# Patient Record
Sex: Male | Born: 1935 | Race: White | Hispanic: No | Marital: Married | Smoking: Former smoker
Health system: Southern US, Community
[De-identification: ages and names within clinical notes are randomized; demographics above are authoritative.]

## PROBLEM LIST (undated history)

## (undated) DIAGNOSIS — Z923 Personal history of irradiation: Secondary | ICD-10-CM

## (undated) DIAGNOSIS — Z5112 Encounter for antineoplastic immunotherapy: Secondary | ICD-10-CM

## (undated) DIAGNOSIS — C419 Malignant neoplasm of bone and articular cartilage, unspecified: Secondary | ICD-10-CM

## (undated) DIAGNOSIS — I10 Essential (primary) hypertension: Secondary | ICD-10-CM

## (undated) DIAGNOSIS — G834 Cauda equina syndrome: Secondary | ICD-10-CM

## (undated) DIAGNOSIS — C3490 Malignant neoplasm of unspecified part of unspecified bronchus or lung: Principal | ICD-10-CM

## (undated) HISTORY — DX: Cauda equina syndrome: G83.4

## (undated) HISTORY — DX: Malignant neoplasm of unspecified part of unspecified bronchus or lung: C34.90

## (undated) HISTORY — DX: Encounter for antineoplastic immunotherapy: Z51.12

## (undated) HISTORY — DX: Essential (primary) hypertension: I10

## (undated) HISTORY — DX: Malignant neoplasm of bone and articular cartilage, unspecified: C41.9

---

## 2004-08-11 ENCOUNTER — Ambulatory Visit (HOSPITAL_COMMUNITY): Admission: RE | Admit: 2004-08-11 | Discharge: 2004-08-11 | Payer: Self-pay | Admitting: General Surgery

## 2006-09-04 ENCOUNTER — Ambulatory Visit: Payer: Self-pay | Admitting: Cardiology

## 2006-09-13 ENCOUNTER — Ambulatory Visit: Payer: Self-pay

## 2006-10-18 ENCOUNTER — Ambulatory Visit: Payer: Self-pay | Admitting: Cardiology

## 2010-10-12 NOTE — Assessment & Plan Note (Signed)
[facility]                            CARDIOLOGY OFFICE NOTE   TYRECK, BELL                          MRN:          [id number]  DATE:10/18/2006                            DOB:          Nov 21, 1934    Mr. Logan Russell is a 76 year old gentleman that I recently saw on September 04, 2006 secondary to atypical chest pain.  We scheduled him to have a  Myoview which was performed on September 13, 2006.  There appeared to be  minimal diaphragmatic attenuation but no significant ischemia.  His  ejection fraction was 60%.  He had an abdominal ultrasound that showed  no significant aneurysm.  He had carotid Dopplers that showed a 0%-39%  bilateral internal carotid artery stenosis.  Since that time he  continues to have an occasional pain in his chest that lasts for  seconds.  It is not exertional nor is it related to food.  There is no  associated symptoms.  Note, he does not have exertional chest pain.  He  also denies any dyspnea or pedal edema.  He has not taken his atenolol  in several days as he states he ran out.   MEDICATIONS:  1. Hydrochlorothiazide 25 mg p.o. daily.  2. Aspirin 81 mg p.o. daily.   PHYSICAL EXAMINATION:  Blood pressure of 170/95 and his pulse is 82.  He  weighs 208 pounds.  His HEENT is normal.  His neck is supple.  His chest is clear.  His cardiovascular exam reveals a regular rate and rhythm.  His abdominal exam is benign.  His extremities show no edema.   DIAGNOSES:  1. Atypical chest pain:  He does have persistent symptoms that last      for seconds, but these do not sound to be cardiac.  His Myoview      shows no ischemia.  I, therefore, do not think we need to pursue      further cardiac workup.  2. Hypertension:  His blood pressure is elevated today but he is not      taking his atenolol.  We will resume atenolol at 50 mg p.o. daily.      I have asked him to follow up with Dr. Jeannetta Nap concerning this      issue.  If it is not  adequately controlled on the above, atenolol      and hydrochlorothiazide, then an angiotensin-converting enzyme      inhibitor can be added.  3. History of mild hyperlipidemia:  Per Dr. Jeannetta Nap.  4. Tobacco abuse:  We, again, discussed the importance of      discontinuing this.  5. Risk factor modification:  He needs to continue with diet and      exercise.  I will see him back on an as-needed basis.     Madolyn Frieze Jens Som, MD, [facility]  Electronically Signed    BSC/MedQ  DD: 10/18/2006  DT: 10/18/2006  Job #: [id number]   cc:   Windle Guard, M.D.

## 2010-10-15 NOTE — Assessment & Plan Note (Signed)
[facility]                            CARDIOLOGY OFFICE NOTE   Logan Russell, Logan Russell                          MRN:          [id number]  DATE:09/04/2006                            DOB:          11/25/1934    Mr. Borton is a 76 year old male with a past medical history of  hypertension, hyperlipidemia, tobacco abuse, who we were asked to  evaluate for chest pain.  He has no prior cardiac history.  For the past  several years, he has been having occasional chest pain in the left  chest area.  He finds it difficult to describe.  He also has a tingling  in his left upper extremity with his pain.  There is no associated  shortness of breath, nausea, vomiting, or diaphoresis.  It is not  pleuritic or positional nor is it related to food.  It is not  exertional.  It typically lasts for 5 minutes and resolves  spontaneously.  He saw Dr. Jeannetta Nap recently and described his pain and we  were asked to further evaluate.   Note:  He does not have exertional chest pain, although there is mild  dyspnea on exertion.  There is no orthopnea, PND, or pedal edema.   MEDICATIONS:  1. Hydrochlorothiazide 25 mg p.o. daily.  2. Atenolol 50 mg p.o. daily.  3. Aspirin 325 mg p.o. daily.   ALLERGIES:  No known drug allergies.   SOCIAL HISTORY:  He has a long history of tobacco use, smoking one pack  per day for 50 years.  He consumes 1-2 beers per night.   FAMILY HISTORY:  Positive for coronary artery disease in his father.   PAST MEDICAL HISTORY:  Significant for hypertension and he states that  he has had mild hyperlipidemia in the past, although, he does not recall  the numbers.  There is no diabetes mellitus.  He denies any lung disease  or prior cardiac disease.  He has had surgery for a perirectal abscess.   REVIEW OF SYSTEMS:  He denies any headaches, fevers, or chills.  He does  have a cough, but attributes this to a recent URI.  There is no  hemoptysis.  There is no  dysphagia, odynophagia, melena, or  hematochezia.  There is no dysuria, hematuria, or seizure activity.  There is no orthopnea, PND, or pedal edema.  He does have some pain in  his right hip and knee with ambulation, but there is no claudication.  The remaining systems are negative.   PHYSICAL EXAMINATION:  VITAL SIGNS:  Blood pressure 139/85 and pulse 63.  GENERAL:  He is well-developed and well-nourished in no acute distress.  SKIN:  Warm and dry.  NEUROLOGY:  He is not depressed and there is no peripheral clubbing.  BACK:  Normal.  HEENT:  Unremarkable with normal eye lids.  NECK:  Supple with a normal upstroke bilaterally.  There are soft  bilateral carotid bruits.  There is no jugular venous distention, no  thyromegaly.  CHEST:  Mild rhonchi and expiratory wheezing, but is otherwise clear  with normal expansion.  CARDIOVASCULAR:  Regular rate and rhythm with normal S1 and S2.  There  are no murmurs, rubs, or gallops noted.  His PMI is nondisplaced.  ABDOMEN:  Nontender, positive bowel sounds.  No hepatosplenomegaly and  no mass appreciated.  There is a soft mid abdominal bruit.  There are no  masses palpated.  He has 2+ femoral pulses bilaterally and no bruits.  EXTREMITIES:  No edema and I can palpate no cords.  He has 2+ posterior  tibial pulses bilaterally.  NEUROLOGY:  Grossly intact.   His electrocardiogram shows a sinus rhythm at a rate of 61.  There are  no ST changes noted.   DIAGNOSES:  1. Atypical chest pain.  Mr. Sonneborn presents with chest pain of      uncertain etiology.  His electrocardiogram is normal and his      symptoms are not classic for coronary artery disease.  However, he      has multiple risk factors including tobacco abuse, positive family      history, hypertension, and hyperlipidemia.  We will plan to risk      stratify with an Adenosine Myoview.  If he shows normal perfusion      then he will just need to continue with risk factor modification.       He will continue on his aspirin and beta blocker.  2. Hypertension.  We will continue with his Atenolol and      hydrochlorothiazide and this will need to be followed closely in      the future.  If his blood pressure is not well controlled, then an      ACE inhibitor could be added, but I will leave this to Dr. Jeannetta Nap.  3. History of mild hyperlipidemia.  Given his multiple risk factors,      he would most likely benefit from a statin in the future, but I      again will leave this to Dr. Jeannetta Nap.  4. Tobacco abuse.  We discussed the importance of discontinuing this.  5. Carotid and abdominal bruit.  We will schedule him to have carotid      Dopplers as well as an abdominal ultrasound to exclude aneurysm.   We will see him back in 6 weeks to review the above information.     Madolyn Frieze Jens Som, MD, [facility]  Electronically Signed    BSC/MedQ  DD: 09/04/2006  DT: 09/04/2006  Job #: [id number]   cc:   Windle Guard, M.D.

## 2010-10-15 NOTE — Op Note (Signed)
NAME:  Logan Russell, Logan Russell NO.:  [id number]   MEDICAL RECORD NO.:  [id number]          PATIENT TYPE:  AMB   LOCATION:  DAY                          FACILITY:  [facility]   PHYSICIAN:  Ollen Gross. Vernell Morgans, M.D. DATE OF BIRTH:  12-Jun-1934   DATE OF PROCEDURE:  08/11/2004  DATE OF DISCHARGE:                                 OPERATIVE REPORT   PREOPERATIVE DIAGNOSIS:  Recurrent perirectal abscess and possible fistula.   POSTOPERATIVE DIAGNOSIS:  Recurrent perirectal abscess and anal fistula.   PROCEDURE:  Incision and drainage of abscess with debridement of the fistula  tract and placement of a seton.   SURGEON:  Ollen Gross. Carolynne Edouard, M.D.   ANESTHESIA:  General via LMA.   PROCEDURE:  After informed consent was obtained, the patient was brought to  the operating room and placed in a supine position on the operating room  table.  After adequate induction of general anesthesia, the patient was  placed in the lithotomy position.  His perirectal area was then prepped with  Betadine and draped in the usual sterile manner.  The external opening of  the previous recurrent abscess sites was easily to identify.  This site was  probed with a thin silver probe.  A fistula tract that tracked towards the  midline anteriorly was identified.  This fistula tract was opened  externally, somewhat sharply, with the electrocautery as well as opened a  little bit internally, also with the electrocautery.  The whole area was  then infiltrated with 0.25% Marcaine with epinephrine.  The fistula tract  itself appeared to go deep to some of the sphincter muscles and was  therefore debrided with a curette.  A blue vesseloops was then brought  through the fistula tract with the hemostat and cinched up snug to the  bridge of tissue and anchored there with a 0 Vicryl tie. The open wound  externally was packed with gauze, and the bleeding was controlled with the  electrocautery.  The seton was left in place.   Sterile dressings were then  applied.  The patient tolerated the procedure well.  At the end of the case,  all needle, sponge, and instrument counts were correct.  Patient was then  awakened and taken to the recovery room in stable condition.      PST/MEDQ  D:  08/11/2004  T:  08/11/2004  Job:  [id number]

## 2011-07-06 ENCOUNTER — Other Ambulatory Visit: Payer: Self-pay | Admitting: Family Medicine

## 2011-07-06 DIAGNOSIS — Z136 Encounter for screening for cardiovascular disorders: Secondary | ICD-10-CM

## 2011-07-08 ENCOUNTER — Ambulatory Visit
Admission: RE | Admit: 2011-07-08 | Discharge: 2011-07-08 | Disposition: A | Discharge status: Home / Self Care | Payer: Medicare Other | Source: Ambulatory Visit | Attending: Family Medicine | Admitting: Family Medicine

## 2011-07-08 DIAGNOSIS — Z136 Encounter for screening for cardiovascular disorders: Secondary | ICD-10-CM

## 2012-03-16 ENCOUNTER — Other Ambulatory Visit: Payer: Self-pay | Admitting: Family Medicine

## 2012-03-16 DIAGNOSIS — J189 Pneumonia, unspecified organism: Secondary | ICD-10-CM

## 2012-03-20 ENCOUNTER — Ambulatory Visit
Admission: RE | Admit: 2012-03-20 | Discharge: 2012-03-20 | Disposition: A | Discharge status: Home / Self Care | Payer: Medicare Other | Source: Ambulatory Visit | Attending: Family Medicine | Admitting: Family Medicine

## 2012-03-20 DIAGNOSIS — J189 Pneumonia, unspecified organism: Secondary | ICD-10-CM

## 2012-03-21 ENCOUNTER — Other Ambulatory Visit (HOSPITAL_COMMUNITY): Payer: Self-pay | Admitting: Family Medicine

## 2012-03-21 DIAGNOSIS — C3432 Malignant neoplasm of lower lobe, left bronchus or lung: Secondary | ICD-10-CM

## 2012-03-21 DIAGNOSIS — C3490 Malignant neoplasm of unspecified part of unspecified bronchus or lung: Secondary | ICD-10-CM

## 2012-03-28 ENCOUNTER — Encounter: Payer: Self-pay | Admitting: *Deleted

## 2012-03-28 ENCOUNTER — Encounter (HOSPITAL_COMMUNITY)
Admission: RE | Admit: 2012-03-28 | Discharge: 2012-03-28 | Disposition: A | Discharge status: Home / Self Care | Payer: Medicare Other | Source: Ambulatory Visit | Attending: Family Medicine | Admitting: Family Medicine

## 2012-03-28 DIAGNOSIS — Y849 Medical procedure, unspecified as the cause of abnormal reaction of the patient, or of later complication, without mention of misadventure at the time of the procedure: Secondary | ICD-10-CM | POA: Insufficient documentation

## 2012-03-28 DIAGNOSIS — R599 Enlarged lymph nodes, unspecified: Secondary | ICD-10-CM | POA: Insufficient documentation

## 2012-03-28 DIAGNOSIS — N400 Enlarged prostate without lower urinary tract symptoms: Secondary | ICD-10-CM | POA: Insufficient documentation

## 2012-03-28 DIAGNOSIS — J90 Pleural effusion, not elsewhere classified: Secondary | ICD-10-CM | POA: Insufficient documentation

## 2012-03-28 DIAGNOSIS — C3432 Malignant neoplasm of lower lobe, left bronchus or lung: Secondary | ICD-10-CM

## 2012-03-28 DIAGNOSIS — R222 Localized swelling, mass and lump, trunk: Secondary | ICD-10-CM | POA: Insufficient documentation

## 2012-03-28 DIAGNOSIS — J988 Other specified respiratory disorders: Secondary | ICD-10-CM | POA: Insufficient documentation

## 2012-03-28 DIAGNOSIS — N289 Disorder of kidney and ureter, unspecified: Secondary | ICD-10-CM | POA: Insufficient documentation

## 2012-03-28 DIAGNOSIS — J9819 Other pulmonary collapse: Secondary | ICD-10-CM | POA: Insufficient documentation

## 2012-03-28 MED ORDER — FLUDEOXYGLUCOSE F - 18 (FDG) INJECTION
16.2000 | Freq: Once | INTRAVENOUS | Status: AC | PRN
  Administered 2012-03-28: 16.2 via INTRAVENOUS

## 2012-03-29 ENCOUNTER — Institutional Professional Consult (permissible substitution) (INDEPENDENT_AMBULATORY_CARE_PROVIDER_SITE_OTHER): Payer: Medicare Other | Admitting: Cardiothoracic Surgery

## 2012-03-29 ENCOUNTER — Encounter: Payer: Self-pay | Admitting: Cardiothoracic Surgery

## 2012-03-29 VITALS — BP 200/113 | HR 51 | Resp 20 | Ht 68.5 in | Wt 195.0 lb

## 2012-03-29 DIAGNOSIS — R918 Other nonspecific abnormal finding of lung field: Secondary | ICD-10-CM

## 2012-03-29 DIAGNOSIS — R591 Generalized enlarged lymph nodes: Secondary | ICD-10-CM

## 2012-03-29 DIAGNOSIS — R222 Localized swelling, mass and lump, trunk: Secondary | ICD-10-CM

## 2012-03-29 DIAGNOSIS — R599 Enlarged lymph nodes, unspecified: Secondary | ICD-10-CM

## 2012-03-29 NOTE — Progress Notes (Signed)
[address]            [place] [address]          [phone]      Logan Russell [facility] Medical Record #[id number] Date of Birth: Nov 17, 1934  Referring: [name], * Primary Care: [name], MD  Chief Complaint:    Chief Complaint  Patient presents with  . Lung Mass    MTOC referral for eval and treatment...CT CHEST/PET...left lower lobe and right perihilar region masses  . Lymphadenopathy     Right supraclavicular and mediastinal    History of Present Illness:          Current Activity/ Functional Status: Patient is independent with mobility/ambulation, transfers, ADL's, IADL's.   Past Medical History  Diagnosis Date  . Hypertension     No past surgical history on file.  Family History  Problem Relation Age of Onset  . Heart disease Father     History   Social History  . Marital Status: Married    Spouse Name: N/A    Number of Children: N/A  . Years of Education: N/A   Occupational History  . Not on file.   Social History Main Topics  . Smoking status: Former Smoker    Quit date: 12/28/2011  . Smokeless tobacco: Not on file  . Alcohol Use: No  . Drug Use: Not on file  . Sexually Active: Not on file   Other Topics Concern  . Not on file   Social History Narrative  . No narrative on file    History  Smoking status  . Former Smoker  . Quit date: 12/28/2011  Smokeless tobacco  . Not on file    History  Alcohol Use No     No Known Allergies  Current Outpatient Prescriptions  Medication Sig Dispense Refill  . promethazine-codeine (PHENERGAN WITH CODEINE) 6.25-10 MG/5ML syrup Take 5 mLs by mouth every 4 (four) hours as needed.      . triamterene-hydrochlorothiazide (MAXZIDE) 75-50 MG per tablet Take 1 tablet by mouth daily.           Review of Systems:     Cardiac Review of Systems: Y or N  Chest Pain [  n  ]  Resting SOB Cove.Etienne   ] Exertional SOB  [ y ]  Orthopnea [ n  ]   Pedal Edema [ n  ]    Palpitations [n  ] Syncope  [ n ]   Presyncope [ n  ]  General Review of Systems: [Y] = yes [  ]=no Constitional: recent weight change Milo.Brash  ]; anorexia [ n ]; fatigue Cove.Etienne  ]; nausea [n  ]; night sweats [ n ]; fever [n  ]; or chills [ n ];  Dental: poor dentition[n  ];   Eye : blurred vision [  ]; diplopia [   ]; vision changes [  ];  Amaurosis fugax[  ]; Resp: cough Cove.Etienne  ];  wheezing[ y ];  hemoptysis[ n ]; shortness of breath[ y ]; paroxysmal nocturnal dyspnea[  ]; dyspnea on exertion[  ]; or orthopnea[  ];  GI:  gallstones[  ], vomiting[  ];  dysphagia[  ]; melena[  ];  hematochezia [  ]; heartburn[  ];   Hx of  Colonoscopy[  ]; GU: kidney stones [  ]; hematuria[  ];   dysuria [  ];  nocturia[  ];  history of     obstruction [  ];             Skin: rash, swelling[  ];, hair loss[  ];  peripheral edema[  ];  or itching[  ]; Musculosketetal: myalgias[  ];  joint swelling[  ];  joint erythema[  ];  joint pain[  ];  back pain[  ];  Heme/Lymph: bruising[  ];  bleeding[  ];  anemia[  ];  Neuro: TIA[  ];  headaches[  ];  stroke[  ];  vertigo[  ];  seizures[  ];   paresthesias[  ];  difficulty walking[  ];  Psych:depression[  ]; anxiety[  ];  Endocrine: diabetes[  ];  thyroid dysfunction[  ];  Immunizations: Flu Milo.Brash  ]; Pneumococcal[n  ];  Other:  Physical Exam: BP 200/113  Pulse 51  Resp 20  Ht 5' 8.5" (1.74 m)  Wt 195 lb (88.451 kg)  BMI 29.22 kg/m2  SpO2 94%  General appearance: alert, cooperative, appears older than stated age, fatigued and no distress Neurologic: intact Heart: regular rate and rhythm, S1, S2 normal, no murmur, click, rub or gallop and normal apical impulse Lungs: diminished breath sounds bibasilar Abdomen: soft, non-tender; bowel sounds normal; no masses,  no organomegaly Extremities: extremities normal,  atraumatic, no cyanosis or edema, Homans sign is negative, no sign of DVT and no edema, redness or tenderness in the calves or thighs do not feel axillary or cervical nodes    Diagnostic Studies & Laboratory data:     Recent Radiology Findings:   Nm Pet Image Initial (pi) Skull Base To Thigh  03/28/2012  *RADIOLOGY REPORT*  Clinical Data: Initial treatment strategy for lung mass.  NUCLEAR MEDICINE PET SKULL BASE TO THIGH  Fasting Blood Glucose:  94  Technique:  16.2 mCi F-18 FDG was injected intravenously. CT data was obtained and used for attenuation correction and anatomic localization only.  (This was not acquired as a diagnostic CT examination.) Additional exam technical data entered on technologist worksheet.  Comparison:  CT chest 03/20/2012.  Findings:  Neck: Right supraclavicular lymph nodes measure up to 1.5 cm in short axis with an S U V max of 6.0.  No additional areas of abnormal metabolism in the neck.  CT images show no acute findings.  Chest:  Extensive mediastinal and hilar adenopathy is seen, all of which is hypermetabolic.  Index right paratracheal lymph node measures 1.6 cm and has an S U V max of 8.0 (CT image 82, PET image 81).  Right hilar mass is poorly measured in the absence of IV contrast. S U V max is 11.4. Associated postobstructive collapse/consolidation of the right middle lobe.  Low left paratracheal lymph node measures 1.4 cm with an S U V max of 9.5 (CT image 88, PET image 87).  Right hilar lymph  nodes are hypermetabolic but poorly measured due to lack of IV contrast.  S U V max is 6.7.  A spiculated left lower lobe mass measures 3.1 x 3.4 cm with an S U V max of 9.9.  No additional areas of abnormal hypermetabolism in the chest.  CT images show a new tiny right pleural effusion.  No pericardial effusion.  Abdomen/Pelvis:  No abnormal hypermetabolic activity within the liver, pancreas, adrenal glands, or spleen.  No hypermetabolic lymph nodes in the abdomen or pelvis. CT  images show no acute findings.  A 1.7 cm low attenuation lesion is seen in the interpolar left kidney.  Atherosclerotic calcification of the arterial vasculature without abdominal aortic aneurysm.  No free fluid.  Prostate is mildly enlarged.  Skeleton:  No focal hypermetabolic activity to suggest skeletal metastasis.  IMPRESSION:  1.  Hypermetabolic right hilar and left lower lobe masses with extensive bilateral  mediastinal, hilar and right supraclavicular adenopathy, most consistent with primary bronchogenic carcinoma. 2.  Postobstructive collapse of the right middle lobe. 3.  Consultation with the [facility] [phone]) should be considered. 4.  New tiny right pleural effusion.   Original Report Authenticated By: Reyes Ivan, M.D.       Recent Lab Findings: No results found for this basename: WBC, HGB, HCT, PLT, GLUCOSE, CHOL, TRIG, HDL, LDLDIRECT, LDLCALC, ALT, AST, NA, K, CL, CREATININE, BUN, CO2, TSH, INR, GLUF, HGBA1C      Assessment / Plan:   Significant  Hypertension, patient did not take BP meds today instructed to take as soon as gets them and continue  right hilar and left lower lobe masses with extensive bilateral  mediastinal, hilar and right supraclavicular adenopathy, most consistent with primary bronchogenic carcinoma.   Have discussed pet scan findings  And prob dx of Lung cancer, will proceed first with needle bx of the left lung lesion. Then return to [facility] clinic to discuss treatemnt options after path confirmed    Delight Ovens MD  Beeper [phone] Office [phone] 03/29/2012 9:52 PM

## 2012-03-30 ENCOUNTER — Other Ambulatory Visit: Payer: Self-pay | Admitting: Cardiothoracic Surgery

## 2012-03-30 DIAGNOSIS — D381 Neoplasm of uncertain behavior of trachea, bronchus and lung: Secondary | ICD-10-CM

## 2012-04-03 ENCOUNTER — Other Ambulatory Visit: Payer: Self-pay | Admitting: Radiology

## 2012-04-04 ENCOUNTER — Encounter (HOSPITAL_COMMUNITY): Payer: Self-pay

## 2012-04-04 ENCOUNTER — Ambulatory Visit (HOSPITAL_COMMUNITY)
Admission: RE | Admit: 2012-04-04 | Discharge: 2012-04-04 | Disposition: A | Discharge status: Home / Self Care | Payer: Medicare Other | Source: Ambulatory Visit | Attending: Cardiothoracic Surgery | Admitting: Cardiothoracic Surgery

## 2012-04-04 ENCOUNTER — Ambulatory Visit (HOSPITAL_COMMUNITY)
Admission: RE | Admit: 2012-04-04 | Discharge: 2012-04-04 | Disposition: A | Discharge status: Home / Self Care | Payer: Medicare Other | Source: Ambulatory Visit | Attending: Interventional Radiology | Admitting: Interventional Radiology

## 2012-04-04 DIAGNOSIS — D381 Neoplasm of uncertain behavior of trachea, bronchus and lung: Secondary | ICD-10-CM

## 2012-04-04 LAB — CBC
MCH: 30.7 pg (ref 26.0–34.0)
MCV: 91.1 fL (ref 78.0–100.0)
RBC: 4.95 MIL/uL (ref 4.22–5.81)
WBC: 10.3 10*3/uL (ref 4.0–10.5)

## 2012-04-04 LAB — APTT: aPTT: 30 seconds (ref 24–37)

## 2012-04-04 MED ORDER — MIDAZOLAM HCL 2 MG/2ML IJ SOLN
INTRAMUSCULAR | Status: AC | PRN
  Administered 2012-04-04: 2 mg via INTRAVENOUS

## 2012-04-04 MED ORDER — FENTANYL CITRATE 0.05 MG/ML IJ SOLN
INTRAMUSCULAR | Status: AC
  Filled 2012-04-04: qty 2

## 2012-04-04 MED ORDER — SODIUM CHLORIDE 0.9 % IV SOLN
Freq: Once | INTRAVENOUS | Status: AC
  Administered 2012-04-04: 08:00:00 via INTRAVENOUS

## 2012-04-04 MED ORDER — MIDAZOLAM HCL 2 MG/2ML IJ SOLN
INTRAMUSCULAR | Status: AC
  Filled 2012-04-04: qty 4

## 2012-04-04 MED ORDER — FENTANYL CITRATE 0.05 MG/ML IJ SOLN
INTRAMUSCULAR | Status: AC | PRN
  Administered 2012-04-04: 50 ug via INTRAVENOUS

## 2012-04-04 MED ORDER — HYDROCODONE-ACETAMINOPHEN 5-325 MG PO TABS
1.0000 | ORAL_TABLET | ORAL | Status: DC | PRN
  Filled 2012-04-04: qty 2

## 2012-04-04 NOTE — Procedures (Signed)
Successful CT fluoro guided LLL MASS 18 G CORE BXS No comp Stable Path pending Full report in PACS

## 2012-04-04 NOTE — H&P (Signed)
Logan Russell is an 77 y.o. male.   Chief Complaint: cough for weeks and CXR revealed left lung mass CT confirms and has been sent for biopsy now HPI: HTN; cough; stopped smoking x 4 mo  Past Medical History  Diagnosis Date  . Hypertension     No past surgical history on file.  Family History  Problem Relation Age of Onset  . Heart disease Father    Social History:  reports that he quit smoking about 3 months ago. He does not have any smokeless tobacco history on file. He reports that he does not drink alcohol. His drug history not on file.  Allergies: No Known Allergies   (Not in a hospital admission)  Results for orders placed during the hospital encounter of 04/04/12 (from the past 48 hour(s))  APTT     Status: Normal   Collection Time   04/04/12  7:48 AM      Component Value Range Comment   aPTT 30  24 - 37 seconds   CBC     Status: Normal   Collection Time   04/04/12  7:48 AM      Component Value Range Comment   WBC 10.3  4.0 - 10.5 K/uL    RBC 4.95  4.22 - 5.81 MIL/uL    Hemoglobin 15.2  13.0 - 17.0 g/dL    HCT 19.1  47.8 - 29.5 %    MCV 91.1  78.0 - 100.0 fL    MCH 30.7  26.0 - 34.0 pg    MCHC 33.7  30.0 - 36.0 g/dL    RDW 62.1  30.8 - 65.7 %    Platelets 255  150 - 400 K/uL   PROTIME-INR     Status: Normal   Collection Time   04/04/12  7:48 AM      Component Value Range Comment   Prothrombin Time 13.2  11.6 - 15.2 seconds    INR 1.01  0.00 - 1.49    No results found.  Review of Systems  Constitutional: Negative for fever and weight loss.  Respiratory: Positive for cough and sputum production.   Cardiovascular: Negative for chest pain.  Gastrointestinal: Negative for nausea, vomiting and abdominal pain.  Neurological: Negative for weakness and headaches.    Blood pressure 167/89, pulse 94, temperature 97.2 F (36.2 C), temperature source Oral, resp. rate 18, height 5' 8.5" (1.74 m), weight 195 lb (88.451 kg), SpO2 98.00%. Physical Exam    Constitutional: He is oriented to person, place, and time.  Cardiovascular: Normal rate, regular rhythm and normal heart sounds.   No murmur heard. Respiratory: Effort normal. No respiratory distress. He has wheezes. He has rales.  GI: Soft. Bowel sounds are normal. There is no tenderness.  Musculoskeletal: Normal range of motion.  Neurological: He is alert and oriented to person, place, and time.  Psychiatric: He has a normal mood and affect. His behavior is normal. Judgment and thought content normal.     Assessment/Plan Cough x weeks; CXR and CT show left lung mass Scheduled now for bx Pt aware of procedure benefits and risks and agreeable to proceed Consent signed and in chart  Regan Llorente A 04/04/2012, 9:10 AM

## 2012-04-04 NOTE — Progress Notes (Signed)
CALLED DR SHICK TO CHECK ON FOLLOWUP CXR AND PER DR SHICK OK TO D/C HOME

## 2012-04-05 ENCOUNTER — Telehealth (HOSPITAL_COMMUNITY): Payer: Self-pay | Admitting: *Deleted

## 2012-04-05 NOTE — Telephone Encounter (Signed)
Radiology post procedure, lung biopsy phone call.  Per wife, pt doing well no problems or questions.

## 2012-04-09 ENCOUNTER — Telehealth: Payer: Self-pay | Admitting: *Deleted

## 2012-04-09 NOTE — Telephone Encounter (Signed)
Spoke with pt wife regarding appt with Dr. Tyrone Sage 04/12/12 at 1:15 and Dr. Arbutus Ped at 3:00 at [facility] office.  She verbalized understanding of time and place of appt

## 2012-04-12 ENCOUNTER — Ambulatory Visit (HOSPITAL_BASED_OUTPATIENT_CLINIC_OR_DEPARTMENT_OTHER): Payer: Medicare Other | Admitting: Internal Medicine

## 2012-04-12 ENCOUNTER — Other Ambulatory Visit: Payer: Self-pay | Admitting: Cardiothoracic Surgery

## 2012-04-12 ENCOUNTER — Encounter: Payer: Self-pay | Admitting: Radiation Oncology

## 2012-04-12 ENCOUNTER — Encounter: Payer: Self-pay | Admitting: Internal Medicine

## 2012-04-12 ENCOUNTER — Ambulatory Visit (INDEPENDENT_AMBULATORY_CARE_PROVIDER_SITE_OTHER): Payer: Medicare Other | Admitting: Cardiothoracic Surgery

## 2012-04-12 ENCOUNTER — Encounter: Payer: Self-pay | Admitting: Cardiothoracic Surgery

## 2012-04-12 ENCOUNTER — Encounter (INDEPENDENT_AMBULATORY_CARE_PROVIDER_SITE_OTHER): Payer: Medicare Other

## 2012-04-12 ENCOUNTER — Encounter: Payer: Self-pay | Admitting: *Deleted

## 2012-04-12 ENCOUNTER — Ambulatory Visit
Admission: RE | Admit: 2012-04-12 | Discharge: 2012-04-12 | Disposition: A | Discharge status: Home / Self Care | Payer: Medicare Other | Source: Ambulatory Visit | Attending: Radiation Oncology | Admitting: Radiation Oncology

## 2012-04-12 VITALS — BP 157/92 | HR 100 | Temp 97.9°F | Resp 20 | Ht 68.5 in | Wt 195.0 lb

## 2012-04-12 DIAGNOSIS — R599 Enlarged lymph nodes, unspecified: Secondary | ICD-10-CM

## 2012-04-12 DIAGNOSIS — R591 Generalized enlarged lymph nodes: Secondary | ICD-10-CM

## 2012-04-12 DIAGNOSIS — C7931 Secondary malignant neoplasm of brain: Secondary | ICD-10-CM

## 2012-04-12 DIAGNOSIS — C3490 Malignant neoplasm of unspecified part of unspecified bronchus or lung: Secondary | ICD-10-CM

## 2012-04-12 DIAGNOSIS — C3430 Malignant neoplasm of lower lobe, unspecified bronchus or lung: Secondary | ICD-10-CM

## 2012-04-12 HISTORY — DX: Malignant neoplasm of unspecified part of unspecified bronchus or lung: C34.90

## 2012-04-12 MED ORDER — PROCHLORPERAZINE MALEATE 10 MG PO TABS: 10.0000 mg | ORAL_TABLET | Freq: Four times a day (QID) | ORAL | Status: DC | PRN

## 2012-04-12 NOTE — Progress Notes (Signed)
[facility]  Clinical Social Work  Clinical Social Work met with patient, patient's spouse, Systems developer, thoracic navigator, and pharmD resident during [facility] today. MD discussed patient's diagnosis and treatment plan and patient was agreeable to treatment.  Patient lives in [place] [address] and was somewhat concerned about difficulty of traveling to [place] daily for radiation treatment. CSW encouraged patient to contact CSW for support and resources once schedule has been established.  Kathrin Penner, MSW, LCSW Clinical Social Worker [facility] [phone]

## 2012-04-12 NOTE — Progress Notes (Signed)
[address]            [place] [address]          [phone]       ANIRVIN SLOWE [facility] Medical Record #[id number] Date of Birth: 12/29/1934  Logan Russell, * Logan Mask, MD  Chief Complaint:   PostOp Follow Up Visit  Patient returns after having needle biopsy of left lung lesion.  History of Present Illness:     Patient notes that he feels some better with his respiratory status since last seen. He had a left lung needle biopsy which confirms adenocarcinoma with extensive mediastinal involvement.     History  Smoking status  . Former Smoker  . Quit date: 12/28/2011  Smokeless tobacco  . Not on file       No Known Allergies  Current Outpatient Prescriptions  Medication Sig Dispense Refill  . triamterene-hydrochlorothiazide (MAXZIDE) 75-50 MG per tablet Take 1 tablet by mouth daily.      . promethazine-codeine (PHENERGAN WITH CODEINE) 6.25-10 MG/5ML syrup Take 5 mLs by mouth every 4 (four) hours as needed.           Physical Exam: BP 157/92  Pulse 100  Temp 97.9 F (36.6 C) (Oral)  Resp 20  Ht 5' 8.5" (1.74 m)  Wt 195 lb (88.451 kg)  BMI 29.22 kg/m2  SpO2 95%  General appearance: alert, cooperative and no distress Neurologic: intact Heart: regular rate and rhythm, S1, S2 normal, no murmur, click, rub or gallop and normal apical impulse Lungs: diminished breath sounds bilaterally Abdomen: soft, non-tender; bowel sounds normal; no masses,  no organomegaly Extremities: extremities normal, atraumatic, no cyanosis or edema and Homans sign is negative, no sign of DVT Wounds:  Diagnostic Studies & Laboratory data:         Recent Radiology Findings: Dg Chest 1 View  04/04/2012  *RADIOLOGY REPORT*  Clinical Data: Left lower lobe mass biopsy  CHEST - 1 VIEW  Comparison: 04/04/2012 CT images  Findings: Left lower lobe mass again noted.  No significant pneumothorax post biopsy.  The right hilar  prominence with postobstructive atelectasis is again noted.  IMPRESSION: No pneumothorax post biopsy.   Original Report Authenticated By: Jearld Lesch, M.D.    Ct Chest Wo Contrast  03/20/2012  **ADDENDUM** CREATED: 03/20/2012 13:59:21  Prior chest radiographs dated 03/16/2012 have been digitized for comparison.  The patchy left lower lobe and right mid/lower lung opacities are grossly unchanged when compared to the current CT topogram.  These findings remain worrisome for neoplasm, less likely infection.  **END ADDENDUM** SIGNED BY: Charline Bills, M.D.   03/20/2012  *RADIOLOGY REPORT*  Clinical Data: Recent pneumonia, cough, shortness of breath  CT CHEST WITHOUT CONTRAST  Technique:  Multidetector CT imaging of the chest was performed following the standard protocol without IV contrast.  Comparison: Chest radiographs dated 08/06/2004.  Findings: Evaluation of the right perihilar region is constrained by lack of intravenous contrast.  However, there is suspected abnormal soft tissue/nodes in this region, worrisome for neoplasm given associated findings, less likely infection.  The right middle lobe bronchus is extrinsically compressed with associated right middle lobe collapse.  Mild ground- glass opacity in the central right lower lobe is worrisome for lymphangitic spread of tumor or less likely infection (series 3/image 36).  Spiculated 2.8 x 3.4 cm mass in the left lower lobe (  series 3/image 36), worrisome for synchronous primary bronchogenic neoplasm, less likely metastasis or infection.  6 mm right lower lobe nodule (series 3/image 25).  9 mm right lower lobe nodule (series 3/image 43).  Underlying mild emphysematous changes. No pleural effusion or pneumothorax.  Associated additional lymphadenopathy, including: --1.4 cm short axis right supraclavicular node (series 2/image 7) --1.4 cm short axis high right paratracheal node (series 2/image 20) --2.1 cm short axis precarinal node (series 2/image  26) --1.9 cm short axis subcarinal node (series 2/image 30)  A small left hilar node is suspected but is poorly evaluated (series 2/image 32).  Visualized thyroid is unremarkable.  The heart is normal in size.  No pericardial effusion.  Coronary atherosclerosis.  Atherosclerotic calcifications of the aortic arch.  Visualized upper abdomen is unremarkable.  Degenerative changes of the visualized thoracolumbar spine.  IMPRESSION: Evaluation is constrained by lack of intravenous contrast.  Abnormal soft tissue in the right perihilar region, highly suspicious for neoplasm/nodal metastases, less likely infection. Associated right supraclavicular and mediastinal lymphadenopathy, as described above.  Extrinsic compression of the right middle lobe bronchus with associated right middle lobe collapse.  3.4 cm spiculated mass in the left lower lobe, worrisome for synchronous primary bronchogenic neoplasm, less likely metastasis or infection.  Suspected small left hilar node.  Consider bronchoscopy for further evaluation.  Alternatively, the right supraclavicular node may be amenable to percutaneous biopsy.  These results were called by telephone on 03/20/2012 at 1158 hrs to Dr. Jeannetta Nap, who verbally acknowledged these results.  Original Report Authenticated By: Charline Bills, M.D.    Nm Pet Image Initial (pi) Skull Base To Thigh  03/28/2012  *RADIOLOGY REPORT*  Clinical Data: Initial treatment strategy for lung mass.  NUCLEAR MEDICINE PET SKULL BASE TO THIGH  Fasting Blood Glucose:  94  Technique:  16.2 mCi F-18 FDG was injected intravenously. CT data was obtained and used for attenuation correction and anatomic localization only.  (This was not acquired as a diagnostic CT examination.) Additional exam technical data entered on technologist worksheet.  Comparison:  CT chest 03/20/2012.  Findings:  Neck: Right supraclavicular lymph nodes measure up to 1.5 cm in short axis with an S U V max of 6.0.  No additional areas of  abnormal metabolism in the neck.  CT images show no acute findings.  Chest:  Extensive mediastinal and hilar adenopathy is seen, all of which is hypermetabolic.  Index right paratracheal lymph node measures 1.6 cm and has an S U V max of 8.0 (CT image 82, PET image 81).  Right hilar mass is poorly measured in the absence of IV contrast. S U V max is 11.4. Associated postobstructive collapse/consolidation of the right middle lobe.  Low left paratracheal lymph node measures 1.4 cm with an S U V max of 9.5 (CT image 88, PET image 87).  Right hilar lymph nodes are hypermetabolic but poorly measured due to lack of IV contrast.  S U V max is 6.7.  A spiculated left lower lobe mass measures 3.1 x 3.4 cm with an S U V max of 9.9.  No additional areas of abnormal hypermetabolism in the chest.  CT images show a new tiny right pleural effusion.  No pericardial effusion.  Abdomen/Pelvis:  No abnormal hypermetabolic activity within the liver, pancreas, adrenal glands, or spleen.  No hypermetabolic lymph nodes in the abdomen or pelvis. CT images show no acute findings.  A 1.7 cm low attenuation lesion is seen in the interpolar left kidney.  Atherosclerotic  calcification of the arterial vasculature without abdominal aortic aneurysm.  No free fluid.  Prostate is mildly enlarged.  Skeleton:  No focal hypermetabolic activity to suggest skeletal metastasis.  IMPRESSION:  1.  Hypermetabolic right hilar and left lower lobe masses with extensive bilateral  mediastinal, hilar and right supraclavicular adenopathy, most consistent with primary bronchogenic carcinoma. 2.  Postobstructive collapse of the right middle lobe. 3.  Consultation with the [facility] [phone]) should be considered. 4.  New tiny right pleural effusion.   Original Report Authenticated By: Reyes Ivan, M.D.    Ct Biopsy  04/04/2012  *RADIOLOGY REPORT*  Clinical Data: Left lower lobe 3.4 cm mass, PET positive, concerning  for malignancy  CT FLUOROSCOPY GUIDED LEFT LOWER LOBE MASS 18 GAUGE CORE BIOPSIES  Date:  04/04/2012 09:00:00  Radiologist:  M. Ruel Favors, M.D.  Medications:  2 mg Versed, 50 mcg Fentanyl  Guidance:  CT fluoroscopy  Fluoroscopy time:  15 seconds  Sedation time:  15 minutes  Contrast volume:  None.  Complications:  No immediate  PROCEDURE/FINDINGS:  Informed consent was obtained from the patient following explanation of the procedure, risks, benefits and alternatives. The patient understands, agrees and consents for the procedure. All questions were addressed.  A time out was performed.  Maximal barrier sterile technique utilized including caps, Russell, sterile gowns, sterile gloves, large sterile drape, hand hygiene, and betadine  Previous imaging reviewed.  The patient was positioned prone. Initial noncontrast localization CT performed through the chest. Left lower lobe mass-like opacity was localized.  This correlates with the PET scan.  Utilizing CT fluoroscopy, a 17 gauge 6.8 cm access needle was advanced from a posterior lateral approach into the lesion.  Needle position confirmed along the peripheral edge of the mass with CT fluoroscopy.  3  18 gauge core biopsies were obtained of the lesion.  These were placed in formalin.  Needle removed.  Post procedure imaging demonstrates no evidence of hemorrhage or hematoma.  The patient tolerated the procedure well.  IMPRESSION: Successful CT fluoroscopy guided left lower lobe mass 18 gauge core biopsies.   Original Report Authenticated By: Judie Petit. Miles Costain, M.D.     Recent Labs: Lab Results  Component Value Date   WBC 10.3 04/04/2012   HGB 15.2 04/04/2012   HCT 45.1 04/04/2012   PLT 255 04/04/2012   INR 1.01 04/04/2012      Assessment / Plan:      I reviewed with the patient and his wife the diagnosis of adenocarcinoma based on the needle biopsy, have explained to them with the extensive mediastinal involvement surgical resection does not play any role in  treatment. Dr. Arbutus Ped will see him today to discuss the treatment with chemotherapy and make a final decision about treatment.   Cyanna Neace B 04/12/2012 2:31 PM

## 2012-04-12 NOTE — Progress Notes (Signed)
Radiation Oncology         [phone] ________________________________  [facility] Initial outpatient Consultation  Name: Logan Russell MRN: [id number]  Date: 04/12/2012  DOB: Apr 01, 1935  WU:JWJXBJ,YNWGNF OLIVER, MD  Delight Ovens, MD   REFERRING PHYSICIAN: Delight Ovens, MD  DIAGNOSIS: 77 yo man with stage IIIB Non-small cell lung cancer  HISTORY OF PRESENT ILLNESS::Logan Russell is a 77 y.o. male who presented with cough.  CXR on 10/18 showed a left lower lung opacity.  Subsequent CT confirmed LLL mass, bilateral mediastinal adenopathy, and right supraclavicular adenopathy.  PET confirmed malignant activity at these sites, with no distant mets.  Brain scan was negative.  Biopsy showed Non-small cell carcinoma.  His case was presented at our [facility] conference and he presents today for multidisciplinary care.  PREVIOUS RADIATION THERAPY: No  PAST MEDICAL HISTORY:  has a past medical history of Hypertension and Lung cancer (04/12/2012).    PAST SURGICAL HISTORY:No past surgical history on file.  FAMILY HISTORY: family history includes Heart disease in his father.  SOCIAL HISTORY:  reports that he quit smoking about 3 months ago. He does not have any smokeless tobacco history on file. He reports that he does not drink alcohol.  ALLERGIES: Review of patient's allergies indicates no known allergies.  MEDICATIONS:  Current Outpatient Prescriptions  Medication Sig Dispense Refill  . prochlorperazine (COMPAZINE) 10 MG tablet Take 1 tablet (10 mg total) by mouth every 6 (six) hours as needed.  60 tablet  0  . promethazine-codeine (PHENERGAN WITH CODEINE) 6.25-10 MG/5ML syrup Take 5 mLs by mouth every 4 (four) hours as needed.      . triamterene-hydrochlorothiazide (MAXZIDE) 75-50 MG per tablet Take 1 tablet by mouth daily.        REVIEW OF SYSTEMS:  A 15 point review of systems is documented in the electronic medical record. This was obtained by the nursing staff. However,  I reviewed this with the patient to discuss relevant findings and make appropriate changes.  A comprehensive review of systems was negative.   PHYSICAL EXAM:  height is 5' 8.5" (1.74 m) and weight is 195 lb (88.451 kg). His oral temperature is 97.9 F (36.6 C). His blood pressure is 157/92 and his pulse is 100. His respiration is 20 and oxygen saturation is 95%.   He has no respiratory distress.  LABORATORY DATA:  Lab Results  Component Value Date   WBC 10.3 04/04/2012   HGB 15.2 04/04/2012   HCT 45.1 04/04/2012   MCV 91.1 04/04/2012   PLT 255 04/04/2012   No results found for this basename: NA, K, CL, CO2   No results found for this basename: ALT, AST, GGT, ALKPHOS, BILITOT     RADIOGRAPHY: Dg Chest 1 View  04/04/2012  *RADIOLOGY REPORT*  Clinical Data: Left lower lobe mass biopsy  CHEST - 1 VIEW  Comparison: 04/04/2012 CT images  Findings: Left lower lobe mass again noted.  No significant pneumothorax post biopsy.  The right hilar prominence with postobstructive atelectasis is again noted.  IMPRESSION: No pneumothorax post biopsy.   Original Report Authenticated By: Jearld Lesch, M.D.    Ct Chest Wo Contrast  03/20/2012  **ADDENDUM** CREATED: 03/20/2012 13:59:21  Prior chest radiographs dated 03/16/2012 have been digitized for comparison.  The patchy left lower lobe and right mid/lower lung opacities are grossly unchanged when compared to the current CT topogram.  These findings remain worrisome for neoplasm, less likely infection.  **END ADDENDUM** SIGNED BY: Lurlean Horns  Rito Ehrlich, M.D.   03/20/2012  *RADIOLOGY REPORT*  Clinical Data: Recent pneumonia, cough, shortness of breath  CT CHEST WITHOUT CONTRAST  Technique:  Multidetector CT imaging of the chest was performed following the standard protocol without IV contrast.  Comparison: Chest radiographs dated 08/06/2004.  Findings: Evaluation of the right perihilar region is constrained by lack of intravenous contrast.  However, there is  suspected abnormal soft tissue/nodes in this region, worrisome for neoplasm given associated findings, less likely infection.  The right middle lobe bronchus is extrinsically compressed with associated right middle lobe collapse.  Mild ground- glass opacity in the central right lower lobe is worrisome for lymphangitic spread of tumor or less likely infection (series 3/image 36).  Spiculated 2.8 x 3.4 cm mass in the left lower lobe (series 3/image 36), worrisome for synchronous primary bronchogenic neoplasm, less likely metastasis or infection.  6 mm right lower lobe nodule (series 3/image 25).  9 mm right lower lobe nodule (series 3/image 43).  Underlying mild emphysematous changes. No pleural effusion or pneumothorax.  Associated additional lymphadenopathy, including: --1.4 cm short axis right supraclavicular node (series 2/image 7) --1.4 cm short axis high right paratracheal node (series 2/image 20) --2.1 cm short axis precarinal node (series 2/image 26) --1.9 cm short axis subcarinal node (series 2/image 30)  A small left hilar node is suspected but is poorly evaluated (series 2/image 32).  Visualized thyroid is unremarkable.  The heart is normal in size.  No pericardial effusion.  Coronary atherosclerosis.  Atherosclerotic calcifications of the aortic arch.  Visualized upper abdomen is unremarkable.  Degenerative changes of the visualized thoracolumbar spine.  IMPRESSION: Evaluation is constrained by lack of intravenous contrast.  Abnormal soft tissue in the right perihilar region, highly suspicious for neoplasm/nodal metastases, less likely infection. Associated right supraclavicular and mediastinal lymphadenopathy, as described above.  Extrinsic compression of the right middle lobe bronchus with associated right middle lobe collapse.  3.4 cm spiculated mass in the left lower lobe, worrisome for synchronous primary bronchogenic neoplasm, less likely metastasis or infection.  Suspected small left hilar node.   Consider bronchoscopy for further evaluation.  Alternatively, the right supraclavicular node may be amenable to percutaneous biopsy.  These results were called by telephone on 03/20/2012 at 1158 hrs to Dr. Jeannetta Nap, who verbally acknowledged these results.  Original Report Authenticated By: Charline Bills, M.D.    Nm Pet Image Initial (pi) Skull Base To Thigh  03/28/2012  *RADIOLOGY REPORT*  Clinical Data: Initial treatment strategy for lung mass.  NUCLEAR MEDICINE PET SKULL BASE TO THIGH  Fasting Blood Glucose:  94  Technique:  16.2 mCi F-18 FDG was injected intravenously. CT data was obtained and used for attenuation correction and anatomic localization only.  (This was not acquired as a diagnostic CT examination.) Additional exam technical data entered on technologist worksheet.  Comparison:  CT chest 03/20/2012.  Findings:  Neck: Right supraclavicular lymph nodes measure up to 1.5 cm in short axis with an S U V max of 6.0.  No additional areas of abnormal metabolism in the neck.  CT images show no acute findings.  Chest:  Extensive mediastinal and hilar adenopathy is seen, all of which is hypermetabolic.  Index right paratracheal lymph node measures 1.6 cm and has an S U V max of 8.0 (CT image 82, PET image 81).  Right hilar mass is poorly measured in the absence of IV contrast. S U V max is 11.4. Associated postobstructive collapse/consolidation of the right middle lobe.  Low left paratracheal  lymph node measures 1.4 cm with an S U V max of 9.5 (CT image 88, PET image 87).  Right hilar lymph nodes are hypermetabolic but poorly measured due to lack of IV contrast.  S U V max is 6.7.  A spiculated left lower lobe mass measures 3.1 x 3.4 cm with an S U V max of 9.9.  No additional areas of abnormal hypermetabolism in the chest.  CT images show a new tiny right pleural effusion.  No pericardial effusion.  Abdomen/Pelvis:  No abnormal hypermetabolic activity within the liver, pancreas, adrenal glands, or spleen.   No hypermetabolic lymph nodes in the abdomen or pelvis. CT images show no acute findings.  A 1.7 cm low attenuation lesion is seen in the interpolar left kidney.  Atherosclerotic calcification of the arterial vasculature without abdominal aortic aneurysm.  No free fluid.  Prostate is mildly enlarged.  Skeleton:  No focal hypermetabolic activity to suggest skeletal metastasis.  IMPRESSION:  1.  Hypermetabolic right hilar and left lower lobe masses with extensive bilateral  mediastinal, hilar and right supraclavicular adenopathy, most consistent with primary bronchogenic carcinoma. 2.  Postobstructive collapse of the right middle lobe. 3.  Consultation with the [facility] [phone]) should be considered. 4.  New tiny right pleural effusion.   Original Report Authenticated By: Reyes Ivan, M.D.    Ct Biopsy  04/04/2012  *RADIOLOGY REPORT*  Clinical Data: Left lower lobe 3.4 cm mass, PET positive, concerning for malignancy  CT FLUOROSCOPY GUIDED LEFT LOWER LOBE MASS 18 GAUGE CORE BIOPSIES  Date:  04/04/2012 09:00:00  Radiologist:  M. Ruel Favors, M.D.  Medications:  2 mg Versed, 50 mcg Fentanyl  Guidance:  CT fluoroscopy  Fluoroscopy time:  15 seconds  Sedation time:  15 minutes  Contrast volume:  None.  Complications:  No immediate  PROCEDURE/FINDINGS:  Informed consent was obtained from the patient following explanation of the procedure, risks, benefits and alternatives. The patient understands, agrees and consents for the procedure. All questions were addressed.  A time out was performed.  Maximal barrier sterile technique utilized including caps, mask, sterile gowns, sterile gloves, large sterile drape, hand hygiene, and betadine  Previous imaging reviewed.  The patient was positioned prone. Initial noncontrast localization CT performed through the chest. Left lower lobe mass-like opacity was localized.  This correlates with the PET scan.  Utilizing CT fluoroscopy, a  17 gauge 6.8 cm access needle was advanced from a posterior lateral approach into the lesion.  Needle position confirmed along the peripheral edge of the mass with CT fluoroscopy.  3  18 gauge core biopsies were obtained of the lesion.  These were placed in formalin.  Needle removed.  Post procedure imaging demonstrates no evidence of hemorrhage or hematoma.  The patient tolerated the procedure well.  IMPRESSION: Successful CT fluoroscopy guided left lower lobe mass 18 gauge core biopsies.   Original Report Authenticated By: Judie Petit. Miles Costain, M.D.       IMPRESSION: Logan Russell has stage IIIB non-small cell carcinoma of the left lung.  He is eligible for thoracic radiotherapy with chemo.  PLAN:Today, I talked to the patient and family about the findings and work-up thus far.  We discussed the natural history of disease and general treatment, highlighting the role or radiotherapy in the management.  We discussed the available radiation techniques, and focused on the details of logistics and delivery.  We reviewed the anticipated acute and late sequelae associated with radiation in this setting.  The  patient was encouraged to ask questions that I answered to the best of my ability.  I filled out a patient counseling form during our discussion including treatment diagrams.  We retained a copy for our records.  The patient would like to proceed with radiation and will be scheduled for CT simulation.  I spent 60 minutes minutes face to face with the patient and more than 50% of that time was spent in counseling and/or coordination of care.   ------------------------------------------------  Artist Pais. Kathrynn Running, M.D.

## 2012-04-12 NOTE — Progress Notes (Signed)
[facility] [phone]   [phone]  CONSULT NOTE  REASON FOR CONSULTATION:  77 years old white male diagnosed with lung cancer.  HPI Logan Russell is a 77 y.o. male was no significant past medical history except for hypertension and long history of smoking. The patient mentions that 3-4 weeks ago he started having cough with shortness of breath. He was seen by his primary care physician and chest x-ray was performed which showed questionable lesion in the right lung. This was followed by CT scan of the chest on 03/20/2012 and it showed abnormal soft tissue/nodes in this region, worrisome for neoplasm given associated findings, less likely infection. The right middle lobe bronchus is extrinsically compressed with associated right middle lobe collapse. There was also Spiculated 2.8 x 3.4 cm mass in the left lower lobe worrisome for synchronous primary bronchogenic neoplasm, less likely metastasis or infection. Associated additional lymphadenopathy, including, 1.4 cm short axis right supraclavicular node, 1.4 cm short axis high right paratracheal node, 2.1 cm short axis precarinal node, 1.9 cm short axis subcarinal node. A PET scan was performed on 03/28/2012 and it showed hypermetabolic right hilar and left lower lobe masses with extensive bilateral mediastinal, hilar and right supraclavicular adenopathy most consistent with primary bronchogenic carcinoma. There was also postobstructive collapse of the right middle lobe the and a new tiny right pleural effusion. The patient was referred to Dr. Tyrone Sage who ordered CT guided left lower lobe fine needle aspiration and core biopsies by interventional radiology. This was performed on 04/04/2012 and the final pathology was consistent with invasive adenocarcinoma. The tissue blocks will be sent for EGFR mutation as well as ALK gene translocation. Dr. Tyrone Sage kindly referred the patient to me today for evaluation and  recommendation regarding treatment of his condition. The patient was seen at the multidisciplinary thoracic oncology clinic [facility]).  The patient is feeling fine today with no specific complaints except for mild cough and anxiety about his recent diagnosis. He denied having any significant chest pain, shortness breath or hemoptysis. He lost around 10 pounds in the last few months. He denied having any significant visual changes but has occasional headache. His family history significant for a father with hypertension and heart disease, mother with brain aneurysm and brother with COPD. The patient is married and has 4 children. He was accompanied by his wife [name]. He is currently retired and used to Warden/ranger. He has a history of smoking one pack per day for around 63 years and he quit 5 months ago, he drinks alcohol occasionally and no history of drug abuse.   @SFHPI @  Past Medical History  Diagnosis Date  . Hypertension   . Lung cancer 04/12/2012    No past surgical history on file.  Family History  Problem Relation Age of Onset  . Heart disease Father     Social History History  Substance Use Topics  . Smoking status: Former Smoker    Quit date: 12/28/2011  . Smokeless tobacco: Not on file  . Alcohol Use: No    No Known Allergies  Current Outpatient Prescriptions  Medication Sig Dispense Refill  . triamterene-hydrochlorothiazide (MAXZIDE) 75-50 MG per tablet Take 1 tablet by mouth daily.      . promethazine-codeine (PHENERGAN WITH CODEINE) 6.25-10 MG/5ML syrup Take 5 mLs by mouth every 4 (four) hours as needed.        Review of Systems  A comprehensive review of systems was negative except for: Respiratory:  positive for cough Behavioral/Psych: positive for anxiety  Physical Exam  RAL: 77 years old well developed white male. SKIN: skin color, texture, turgor are normal HEAD: Normocephalic, No masses, lesions, tenderness or abnormalities EYES: normal EARS:  External ears normal OROPHARYNX:no exudate and no erythema  NECK: supple, no adenopathy LYMPH:  no palpable lymphadenopathy, no hepatosplenomegaly LUNGS: clear to auscultation  HEART: regular rate & rhythm, no murmurs and no gallops ABDOMEN:abdomen soft, non-tender, normal bowel sounds and no masses or organomegaly BACK: Back symmetric, no curvature. EXTREMITIES:no joint deformities, effusion, or inflammation, no edema, no skin discoloration, no clubbing  NEURO: alert & oriented x 3 with fluent speech, no focal motor/sensory deficits  PERFORMANCE STATUS: ECOG 1  LABORATORY DATA: Lab Results  Component Value Date   WBC 10.3 04/04/2012   HGB 15.2 04/04/2012   HCT 45.1 04/04/2012   MCV 91.1 04/04/2012   PLT 255 04/04/2012      Chemistry   No results found for this basename: NA, K, CL, CO2, BUN, CREATININE, GLU   No results found for this basename: CALCIUM, ALKPHOS, AST, ALT, BILITOT       RADIOGRAPHIC STUDIES: Dg Chest 1 View  04/04/2012  *RADIOLOGY REPORT*  Clinical Data: Left lower lobe mass biopsy  CHEST - 1 VIEW  Comparison: 04/04/2012 CT images  Findings: Left lower lobe mass again noted.  No significant pneumothorax post biopsy.  The right hilar prominence with postobstructive atelectasis is again noted.  IMPRESSION: No pneumothorax post biopsy.   Original Report Authenticated By: Jearld Lesch, M.D.    Ct Chest Wo Contrast  03/20/2012  **ADDENDUM** CREATED: 03/20/2012 13:59:21  Prior chest radiographs dated 03/16/2012 have been digitized for comparison.  The patchy left lower lobe and right mid/lower lung opacities are grossly unchanged when compared to the current CT topogram.  These findings remain worrisome for neoplasm, less likely infection.  **END ADDENDUM** SIGNED BY: Charline Bills, M.D.   03/20/2012  *RADIOLOGY REPORT*  Clinical Data: Recent pneumonia, cough, shortness of breath  CT CHEST WITHOUT CONTRAST  Technique:  Multidetector CT imaging of the chest was  performed following the standard protocol without IV contrast.  Comparison: Chest radiographs dated 08/06/2004.  Findings: Evaluation of the right perihilar region is constrained by lack of intravenous contrast.  However, there is suspected abnormal soft tissue/nodes in this region, worrisome for neoplasm given associated findings, less likely infection.  The right middle lobe bronchus is extrinsically compressed with associated right middle lobe collapse.  Mild ground- glass opacity in the central right lower lobe is worrisome for lymphangitic spread of tumor or less likely infection (series 3/image 36).  Spiculated 2.8 x 3.4 cm mass in the left lower lobe (series 3/image 36), worrisome for synchronous primary bronchogenic neoplasm, less likely metastasis or infection.  6 mm right lower lobe nodule (series 3/image 25).  9 mm right lower lobe nodule (series 3/image 43).  Underlying mild emphysematous changes. No pleural effusion or pneumothorax.  Associated additional lymphadenopathy, including: --1.4 cm short axis right supraclavicular node (series 2/image 7) --1.4 cm short axis high right paratracheal node (series 2/image 20) --2.1 cm short axis precarinal node (series 2/image 26) --1.9 cm short axis subcarinal node (series 2/image 30)  A small left hilar node is suspected but is poorly evaluated (series 2/image 32).  Visualized thyroid is unremarkable.  The heart is normal in size.  No pericardial effusion.  Coronary atherosclerosis.  Atherosclerotic calcifications of the aortic arch.  Visualized upper abdomen is unremarkable.  Degenerative changes  of the visualized thoracolumbar spine.  IMPRESSION: Evaluation is constrained by lack of intravenous contrast.  Abnormal soft tissue in the right perihilar region, highly suspicious for neoplasm/nodal metastases, less likely infection. Associated right supraclavicular and mediastinal lymphadenopathy, as described above.  Extrinsic compression of the right middle lobe  bronchus with associated right middle lobe collapse.  3.4 cm spiculated mass in the left lower lobe, worrisome for synchronous primary bronchogenic neoplasm, less likely metastasis or infection.  Suspected small left hilar node.  Consider bronchoscopy for further evaluation.  Alternatively, the right supraclavicular node may be amenable to percutaneous biopsy.  These results were called by telephone on 03/20/2012 at 1158 hrs to Dr. Jeannetta Nap, who verbally acknowledged these results.  Original Report Authenticated By: Charline Bills, M.D.    Nm Pet Image Initial (pi) Skull Base To Thigh  03/28/2012  *RADIOLOGY REPORT*  Clinical Data: Initial treatment strategy for lung mass.  NUCLEAR MEDICINE PET SKULL BASE TO THIGH  Fasting Blood Glucose:  94  Technique:  16.2 mCi F-18 FDG was injected intravenously. CT data was obtained and used for attenuation correction and anatomic localization only.  (This was not acquired as a diagnostic CT examination.) Additional exam technical data entered on technologist worksheet.  Comparison:  CT chest 03/20/2012.  Findings:  Neck: Right supraclavicular lymph nodes measure up to 1.5 cm in short axis with an S U V max of 6.0.  No additional areas of abnormal metabolism in the neck.  CT images show no acute findings.  Chest:  Extensive mediastinal and hilar adenopathy is seen, all of which is hypermetabolic.  Index right paratracheal lymph node measures 1.6 cm and has an S U V max of 8.0 (CT image 82, PET image 81).  Right hilar mass is poorly measured in the absence of IV contrast. S U V max is 11.4. Associated postobstructive collapse/consolidation of the right middle lobe.  Low left paratracheal lymph node measures 1.4 cm with an S U V max of 9.5 (CT image 88, PET image 87).  Right hilar lymph nodes are hypermetabolic but poorly measured due to lack of IV contrast.  S U V max is 6.7.  A spiculated left lower lobe mass measures 3.1 x 3.4 cm with an S U V max of 9.9.  No additional  areas of abnormal hypermetabolism in the chest.  CT images show a new tiny right pleural effusion.  No pericardial effusion.  Abdomen/Pelvis:  No abnormal hypermetabolic activity within the liver, pancreas, adrenal glands, or spleen.  No hypermetabolic lymph nodes in the abdomen or pelvis. CT images show no acute findings.  A 1.7 cm low attenuation lesion is seen in the interpolar left kidney.  Atherosclerotic calcification of the arterial vasculature without abdominal aortic aneurysm.  No free fluid.  Prostate is mildly enlarged.  Skeleton:  No focal hypermetabolic activity to suggest skeletal metastasis.  IMPRESSION:  1.  Hypermetabolic right hilar and left lower lobe masses with extensive bilateral  mediastinal, hilar and right supraclavicular adenopathy, most consistent with primary bronchogenic carcinoma. 2.  Postobstructive collapse of the right middle lobe. 3.  Consultation with the [facility] [phone]) should be considered. 4.  New tiny right pleural effusion.   Original Report Authenticated By: Reyes Ivan, M.D.    Ct Biopsy  04/04/2012  *RADIOLOGY REPORT*  Clinical Data: Left lower lobe 3.4 cm mass, PET positive, concerning for malignancy  CT FLUOROSCOPY GUIDED LEFT LOWER LOBE MASS 18 GAUGE CORE BIOPSIES  Date:  04/04/2012  09:00:00  Radiologist:  Judie Petit. Ruel Favors, M.D.  Medications:  2 mg Versed, 50 mcg Fentanyl  Guidance:  CT fluoroscopy  Fluoroscopy time:  15 seconds  Sedation time:  15 minutes  Contrast volume:  None.  Complications:  No immediate  PROCEDURE/FINDINGS:  Informed consent was obtained from the patient following explanation of the procedure, risks, benefits and alternatives. The patient understands, agrees and consents for the procedure. All questions were addressed.  A time out was performed.  Maximal barrier sterile technique utilized including caps, mask, sterile gowns, sterile gloves, large sterile drape, hand hygiene, and betadine   Previous imaging reviewed.  The patient was positioned prone. Initial noncontrast localization CT performed through the chest. Left lower lobe mass-like opacity was localized.  This correlates with the PET scan.  Utilizing CT fluoroscopy, a 17 gauge 6.8 cm access needle was advanced from a posterior lateral approach into the lesion.  Needle position confirmed along the peripheral edge of the mass with CT fluoroscopy.  3  18 gauge core biopsies were obtained of the lesion.  These were placed in formalin.  Needle removed.  Post procedure imaging demonstrates no evidence of hemorrhage or hematoma.  The patient tolerated the procedure well.  IMPRESSION: Successful CT fluoroscopy guided left lower lobe mass 18 gauge core biopsies.   Original Report Authenticated By: Judie Petit. Shick, M.D.     ASSESSMENT: This is a very pleasant 77 years old white male recently diagnosed with stage IIIB (T2 A., N3, MX) non-small cell lung cancer consistent with invasive adenocarcinoma.  PLAN: I have a lengthy discussion with the patient and his wife today about his disease stage, prognosis and treatment options. I recommended for the patient the following: #1 complete the staging workup I ordered an MRI of the brain to rule out any brain metastasis. #2 I recommended for the patient treatment with a course of concurrent chemoradiation with weekly carboplatin for AUC of 2 and paclitaxel 45 mg/M2. I discussed with the patient adverse effect of the chemotherapy including but not limited to alopecia, myelosuppression, nausea and vomiting, peripheral neuropathy, liver or renal dysfunction. #3 the patient will be seen later today by Dr. Kathrynn Running for evaluation and discussion of the radiotherapy option. #4 The patient agreed to proceed with treatment as planned. He is expected to start the first cycle of his treatment the week after Thanksgiving on 04/30/2012. #5 the patient will have chemotherapy education class before starting the first cycle  of his chemotherapy. #6 I will call his pharmacy with prescription for Compazine 10 mg by mouth every 6 hours as needed for nausea. #7 The patient would come back for followup visit in 3 weeks with the start of the first cycle of his systemic chemotherapy. I gave the patient and his wife the time to ask questions and I answered them completely to their satisfaction. The patient was advised to call me immediately if he has any concerning symptoms and interval.  All questions were answered. The patient knows to call the clinic with any problems, questions or concerns. We can certainly see the patient much sooner if necessary.  Thank you so much for allowing me to participate in the care of Nancy Marus. I will continue to follow up the patient with you and assist in his care.  I spent 30 minutes counseling the patient face to face. The total time spent in the appointment was 60 minutes.  Jay Kempe K. 04/12/2012, 2:53 PM

## 2012-04-13 ENCOUNTER — Encounter: Payer: Self-pay | Admitting: *Deleted

## 2012-04-13 ENCOUNTER — Telehealth: Payer: Self-pay | Admitting: Internal Medicine

## 2012-04-13 NOTE — Telephone Encounter (Signed)
S/w pt and pt's wife, gave appt 11/20 @ 10am for chemo ed class. Advised pt to pick up appt calendar at that time.

## 2012-04-13 NOTE — Progress Notes (Signed)
Spoke with pt and wife at [facility] yesterday.  Educational/resource information given and explained to pt.  Nutrition and distress screening completed. Both Midwife spoke to pt and addressed concerns.

## 2012-04-13 NOTE — Telephone Encounter (Signed)
Adjusted 12/2 appts to add tx. Pt already aware to get new schedule when he comes back for chemo ed class 11/20.

## 2012-04-14 NOTE — Patient Instructions (Signed)
You had recent diagnosis of stage IIIB non-small cell lung cancer. I will complete the staging workup I ordered an MRI of the brain. We discussed treatment options including concurrent chemotherapy and radiation, first cycle expected on 04/30/2012

## 2012-04-18 ENCOUNTER — Other Ambulatory Visit: Payer: Medicare Other

## 2012-04-18 ENCOUNTER — Encounter: Payer: Self-pay | Admitting: *Deleted

## 2012-04-19 ENCOUNTER — Ambulatory Visit
Admission: RE | Admit: 2012-04-19 | Discharge: 2012-04-19 | Disposition: A | Discharge status: Home / Self Care | Payer: Medicare Other | Source: Ambulatory Visit | Attending: Cardiothoracic Surgery | Admitting: Cardiothoracic Surgery

## 2012-04-19 ENCOUNTER — Other Ambulatory Visit (HOSPITAL_COMMUNITY)
Admission: RE | Admit: 2012-04-19 | Discharge: 2012-04-19 | Disposition: A | Discharge status: Home / Self Care | Payer: Medicare Other | Source: Ambulatory Visit | Attending: Internal Medicine | Admitting: Internal Medicine

## 2012-04-19 DIAGNOSIS — C3490 Malignant neoplasm of unspecified part of unspecified bronchus or lung: Secondary | ICD-10-CM | POA: Insufficient documentation

## 2012-04-19 DIAGNOSIS — C7931 Secondary malignant neoplasm of brain: Secondary | ICD-10-CM

## 2012-04-19 MED ORDER — GADOBENATE DIMEGLUMINE 529 MG/ML IV SOLN
9.0000 mL | Freq: Once | INTRAVENOUS | Status: AC | PRN
  Administered 2012-04-19: 9 mL via INTRAVENOUS

## 2012-04-20 ENCOUNTER — Ambulatory Visit
Admission: RE | Admit: 2012-04-20 | Discharge: 2012-04-20 | Disposition: A | Discharge status: Home / Self Care | Payer: Medicare Other | Source: Ambulatory Visit | Attending: Radiation Oncology | Admitting: Radiation Oncology

## 2012-04-20 ENCOUNTER — Encounter: Payer: Self-pay | Admitting: Radiation Oncology

## 2012-04-20 ENCOUNTER — Telehealth: Payer: Self-pay | Admitting: Radiation Oncology

## 2012-04-20 DIAGNOSIS — Z79899 Other long term (current) drug therapy: Secondary | ICD-10-CM | POA: Insufficient documentation

## 2012-04-20 DIAGNOSIS — C3490 Malignant neoplasm of unspecified part of unspecified bronchus or lung: Secondary | ICD-10-CM | POA: Insufficient documentation

## 2012-04-20 DIAGNOSIS — Z510 Encounter for antineoplastic radiation therapy: Secondary | ICD-10-CM | POA: Insufficient documentation

## 2012-04-20 DIAGNOSIS — I10 Essential (primary) hypertension: Secondary | ICD-10-CM | POA: Insufficient documentation

## 2012-04-20 NOTE — Telephone Encounter (Signed)
Met with patient to discuss RO billing.  Dx: 162.9 Lung, NOS  Attending Rad: MM  Rad Tx: Daily

## 2012-04-20 NOTE — Progress Notes (Signed)
  Radiation Oncology         [phone]) [phone] ________________________________  Name: Logan Russell MRN: [id number]  Date: 04/20/2012  DOB: August 10, 1934  SIMULATION AND TREATMENT PLANNING NOTE  DIAGNOSIS:  77 yo man with stage IIIB NSCLC of the left lung  NARRATIVE:  The patient was brought to the CT Simulation planning suite.  Identity was confirmed.  All relevant records and images related to the planned course of therapy were reviewed.  The patient freely provided informed written consent to proceed with treatment after reviewing the details related to the planned course of therapy. The consent form was witnessed and verified by the simulation staff.  Then, the patient was set-up in a stable reproducible  supine position for radiation therapy.  CT images were obtained.  Surface markings were placed.  The CT images were loaded into the planning software.  Then the target and avoidance structures were contoured.  Treatment planning then occurred.  The radiation prescription was entered and confirmed.  A total of 6 complex treatment devices were fabricated including 5 multileaf collimator apertures to shape radiation conformally around the target. I have requested : 3D Simulation  I have requested a DVH of the following structures: Cord, Heart, Lungs, Esophagus, Targets.  I have ordered:Nutrition Consult and CBC  PLAN:  The patient will receive 66 Gy in 33 fraction.  ________________________________  Artist Pais Kathrynn Running, M.D.

## 2012-04-30 ENCOUNTER — Other Ambulatory Visit (HOSPITAL_BASED_OUTPATIENT_CLINIC_OR_DEPARTMENT_OTHER): Payer: Medicare Other | Admitting: Lab

## 2012-04-30 ENCOUNTER — Telehealth: Payer: Self-pay | Admitting: *Deleted

## 2012-04-30 ENCOUNTER — Ambulatory Visit: Payer: Medicare Other

## 2012-04-30 ENCOUNTER — Telehealth: Payer: Self-pay | Admitting: Internal Medicine

## 2012-04-30 ENCOUNTER — Other Ambulatory Visit: Payer: Self-pay | Admitting: Internal Medicine

## 2012-04-30 ENCOUNTER — Ambulatory Visit: Payer: Medicare Other | Admitting: Radiation Oncology

## 2012-04-30 ENCOUNTER — Ambulatory Visit (HOSPITAL_BASED_OUTPATIENT_CLINIC_OR_DEPARTMENT_OTHER): Payer: Medicare Other | Admitting: Internal Medicine

## 2012-04-30 ENCOUNTER — Ambulatory Visit (HOSPITAL_BASED_OUTPATIENT_CLINIC_OR_DEPARTMENT_OTHER): Payer: Medicare Other

## 2012-04-30 VITALS — BP 177/98 | HR 90 | Temp 97.8°F | Resp 20 | Ht 68.5 in | Wt 192.8 lb

## 2012-04-30 VITALS — BP 161/86 | HR 79 | Temp 98.2°F | Resp 18

## 2012-04-30 DIAGNOSIS — C3410 Malignant neoplasm of upper lobe, unspecified bronchus or lung: Secondary | ICD-10-CM

## 2012-04-30 DIAGNOSIS — Z5111 Encounter for antineoplastic chemotherapy: Secondary | ICD-10-CM

## 2012-04-30 DIAGNOSIS — C3490 Malignant neoplasm of unspecified part of unspecified bronchus or lung: Secondary | ICD-10-CM

## 2012-04-30 DIAGNOSIS — C3430 Malignant neoplasm of lower lobe, unspecified bronchus or lung: Secondary | ICD-10-CM

## 2012-04-30 DIAGNOSIS — R0602 Shortness of breath: Secondary | ICD-10-CM

## 2012-04-30 LAB — COMPREHENSIVE METABOLIC PANEL (CC13)
ALT: 20 U/L (ref 0–55)
BUN: 26 mg/dL (ref 7.0–26.0)
CO2: 29 mEq/L (ref 22–29)
Calcium: 9.7 mg/dL (ref 8.4–10.4)
Chloride: 101 mEq/L (ref 98–107)
Creatinine: 1.7 mg/dL — ABNORMAL HIGH (ref 0.7–1.3)
Total Bilirubin: 0.51 mg/dL (ref 0.20–1.20)

## 2012-04-30 LAB — CBC WITH DIFFERENTIAL/PLATELET
BASO%: 0.5 % (ref 0.0–2.0)
Basophils Absolute: 0.1 10*3/uL (ref 0.0–0.1)
EOS%: 2.3 % (ref 0.0–7.0)
HCT: 44.9 % (ref 38.4–49.9)
HGB: 15 g/dL (ref 13.0–17.1)
LYMPH%: 10.8 % — ABNORMAL LOW (ref 14.0–49.0)
MCH: 31.1 pg (ref 27.2–33.4)
MCHC: 33.4 g/dL (ref 32.0–36.0)
MONO#: 0.5 10*3/uL (ref 0.1–0.9)
NEUT%: 81.4 % — ABNORMAL HIGH (ref 39.0–75.0)
Platelets: 204 10*3/uL (ref 140–400)
lymph#: 1.2 10*3/uL (ref 0.9–3.3)

## 2012-04-30 MED ORDER — PACLITAXEL CHEMO INJECTION 300 MG/50ML
45.0000 mg/m2 | Freq: Once | INTRAVENOUS | Status: AC
  Administered 2012-04-30: 96 mg via INTRAVENOUS
  Filled 2012-04-30: qty 16

## 2012-04-30 MED ORDER — PROCHLORPERAZINE MALEATE 10 MG PO TABS: 10.0000 mg | ORAL_TABLET | Freq: Four times a day (QID) | ORAL | Status: DC | PRN

## 2012-04-30 MED ORDER — FAMOTIDINE IN NACL 20-0.9 MG/50ML-% IV SOLN
20.0000 mg | Freq: Once | INTRAVENOUS | Status: AC
  Administered 2012-04-30: 20 mg via INTRAVENOUS

## 2012-04-30 MED ORDER — DEXAMETHASONE SODIUM PHOSPHATE 4 MG/ML IJ SOLN
20.0000 mg | Freq: Once | INTRAMUSCULAR | Status: AC
  Administered 2012-04-30: 20 mg via INTRAVENOUS

## 2012-04-30 MED ORDER — ONDANSETRON 16 MG/50ML IVPB (CHCC)
16.0000 mg | Freq: Once | INTRAVENOUS | Status: AC
  Administered 2012-04-30: 16 mg via INTRAVENOUS

## 2012-04-30 MED ORDER — DIPHENHYDRAMINE HCL 50 MG/ML IJ SOLN
50.0000 mg | Freq: Once | INTRAMUSCULAR | Status: AC
  Administered 2012-04-30: 50 mg via INTRAVENOUS

## 2012-04-30 MED ORDER — SODIUM CHLORIDE 0.9 % IV SOLN
144.0000 mg | Freq: Once | INTRAVENOUS | Status: AC
  Administered 2012-04-30: 140 mg via INTRAVENOUS
  Filled 2012-04-30: qty 14

## 2012-04-30 MED ORDER — SODIUM CHLORIDE 0.9 % IV SOLN
Freq: Once | INTRAVENOUS | Status: AC
  Administered 2012-04-30: 12:00:00 via INTRAVENOUS

## 2012-04-30 NOTE — Telephone Encounter (Signed)
appts made and printed for pt aom °

## 2012-04-30 NOTE — Patient Instructions (Signed)
MRI of the brain showed no evidence for metastatic disease. We'll proceed with concurrent chemoradiation today as scheduled. Followup visit in 2 weeks

## 2012-04-30 NOTE — Patient Instructions (Addendum)
[facility] Discharge Instructions for Patients Receiving Chemotherapy  Today you received the following chemotherapy agents Taxol and Carboplatin.  To help prevent nausea and vomiting after your treatment, we encourage you to take your nausea medication.  Begin taking your nausea medication as often as prescribed for by Dr. Arbutus Ped.    If you develop nausea and vomiting that is not controlled by your nausea medication, call the clinic. If it is after clinic hours your family physician or the after hours number for the clinic or go to the Emergency Department.   BELOW ARE SYMPTOMS THAT SHOULD BE REPORTED IMMEDIATELY:  *FEVER GREATER THAN 100.5 F  *CHILLS WITH OR WITHOUT FEVER  NAUSEA AND VOMITING THAT IS NOT CONTROLLED WITH YOUR NAUSEA MEDICATION  *UNUSUAL SHORTNESS OF BREATH  *UNUSUAL BRUISING OR BLEEDING  TENDERNESS IN MOUTH AND THROAT WITH OR WITHOUT PRESENCE OF ULCERS  *URINARY PROBLEMS  *BOWEL PROBLEMS  UNUSUAL RASH Items with * indicate a potential emergency and should be followed up as soon as possible.  One of the nurses will contact you 24 hours after your treatment. Please let the nurse know about any problems that you may have experienced. Feel free to call the clinic you have any questions or concerns. The clinic phone number is [phone].   I have been informed and understand all the instructions given to me. I know to contact the clinic, my physician, or go to the Emergency Department if any problems should occur. I do not have any questions at this time, but understand that I may call the clinic during office hours   should I have any questions or need assistance in obtaining follow up care.    __________________________________________  _____________  __________ Signature of Patient or Authorized Representative            Date                   Time    __________________________________________ Nurse's Signature       CARBOPLATIN  (KAR boe pla tin) is a chemotherapy drug. It targets fast dividing cells, like cancer cells, and causes these cells to die. This medicine is used to treat ovarian cancer and many other cancers. This medicine may be used for other purposes; ask your health care provider or pharmacist if you have questions. What should I tell my health care provider before I take this medicine? They need to know if you have any of these conditions: -blood disorders -hearing problems -kidney disease -recent or ongoing radiation therapy -an unusual or allergic reaction to carboplatin, cisplatin, other chemotherapy, other medicines, foods, dyes, or preservatives -pregnant or trying to get pregnant -breast-feeding How should I use this medicine? This drug is usually given as an infusion into a vein. It is administered in a hospital or clinic by a specially trained health care professional. Talk to your pediatrician regarding the use of this medicine in children. Special care may be needed. Overdosage: If you think you have taken too much of this medicine contact a poison control center or emergency room at once. NOTE: This medicine is only for you. Do not share this medicine with others. What if I miss a dose? It is important not to miss a dose. Call your doctor or health care professional if you are unable to keep an appointment. What may interact with this medicine? -medicines for seizures -medicines to increase blood counts like filgrastim, pegfilgrastim, sargramostim -some antibiotics like amikacin, gentamicin, neomycin, streptomycin, tobramycin -  vaccines Talk to your doctor or health care professional before taking any of these medicines: -acetaminophen -aspirin -ibuprofen -ketoprofen -naproxen This list may not describe all possible interactions. Give your health care provider a list of all the medicines, herbs, non-prescription drugs, or dietary supplements you use. Also tell them if you smoke, drink  alcohol, or use illegal drugs. Some items may interact with your medicine. What should I watch for while using this medicine? Your condition will be monitored carefully while you are receiving this medicine. You will need important blood work done while you are taking this medicine. This drug may make you feel generally unwell. This is not uncommon, as chemotherapy can affect healthy cells as well as cancer cells. Report any side effects. Continue your course of treatment even though you feel ill unless your doctor tells you to stop. In some cases, you may be given additional medicines to help with side effects. Follow all directions for their use. Call your doctor or health care professional for advice if you get a fever, chills or sore throat, or other symptoms of a cold or flu. Do not treat yourself. This drug decreases your body's ability to fight infections. Try to avoid being around people who are sick. This medicine may increase your risk to bruise or bleed. Call your doctor or health care professional if you notice any unusual bleeding. Be careful brushing and flossing your teeth or using a toothpick because you may get an infection or bleed more easily. If you have any dental work done, tell your dentist you are receiving this medicine. Avoid taking products that contain aspirin, acetaminophen, ibuprofen, naproxen, or ketoprofen unless instructed by your doctor. These medicines may hide a fever. Do not become pregnant while taking this medicine. Women should inform their doctor if they wish to become pregnant or think they might be pregnant. There is a potential for serious side effects to an unborn child. Talk to your health care professional or pharmacist for more information. Do not breast-feed an infant while taking this medicine. What side effects may I notice from receiving this medicine? Side effects that you should report to your doctor or health care professional as soon as  possible: -allergic reactions like skin rash, itching or hives, swelling of the face, lips, or tongue -signs of infection - fever or chills, cough, sore throat, pain or difficulty passing urine -signs of decreased platelets or bleeding - bruising, pinpoint red spots on the skin, black, tarry stools, nosebleeds -signs of decreased red blood cells - unusually weak or tired, fainting spells, lightheadedness -breathing problems -changes in hearing -changes in vision -chest pain -high blood pressure -low blood counts - This drug may decrease the number of white blood cells, red blood cells and platelets. You may be at increased risk for infections and bleeding. -nausea and vomiting -pain, swelling, redness or irritation at the injection site -pain, tingling, numbness in the hands or feet -problems with balance, talking, walking -trouble passing urine or change in the amount of urine Side effects that usually do not require medical attention (report to your doctor or health care professional if they continue or are bothersome): -hair loss -loss of appetite -metallic taste in the mouth or changes in taste This list may not describe all possible side effects. Call your doctor for medical advice about side effects. You may report side effects to FDA at 1-800-FDA-1088. Where should I keep my medicine? This drug is given in a hospital or clinic and will not  be stored at home. NOTE: This sheet is a summary. It may not cover all possible information. If you have questions about this medicine, talk to your doctor, pharmacist, or health care provider.  2012, Elsevier/Gold Standard. (08/21/2007 2:38:05 PM)    PACLITAXEL (PAK li TAX el) is a chemotherapy drug. It targets fast dividing cells, like cancer cells, and causes these cells to die. This medicine is used to treat ovarian cancer, breast cancer, and other cancers. This medicine may be used for other purposes; ask your health care provider or  pharmacist if you have questions. What should I tell my health care provider before I take this medicine? They need to know if you have any of these conditions: -blood disorders -irregular heartbeat -infection (especially a virus infection such as chickenpox, cold sores, or herpes) -liver disease -previous or ongoing radiation therapy -an unusual or allergic reaction to paclitaxel, alcohol, polyoxyethylated castor oil, other chemotherapy agents, other medicines, foods, dyes, or preservatives -pregnant or trying to get pregnant -breast-feeding How should I use this medicine? This drug is given as an infusion into a vein. It is administered in a hospital or clinic by a specially trained health care professional. Talk to your pediatrician regarding the use of this medicine in children. Special care may be needed. Overdosage: If you think you have taken too much of this medicine contact a poison control center or emergency room at once. NOTE: This medicine is only for you. Do not share this medicine with others. What if I miss a dose? It is important not to miss your dose. Call your doctor or health care professional if you are unable to keep an appointment. What may interact with this medicine? Do not take this medicine with any of the following medications: -disulfiram -metronidazole This medicine may also interact with the following medications: -cyclosporine -dexamethasone -diazepam -ketoconazole -medicines to increase blood counts like filgrastim, pegfilgrastim, sargramostim -other chemotherapy drugs like cisplatin, doxorubicin, epirubicin, etoposide, teniposide, vincristine -quinidine -testosterone -vaccines -verapamil Talk to your doctor or health care professional before taking any of these medicines: -acetaminophen -aspirin -ibuprofen -ketoprofen -naproxen This list may not describe all possible interactions. Give your health care provider a list of all the medicines,  herbs, non-prescription drugs, or dietary supplements you use. Also tell them if you smoke, drink alcohol, or use illegal drugs. Some items may interact with your medicine. What should I watch for while using this medicine? Your condition will be monitored carefully while you are receiving this medicine. You will need important blood work done while you are taking this medicine. This drug may make you feel generally unwell. This is not uncommon, as chemotherapy can affect healthy cells as well as cancer cells. Report any side effects. Continue your course of treatment even though you feel ill unless your doctor tells you to stop. In some cases, you may be given additional medicines to help with side effects. Follow all directions for their use. Call your doctor or health care professional for advice if you get a fever, chills or sore throat, or other symptoms of a cold or flu. Do not treat yourself. This drug decreases your body's ability to fight infections. Try to avoid being around people who are sick. This medicine may increase your risk to bruise or bleed. Call your doctor or health care professional if you notice any unusual bleeding. Be careful brushing and flossing your teeth or using a toothpick because you may get an infection or bleed more easily. If you have  any dental work done, tell your dentist you are receiving this medicine. Avoid taking products that contain aspirin, acetaminophen, ibuprofen, naproxen, or ketoprofen unless instructed by your doctor. These medicines may hide a fever. Do not become pregnant while taking this medicine. Women should inform their doctor if they wish to become pregnant or think they might be pregnant. There is a potential for serious side effects to an unborn child. Talk to your health care professional or pharmacist for more information. Do not breast-feed an infant while taking this medicine. Men are advised not to father a child while receiving this  medicine. What side effects may I notice from receiving this medicine? Side effects that you should report to your doctor or health care professional as soon as possible: -allergic reactions like skin rash, itching or hives, swelling of the face, lips, or tongue -low blood counts - This drug may decrease the number of white blood cells, red blood cells and platelets. You may be at increased risk for infections and bleeding. -signs of infection - fever or chills, cough, sore throat, pain or difficulty passing urine -signs of decreased platelets or bleeding - bruising, pinpoint red spots on the skin, black, tarry stools, nosebleeds -signs of decreased red blood cells - unusually weak or tired, fainting spells, lightheadedness -breathing problems -chest pain -high or low blood pressure -mouth sores -nausea and vomiting -pain, swelling, redness or irritation at the injection site -pain, tingling, numbness in the hands or feet -slow or irregular heartbeat -swelling of the ankle, feet, hands Side effects that usually do not require medical attention (report to your doctor or health care professional if they continue or are bothersome): -bone pain -complete hair loss including hair on your head, underarms, pubic hair, eyebrows, and eyelashes -changes in the color of fingernails -diarrhea -loosening of the fingernails -loss of appetite -muscle or joint pain -red flush to skin -sweating This list may not describe all possible side effects. Call your doctor for medical advice about side effects. You may report side effects to FDA at 1-800-FDA-1088. Where should I keep my medicine? This drug is given in a hospital or clinic and will not be stored at home. NOTE: This sheet is a summary. It may not cover all possible information. If you have questions about this medicine, talk to your doctor, pharmacist, or health care provider.  2012, Elsevier/Gold Standard. (04/28/2008 11:54:26 AM)

## 2012-04-30 NOTE — Progress Notes (Signed)
1318 VSS. Taxol iniated at 66.67ml/hr x 16ml VTBI  1345 VSS @ 5, 10, and 15 minutes assessments.  Patient with no complaints.Rate increased to 154ml/hr x 33ml VTBI  1400 VSS. Patient with no complaints.  Rate increased to 147ml/hr x 50ml VTBI  1415 VSS. Patient with no complaints. Rate increased to goal rate of  257ml/hr x 66.82ml VTBI  1430 VSS. Patient with no complaints. Patient continues at goal rate of 23ml/hr.

## 2012-04-30 NOTE — Progress Notes (Signed)
[facility] [phone]   [phone]  OFFICE PROGRESS NOTE  [name], MD [address] [place] [address]  DIAGNOSIS: Stage IIIB (T2a., N3, M0) non-small cell lung cancer consistent with invasive adenocarcinoma with negative ALK gene translocation and EGFR mutation diagnosed in October of 2013  PRIOR THERAPY: None.  CURRENT THERAPY: Concurrent chemoradiation with weekly carboplatin for AUC of 2 and paclitaxel 45 mg/M2, first dose today.  INTERVAL HISTORY: Logan Russell 77 y.o. male returns to the clinic today for followup visit accompanied his wife. The patient is feeling fine today with no specific complaints except for mild cough and wheezes. He denied having any significant chest pain but continues to have shortness breath with exertion. The patient denied having any significant weight loss or night sweats. He had MRI of the brain performed recently that showed no evidence for intracranial metastatic disease. He is here today to start the first cycle of concurrent chemoradiation with weekly carboplatin and paclitaxel.  MEDICAL HISTORY: Past Medical History  Diagnosis Date  . Hypertension   . Lung cancer 04/12/2012    ALLERGIES:   has no known allergies.  MEDICATIONS:  Current Outpatient Prescriptions  Medication Sig Dispense Refill  . prochlorperazine (COMPAZINE) 10 MG tablet Take 1 tablet (10 mg total) by mouth every 6 (six) hours as needed.  60 tablet  0  . promethazine-codeine (PHENERGAN WITH CODEINE) 6.25-10 MG/5ML syrup Take 5 mLs by mouth every 4 (four) hours as needed.      . triamterene-hydrochlorothiazide (MAXZIDE) 75-50 MG per tablet Take 1 tablet by mouth daily.        REVIEW OF SYSTEMS:  A comprehensive review of systems was negative except for: Respiratory: positive for cough, dyspnea on exertion and wheezing   PHYSICAL EXAMINATION: General appearance: alert, cooperative and no distress Head:  Normocephalic, without obvious abnormality, atraumatic Neck: no adenopathy Resp: clear to auscultation bilaterally Cardio: regular rate and rhythm, S1, S2 normal, no murmur, click, rub or gallop GI: soft, non-tender; bowel sounds normal; no masses,  no organomegaly Extremities: extremities normal, atraumatic, no cyanosis or edema Neurologic: Alert and oriented X 3, normal strength and tone. Normal symmetric reflexes. Normal coordination and gait  ECOG PERFORMANCE STATUS: 1 - Symptomatic but completely ambulatory  Blood pressure 177/98, pulse 90, temperature 97.8 F (36.6 C), temperature source Oral, resp. rate 20, height 5' 8.5" (1.74 m), weight 192 lb 12.8 oz (87.454 kg).  LABORATORY DATA: Lab Results  Component Value Date   WBC 10.7* 04/30/2012   HGB 15.0 04/30/2012   HCT 44.9 04/30/2012   MCV 92.9 04/30/2012   PLT 204 04/30/2012      Chemistry   No results found for this basename: NA, K, CL, CO2, BUN, CREATININE, GLU   No results found for this basename: CALCIUM, ALKPHOS, AST, ALT, BILITOT       RADIOGRAPHIC STUDIES: Dg Chest 1 View  04/04/2012  *RADIOLOGY REPORT*  Clinical Data: Left lower lobe mass biopsy  CHEST - 1 VIEW  Comparison: 04/04/2012 CT images  Findings: Left lower lobe mass again noted.  No significant pneumothorax post biopsy.  The right hilar prominence with postobstructive atelectasis is again noted.  IMPRESSION: No pneumothorax post biopsy.   Original Report Authenticated By: Jearld Lesch, M.D.    Mr Laqueta Jean OZ Contrast  04/19/2012  *RADIOLOGY REPORT*  Clinical Data: Lung cancer.  Evaluate for metastatic disease.  MRI HEAD WITHOUT AND WITH CONTRAST  Technique:  Multiplanar,  multiecho pulse sequences of the brain and surrounding structures were obtained according to standard protocol without and with intravenous contrast  Contrast: 9mL MULTIHANCE GADOBENATE DIMEGLUMINE 529 MG/ML IV SOLN  BUN and creatinine were obtained on site at [facility] at [address] Results:  BUN 22.0 mg/dL,  Creatinine 1.6 mg/dL.  Comparison: None.  Findings: The patient had difficulty remaining motionless for the study.  Images are suboptimal.  Small or subtle lesions could be overlooked.  There is no evidence for acute infarction, intracranial hemorrhage, mass lesion, hydrocephalus, or extra-axial fluid.  Moderate atrophy.  Extensive chronic microvascular ischemic change affects the periventricular greater than subcortical white matter.  No foci of vasogenic edema. Tiny focus of chronic hemorrhage and left cerebellar white matter.  Dolichoectatic cerebral vasculature which is otherwise patent.  Marked bilateral middle terminate hypertrophy versus nasal polyposis.  No frontal or ethmoid sinus air-fluid levels.  Partial empty sella.  Negative cerebellar tonsils and upper cervical region.  Post infusion, there is no definite abnormal enhancement of the brain or meninges.  Negative orbits.  No skull base or osseous lesions.  IMPRESSION: No intracranial metastatic disease is observed.  Atrophy and chronic microvascular ischemic change.  Partial empty sella.  Bilateral middle nasal turbinate hypertrophy versus polyps.  No visible osseous lesions.   Original Report Authenticated By: Davonna Belling, M.D.    Ct Biopsy  04/04/2012  *RADIOLOGY REPORT*  Clinical Data: Left lower lobe 3.4 cm mass, PET positive, concerning for malignancy  CT FLUOROSCOPY GUIDED LEFT LOWER LOBE MASS 18 GAUGE CORE BIOPSIES  Date:  04/04/2012 09:00:00  Radiologist:  M. Ruel Favors, M.D.  Medications:  2 mg Versed, 50 mcg Fentanyl  Guidance:  CT fluoroscopy  Fluoroscopy time:  15 seconds  Sedation time:  15 minutes  Contrast volume:  None.  Complications:  No immediate  PROCEDURE/FINDINGS:  Informed consent was obtained from the patient following explanation of the procedure, risks, benefits and alternatives. The patient understands, agrees and consents for the procedure. All questions were addressed.  A time out  was performed.  Maximal barrier sterile technique utilized including caps, mask, sterile gowns, sterile gloves, large sterile drape, hand hygiene, and betadine  Previous imaging reviewed.  The patient was positioned prone. Initial noncontrast localization CT performed through the chest. Left lower lobe mass-like opacity was localized.  This correlates with the PET scan.  Utilizing CT fluoroscopy, a 17 gauge 6.8 cm access needle was advanced from a posterior lateral approach into the lesion.  Needle position confirmed along the peripheral edge of the mass with CT fluoroscopy.  3  18 gauge core biopsies were obtained of the lesion.  These were placed in formalin.  Needle removed.  Post procedure imaging demonstrates no evidence of hemorrhage or hematoma.  The patient tolerated the procedure well.  IMPRESSION: Successful CT fluoroscopy guided left lower lobe mass 18 gauge core biopsies.   Original Report Authenticated By: Judie Petit. Shick, M.D.     ASSESSMENT: This is a very pleasant 77 years old white male with stage IIIB non-small cell lung cancer, adenocarcinoma with negative EGFR mutation and negative ALK gene translocation.   PLAN: The patient is doing fine today. He has no evidence for metastatic brain disease. I discussed the MRI results with the patient and his wife. I recommended for him to proceed with concurrent chemoradiation today with weekly carboplatin and paclitaxel as scheduled. He would come back for followup visit in 2 weeks. The patient was advised to call immediately she  has any concerning symptoms in the interval. I will call his pharmacy with Compazine 10 mg by mouth every 6 hours as needed for nausea.  All questions were answered. The patient knows to call the clinic with any problems, questions or concerns. We can certainly see the patient much sooner if necessary.  I spent 15 minutes counseling the patient face to face. The total time spent in the appointment was 25 minutes.

## 2012-04-30 NOTE — Telephone Encounter (Signed)
Per staff phone call I have adjusted appt for 12/16. JMW

## 2012-04-30 NOTE — Addendum Note (Signed)
Addended by: Caren Griffins on: 04/30/2012 02:30 PM   Modules accepted: Orders

## 2012-05-01 ENCOUNTER — Telehealth: Payer: Self-pay | Admitting: *Deleted

## 2012-05-01 ENCOUNTER — Ambulatory Visit: Payer: Medicare Other

## 2012-05-01 NOTE — Telephone Encounter (Signed)
ENCOURAGED PT. TO FORCE FLUIDS. 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 HE WILL REFER TO IT AS NEEDED. NO PROBLEMS OR QUESTIONS AT THIS TIME. PT. WILL CALL THIS OFFICE IF NECESSARY. REMINDED PT. THIS OFFICE HAS A PHYSICIAN ON CALL, HE VOICES UNDERSTANDING.

## 2012-05-01 NOTE — Telephone Encounter (Signed)
Message left requesting a return call for dhemo f/u.  Awaiting return call from patient.

## 2012-05-01 NOTE — Telephone Encounter (Signed)
Message copied by Augusto Garbe on Tue May 01, 2012  3:44 PM ------      Message from: Rexene Edison      Created: Mon Apr 30, 2012  3:16 PM      Regarding: follow-up      Contact: [phone]       1st time Taxol and Carboplatin. Dr. Arbutus Ped.

## 2012-05-02 ENCOUNTER — Ambulatory Visit
Admission: RE | Admit: 2012-05-02 | Discharge: 2012-05-02 | Disposition: A | Discharge status: Home / Self Care | Payer: Medicare Other | Source: Ambulatory Visit | Attending: Radiation Oncology | Admitting: Radiation Oncology

## 2012-05-02 ENCOUNTER — Ambulatory Visit: Payer: Medicare Other

## 2012-05-03 ENCOUNTER — Ambulatory Visit
Admission: RE | Admit: 2012-05-03 | Discharge: 2012-05-03 | Disposition: A | Discharge status: Home / Self Care | Payer: Medicare Other | Source: Ambulatory Visit | Attending: Radiation Oncology | Admitting: Radiation Oncology

## 2012-05-03 ENCOUNTER — Ambulatory Visit: Payer: Medicare Other

## 2012-05-04 ENCOUNTER — Encounter: Payer: Self-pay | Admitting: Radiation Oncology

## 2012-05-04 ENCOUNTER — Ambulatory Visit: Payer: Medicare Other

## 2012-05-04 ENCOUNTER — Ambulatory Visit
Admission: RE | Admit: 2012-05-04 | Discharge: 2012-05-04 | Disposition: A | Discharge status: Home / Self Care | Payer: Medicare Other | Source: Ambulatory Visit | Attending: Radiation Oncology | Admitting: Radiation Oncology

## 2012-05-04 VITALS — BP 141/93 | HR 100 | Temp 98.2°F | Resp 20 | Wt 193.8 lb

## 2012-05-04 DIAGNOSIS — C3490 Malignant neoplasm of unspecified part of unspecified bronchus or lung: Secondary | ICD-10-CM

## 2012-05-04 MED ORDER — RADIAPLEXRX EX GEL
Freq: Once | CUTANEOUS | Status: AC
  Administered 2012-05-04: 09:00:00 via TOPICAL

## 2012-05-04 MED ORDER — PROMETHAZINE-CODEINE 6.25-10 MG/5ML PO SYRP: 5.0000 mL | ORAL_SOLUTION | ORAL | Status: DC | PRN

## 2012-05-04 NOTE — Progress Notes (Signed)
Post sim ed completed w/pt and wife. Gave pt "Radiation and You " booklet w/all pertinent pages marked and discussed, re: fatigue, skin irritation/care, throat pain, difficulty/painful swallowing, nutrition, pain. No questions verbalized, verbalized understanding. Will schedule nutrition appt for pt; pt aware.  Pt denies pain, fatigue, loss of appetite, does report his baseline SOB w/minimal exertion, congested productive cough w/clear sputum. Cough wakes pt  At night. He has taken OTC Robitussin w/fair relief. Pt states he has not taken BP med this morning.

## 2012-05-04 NOTE — Addendum Note (Signed)
Encounter addended by: Glennie Hawk, RN on: 05/04/2012  1:19 PM<BR>     Documentation filed: Inpatient MAR, Orders

## 2012-05-04 NOTE — Addendum Note (Signed)
Encounter addended by: Glennie Hawk, RN on: 05/04/2012 10:58 AM<BR>     Documentation filed: Inpatient Patient Education

## 2012-05-04 NOTE — Progress Notes (Signed)
  Radiation Oncology         [phone]) [phone] ________________________________  Name: Logan Russell MRN: [id number]  Date: 05/04/2012  DOB: 1934-09-23  Weekly Radiation Therapy Management  Current Dose: 6 Gy     Planned Dose:  66 Gy  Narrative . . . . . . . . The patient presents for routine under treatment assessment. Post sim ed completed w/pt and wife. Gave pt "Radiation and You " booklet w/all pertinent pages marked and discussed, re: fatigue, skin irritation/care, throat pain, difficulty/painful swallowing, nutrition, pain. No questions verbalized, verbalized understanding. Will schedule nutrition appt for pt; pt aware.  Pt denies pain, fatigue, loss of appetite, does report his baseline SOB w/minimal exertion, congested productive cough w/clear sputum. Cough wakes pt At night. He has taken OTC Robitussin w/fair relief.  Pt states he has not taken BP med this morning The patient is without complaint.                                 Set-up films were reviewed.                                 The chart was checked. Physical Findings. . .  weight is 193 lb 12.8 oz (87.907 kg). His oral temperature is 98.2 F (36.8 C). His blood pressure is 141/93 and his pulse is 100. His respiration is 20 and oxygen saturation is 97%. . Weight essentially stable.  No significant changes. Impression . . . . . . . The patient is  tolerating radiation. Plan . . . . . . . . . . . . Continue treatment as planned.  Refilled cough syrup.  ________________________________  Artist Pais. Kathrynn Running, M.D.

## 2012-05-07 ENCOUNTER — Ambulatory Visit: Payer: Medicare Other

## 2012-05-07 ENCOUNTER — Other Ambulatory Visit (HOSPITAL_BASED_OUTPATIENT_CLINIC_OR_DEPARTMENT_OTHER): Payer: Medicare Other

## 2012-05-07 ENCOUNTER — Ambulatory Visit (HOSPITAL_BASED_OUTPATIENT_CLINIC_OR_DEPARTMENT_OTHER): Payer: Medicare Other

## 2012-05-07 ENCOUNTER — Ambulatory Visit
Admission: RE | Admit: 2012-05-07 | Discharge: 2012-05-07 | Disposition: A | Discharge status: Home / Self Care | Payer: Medicare Other | Source: Ambulatory Visit | Attending: Radiation Oncology | Admitting: Radiation Oncology

## 2012-05-07 VITALS — BP 142/76 | HR 54 | Temp 97.9°F | Resp 20

## 2012-05-07 DIAGNOSIS — C3410 Malignant neoplasm of upper lobe, unspecified bronchus or lung: Secondary | ICD-10-CM

## 2012-05-07 DIAGNOSIS — C3490 Malignant neoplasm of unspecified part of unspecified bronchus or lung: Secondary | ICD-10-CM

## 2012-05-07 DIAGNOSIS — Z5111 Encounter for antineoplastic chemotherapy: Secondary | ICD-10-CM

## 2012-05-07 LAB — CBC WITH DIFFERENTIAL/PLATELET
BASO%: 0.3 % (ref 0.0–2.0)
Basophils Absolute: 0 10*3/uL (ref 0.0–0.1)
Eosinophils Absolute: 0.2 10*3/uL (ref 0.0–0.5)
HCT: 44.1 % (ref 38.4–49.9)
HGB: 15 g/dL (ref 13.0–17.1)
LYMPH%: 10.4 % — ABNORMAL LOW (ref 14.0–49.0)
MONO#: 0.5 10*3/uL (ref 0.1–0.9)
NEUT%: 81.9 % — ABNORMAL HIGH (ref 39.0–75.0)
Platelets: 243 10*3/uL (ref 140–400)
WBC: 10.3 10*3/uL (ref 4.0–10.3)
lymph#: 1.1 10*3/uL (ref 0.9–3.3)

## 2012-05-07 MED ORDER — PACLITAXEL CHEMO INJECTION 300 MG/50ML
45.0000 mg/m2 | Freq: Once | INTRAVENOUS | Status: AC
  Administered 2012-05-07: 96 mg via INTRAVENOUS
  Filled 2012-05-07: qty 16

## 2012-05-07 MED ORDER — DIPHENHYDRAMINE HCL 50 MG/ML IJ SOLN
50.0000 mg | Freq: Once | INTRAMUSCULAR | Status: AC
  Administered 2012-05-07: 50 mg via INTRAVENOUS

## 2012-05-07 MED ORDER — DEXAMETHASONE SODIUM PHOSPHATE 4 MG/ML IJ SOLN
20.0000 mg | Freq: Once | INTRAMUSCULAR | Status: AC
  Administered 2012-05-07: 20 mg via INTRAVENOUS

## 2012-05-07 MED ORDER — SODIUM CHLORIDE 0.9 % IV SOLN
Freq: Once | INTRAVENOUS | Status: AC
  Administered 2012-05-07: 11:00:00 via INTRAVENOUS

## 2012-05-07 MED ORDER — ONDANSETRON 16 MG/50ML IVPB (CHCC)
16.0000 mg | Freq: Once | INTRAVENOUS | Status: AC
  Administered 2012-05-07: 16 mg via INTRAVENOUS

## 2012-05-07 MED ORDER — FAMOTIDINE IN NACL 20-0.9 MG/50ML-% IV SOLN
20.0000 mg | Freq: Once | INTRAVENOUS | Status: AC
  Administered 2012-05-07: 20 mg via INTRAVENOUS

## 2012-05-07 MED ORDER — SODIUM CHLORIDE 0.9 % IV SOLN
144.0000 mg | Freq: Once | INTRAVENOUS | Status: AC
  Administered 2012-05-07: 140 mg via INTRAVENOUS
  Filled 2012-05-07: qty 14

## 2012-05-07 NOTE — Patient Instructions (Signed)
[facility] Discharge Instructions for Patients Receiving Chemotherapy  Today you received the following chemotherapy agents Taxol/Carboplatin  To help prevent nausea and vomiting after your treatment, we encourage you to take your nausea medication   If you develop nausea and vomiting that is not controlled by your nausea medication, call the clinic. If it is after clinic hours your family physician or the after hours number for the clinic or go to the Emergency Department.   BELOW ARE SYMPTOMS THAT SHOULD BE REPORTED IMMEDIATELY:  *FEVER GREATER THAN 100.5 F  *CHILLS WITH OR WITHOUT FEVER  NAUSEA AND VOMITING THAT IS NOT CONTROLLED WITH YOUR NAUSEA MEDICATION  *UNUSUAL SHORTNESS OF BREATH  *UNUSUAL BRUISING OR BLEEDING  TENDERNESS IN MOUTH AND THROAT WITH OR WITHOUT PRESENCE OF ULCERS  *URINARY PROBLEMS  *BOWEL PROBLEMS  UNUSUAL RASH Items with * indicate a potential emergency and should be followed up as soon as possible.  One of the nurses will contact you 24 hours after your treatment. Please let the nurse know about any problems that you may have experienced. Feel free to call the clinic you have any questions or concerns. The clinic phone number is [phone].   I have been informed and understand all the instructions given to me. I know to contact the clinic, my physician, or go to the Emergency Department if any problems should occur. I do not have any questions at this time, but understand that I may call the clinic during office hours   should I have any questions or need assistance in obtaining follow up care.    __________________________________________  _____________  __________ Signature of Patient or Authorized Representative            Date                   Time    __________________________________________ Nurse's Signature    

## 2012-05-08 ENCOUNTER — Ambulatory Visit
Admission: RE | Admit: 2012-05-08 | Discharge: 2012-05-08 | Disposition: A | Discharge status: Home / Self Care | Payer: Medicare Other | Source: Ambulatory Visit | Attending: Radiation Oncology | Admitting: Radiation Oncology

## 2012-05-08 ENCOUNTER — Ambulatory Visit: Payer: Medicare Other

## 2012-05-09 ENCOUNTER — Ambulatory Visit: Payer: Medicare Other

## 2012-05-09 ENCOUNTER — Ambulatory Visit
Admission: RE | Admit: 2012-05-09 | Discharge: 2012-05-09 | Disposition: A | Discharge status: Home / Self Care | Payer: Medicare Other | Source: Ambulatory Visit | Attending: Radiation Oncology | Admitting: Radiation Oncology

## 2012-05-09 ENCOUNTER — Encounter: Payer: Self-pay | Admitting: Radiation Oncology

## 2012-05-09 VITALS — BP 154/92 | HR 95 | Temp 98.2°F | Resp 20 | Wt 195.5 lb

## 2012-05-09 DIAGNOSIS — C3490 Malignant neoplasm of unspecified part of unspecified bronchus or lung: Secondary | ICD-10-CM

## 2012-05-09 NOTE — Progress Notes (Addendum)
Pt has prod cough w/clear sputum, his baseline SOB w/minimal activity, denies pain, loss of appetite, fatigue. Taking Phenergan/Codeine for cough w/good relief.

## 2012-05-09 NOTE — Progress Notes (Signed)
  Radiation Oncology         [phone] ________________________________  Name: Logan Russell MRN: [id number]  Date: 05/09/2012  DOB: Apr 25, 1935  Weekly Radiation Therapy Management  Current Dose: 12 Gy     Planned Dose:  66 Gy  Narrative . . . . . . . . The patient presents for routine under treatment assessment.                                         Pt has prod cough w/clear sputum, his baseline SOB w/minimal activity, denies pain, loss of appetite, fatigue. Taking Phenergan/Codeine for cough w/good relief                                 Set-up films were reviewed.                                 The chart was checked. Physical Findings. . .  weight is 195 lb 8 oz (88.678 kg). His oral temperature is 98.2 F (36.8 C). His blood pressure is 154/92 and his pulse is 95. His respiration is 20 and oxygen saturation is 98%. . Weight essentially stable.  No significant changes. Impression . . . . . . . The patient is  tolerating radiation. Plan . . . . . . . . . . . . Continue treatment as planned.  ________________________________  Artist Pais. Kathrynn Running, M.D.

## 2012-05-10 ENCOUNTER — Ambulatory Visit
Admission: RE | Admit: 2012-05-10 | Discharge: 2012-05-10 | Disposition: A | Discharge status: Home / Self Care | Payer: Medicare Other | Source: Ambulatory Visit | Attending: Radiation Oncology | Admitting: Radiation Oncology

## 2012-05-10 ENCOUNTER — Ambulatory Visit: Payer: Medicare Other

## 2012-05-11 ENCOUNTER — Ambulatory Visit: Payer: Medicare Other

## 2012-05-11 ENCOUNTER — Ambulatory Visit
Admission: RE | Admit: 2012-05-11 | Discharge: 2012-05-11 | Disposition: A | Discharge status: Home / Self Care | Payer: Medicare Other | Source: Ambulatory Visit | Attending: Radiation Oncology | Admitting: Radiation Oncology

## 2012-05-14 ENCOUNTER — Ambulatory Visit: Payer: Medicare Other

## 2012-05-14 ENCOUNTER — Ambulatory Visit (HOSPITAL_BASED_OUTPATIENT_CLINIC_OR_DEPARTMENT_OTHER): Payer: Medicare Other

## 2012-05-14 ENCOUNTER — Ambulatory Visit
Admission: RE | Admit: 2012-05-14 | Discharge: 2012-05-14 | Disposition: A | Discharge status: Home / Self Care | Payer: Medicare Other | Source: Ambulatory Visit | Attending: Radiation Oncology | Admitting: Radiation Oncology

## 2012-05-14 ENCOUNTER — Other Ambulatory Visit (HOSPITAL_BASED_OUTPATIENT_CLINIC_OR_DEPARTMENT_OTHER): Payer: Medicare Other

## 2012-05-14 ENCOUNTER — Other Ambulatory Visit: Payer: Medicare Other | Admitting: Lab

## 2012-05-14 ENCOUNTER — Ambulatory Visit (HOSPITAL_BASED_OUTPATIENT_CLINIC_OR_DEPARTMENT_OTHER): Payer: Medicare Other | Admitting: Physician Assistant

## 2012-05-14 VITALS — BP 153/95 | HR 65 | Temp 98.3°F | Resp 20 | Ht 68.5 in | Wt 193.8 lb

## 2012-05-14 DIAGNOSIS — R05 Cough: Secondary | ICD-10-CM

## 2012-05-14 DIAGNOSIS — C3430 Malignant neoplasm of lower lobe, unspecified bronchus or lung: Secondary | ICD-10-CM

## 2012-05-14 DIAGNOSIS — C3490 Malignant neoplasm of unspecified part of unspecified bronchus or lung: Secondary | ICD-10-CM

## 2012-05-14 DIAGNOSIS — Z5111 Encounter for antineoplastic chemotherapy: Secondary | ICD-10-CM

## 2012-05-14 LAB — CBC WITH DIFFERENTIAL/PLATELET
BASO%: 0.3 % (ref 0.0–2.0)
Basophils Absolute: 0 10*3/uL (ref 0.0–0.1)
EOS%: 1.1 % (ref 0.0–7.0)
HCT: 42.1 % (ref 38.4–49.9)
HGB: 14 g/dL (ref 13.0–17.1)
LYMPH%: 11.9 % — ABNORMAL LOW (ref 14.0–49.0)
MCH: 30.6 pg (ref 27.2–33.4)
MCHC: 33.3 g/dL (ref 32.0–36.0)
MCV: 91.9 fL (ref 79.3–98.0)
NEUT%: 81.4 % — ABNORMAL HIGH (ref 39.0–75.0)
Platelets: 157 10*3/uL (ref 140–400)
lymph#: 0.8 10*3/uL — ABNORMAL LOW (ref 0.9–3.3)

## 2012-05-14 MED ORDER — DEXAMETHASONE SODIUM PHOSPHATE 4 MG/ML IJ SOLN
20.0000 mg | Freq: Once | INTRAMUSCULAR | Status: AC
  Administered 2012-05-14: 20 mg via INTRAVENOUS

## 2012-05-14 MED ORDER — ONDANSETRON 16 MG/50ML IVPB (CHCC)
16.0000 mg | Freq: Once | INTRAVENOUS | Status: AC
  Administered 2012-05-14: 16 mg via INTRAVENOUS

## 2012-05-14 MED ORDER — FAMOTIDINE IN NACL 20-0.9 MG/50ML-% IV SOLN
20.0000 mg | Freq: Once | INTRAVENOUS | Status: AC
  Administered 2012-05-14: 20 mg via INTRAVENOUS

## 2012-05-14 MED ORDER — SODIUM CHLORIDE 0.9 % IV SOLN
Freq: Once | INTRAVENOUS | Status: AC
  Administered 2012-05-14: 12:00:00 via INTRAVENOUS

## 2012-05-14 MED ORDER — DIPHENHYDRAMINE HCL 50 MG/ML IJ SOLN
50.0000 mg | Freq: Once | INTRAMUSCULAR | Status: AC
  Administered 2012-05-14: 50 mg via INTRAVENOUS

## 2012-05-14 MED ORDER — CARBOPLATIN CHEMO INJECTION 450 MG/45ML
144.0000 mg | Freq: Once | INTRAVENOUS | Status: AC
  Administered 2012-05-14: 140 mg via INTRAVENOUS
  Filled 2012-05-14: qty 14

## 2012-05-14 MED ORDER — PACLITAXEL CHEMO INJECTION 300 MG/50ML
45.0000 mg/m2 | Freq: Once | INTRAVENOUS | Status: AC
  Administered 2012-05-14: 96 mg via INTRAVENOUS
  Filled 2012-05-14: qty 16

## 2012-05-14 NOTE — Patient Instructions (Signed)
[facility] Discharge Instructions for Patients Receiving Chemotherapy  Today you received the following chemotherapy agents Taxol and Carboplatin.  To help prevent nausea and vomiting after your treatment, we encourage you to take your nausea medication.   If you develop nausea and vomiting that is not controlled by your nausea medication, call the clinic. If it is after clinic hours your family physician or the after hours number for the clinic or go to the Emergency Department.   BELOW ARE SYMPTOMS THAT SHOULD BE REPORTED IMMEDIATELY:  *FEVER GREATER THAN 100.5 F  *CHILLS WITH OR WITHOUT FEVER  NAUSEA AND VOMITING THAT IS NOT CONTROLLED WITH YOUR NAUSEA MEDICATION  *UNUSUAL SHORTNESS OF BREATH  *UNUSUAL BRUISING OR BLEEDING  TENDERNESS IN MOUTH AND THROAT WITH OR WITHOUT PRESENCE OF ULCERS  *URINARY PROBLEMS  *BOWEL PROBLEMS  UNUSUAL RASH Items with * indicate a potential emergency and should be followed up as soon as possible.  One of the nurses will contact you 24 hours after your treatment. Please let the nurse know about any problems that you may have experienced. Feel free to call the clinic you have any questions or concerns. The clinic phone number is [phone].   I have been informed and understand all the instructions given to me. I know to contact the clinic, my physician, or go to the Emergency Department if any problems should occur. I do not have any questions at this time, but understand that I may call the clinic during office hours   should I have any questions or need assistance in obtaining follow up care.    __________________________________________  _____________  __________ Signature of Patient or Authorized Representative            Date                   Time    __________________________________________ Nurse's Signature    

## 2012-05-14 NOTE — Progress Notes (Signed)
[facility] [phone]   [phone]  OFFICE PROGRESS NOTE  [name], MD [address] [place] [address]  DIAGNOSIS: Stage IIIB (T2a., N3, M0) non-small cell lung cancer consistent with invasive adenocarcinoma with negative ALK gene translocation and EGFR mutation diagnosed in October of 2013  PRIOR THERAPY: None.  CURRENT THERAPY: Concurrent chemoradiation with weekly carboplatin for AUC of 2 and paclitaxel 45 mg/M2  INTERVAL HISTORY: Logan Russell 77 y.o. male returns to the clinic today for followup visit accompanied his wife. Thus far is tolerating his course of concurrent chemoradiation without difficulty. He does report a cough however he is unable to cough up any secretions. He denied any problems with nausea, vomiting, diarrhea, constipation, fever or chills.  He denied having any significant chest pain but continues to have shortness breath with exertion. The patient denied having any significant weight loss or night sweats.   MEDICAL HISTORY: Past Medical History  Diagnosis Date  . Hypertension   . Lung cancer 04/12/2012    ALLERGIES:   has no known allergies.  MEDICATIONS:  Current Outpatient Prescriptions  Medication Sig Dispense Refill  . promethazine-codeine (PHENERGAN WITH CODEINE) 6.25-10 MG/5ML syrup Take 5 mLs by mouth every 4 (four) hours as needed.  480 mL  5  . triamterene-hydrochlorothiazide (MAXZIDE) 75-50 MG per tablet Take 1 tablet by mouth daily.      . Wound Dressings (RADIAGEL) GEL Apply topically 2 (two) times daily.        REVIEW OF SYSTEMS:  A comprehensive review of systems was negative except for: Respiratory: positive for cough and dyspnea on exertion   PHYSICAL EXAMINATION: General appearance: alert, cooperative and no distress Head: Normocephalic, without obvious abnormality, atraumatic Neck: no adenopathy Resp: Coarse rhonchi at the right base otherwise good air movement  remaining lung fields Cardio: regular rate and rhythm, S1, S2 normal, no murmur, click, rub or gallop GI: soft, non-tender; bowel sounds normal; no masses,  no organomegaly Extremities: extremities normal, atraumatic, no cyanosis or edema Neurologic: Alert and oriented X 3, normal strength and tone. Normal symmetric reflexes. Normal coordination and gait  ECOG PERFORMANCE STATUS: 1 - Symptomatic but completely ambulatory  Blood pressure 153/95, pulse 65, temperature 98.3 F (36.8 C), temperature source Oral, resp. rate 20, height 5' 8.5" (1.74 m), weight 193 lb 12.8 oz (87.907 kg).  LABORATORY DATA: Lab Results  Component Value Date   WBC 7.0 05/14/2012   HGB 14.0 05/14/2012   HCT 42.1 05/14/2012   MCV 91.9 05/14/2012   PLT 157 05/14/2012      Chemistry      Component Value Date/Time   NA 142 04/30/2012 1005      Component Value Date/Time   CALCIUM 9.7 04/30/2012 1005       RADIOGRAPHIC STUDIES: Dg Chest 1 View  04/04/2012  *RADIOLOGY REPORT*  Clinical Data: Left lower lobe mass biopsy  CHEST - 1 VIEW  Comparison: 04/04/2012 CT images  Findings: Left lower lobe mass again noted.  No significant pneumothorax post biopsy.  The right hilar prominence with postobstructive atelectasis is again noted.  IMPRESSION: No pneumothorax post biopsy.   Original Report Authenticated By: Jearld Lesch, M.D.    Mr Laqueta Jean WJ Contrast  04/19/2012  *RADIOLOGY REPORT*  Clinical Data: Lung cancer.  Evaluate for metastatic disease.  MRI HEAD WITHOUT AND WITH CONTRAST  Technique:  Multiplanar, multiecho pulse sequences of the brain and surrounding structures were obtained according to standard  protocol without and with intravenous contrast  Contrast: 9mL MULTIHANCE GADOBENATE DIMEGLUMINE 529 MG/ML IV SOLN  BUN and creatinine were obtained on site at [facility] at [address] Results:  BUN 22.0 mg/dL,  Creatinine 1.6 mg/dL.  Comparison: None.  Findings: The patient had difficulty  remaining motionless for the study.  Images are suboptimal.  Small or subtle lesions could be overlooked.  There is no evidence for acute infarction, intracranial hemorrhage, mass lesion, hydrocephalus, or extra-axial fluid.  Moderate atrophy.  Extensive chronic microvascular ischemic change affects the periventricular greater than subcortical white matter.  No foci of vasogenic edema. Tiny focus of chronic hemorrhage and left cerebellar white matter.  Dolichoectatic cerebral vasculature which is otherwise patent.  Marked bilateral middle terminate hypertrophy versus nasal polyposis.  No frontal or ethmoid sinus air-fluid levels.  Partial empty sella.  Negative cerebellar tonsils and upper cervical region.  Post infusion, there is no definite abnormal enhancement of the brain or meninges.  Negative orbits.  No skull base or osseous lesions.  IMPRESSION: No intracranial metastatic disease is observed.  Atrophy and chronic microvascular ischemic change.  Partial empty sella.  Bilateral middle nasal turbinate hypertrophy versus polyps.  No visible osseous lesions.   Original Report Authenticated By: Davonna Belling, M.D.    Ct Biopsy  04/04/2012  *RADIOLOGY REPORT*  Clinical Data: Left lower lobe 3.4 cm mass, PET positive, concerning for malignancy  CT FLUOROSCOPY GUIDED LEFT LOWER LOBE MASS 18 GAUGE CORE BIOPSIES  Date:  04/04/2012 09:00:00  Radiologist:  M. Ruel Favors, M.D.  Medications:  2 mg Versed, 50 mcg Fentanyl  Guidance:  CT fluoroscopy  Fluoroscopy time:  15 seconds  Sedation time:  15 minutes  Contrast volume:  None.  Complications:  No immediate  PROCEDURE/FINDINGS:  Informed consent was obtained from the patient following explanation of the procedure, risks, benefits and alternatives. The patient understands, agrees and consents for the procedure. All questions were addressed.  A time out was performed.  Maximal barrier sterile technique utilized including caps, mask, sterile gowns, sterile gloves, large  sterile drape, hand hygiene, and betadine  Previous imaging reviewed.  The patient was positioned prone. Initial noncontrast localization CT performed through the chest. Left lower lobe mass-like opacity was localized.  This correlates with the PET scan.  Utilizing CT fluoroscopy, a 17 gauge 6.8 cm access needle was advanced from a posterior lateral approach into the lesion.  Needle position confirmed along the peripheral edge of the mass with CT fluoroscopy.  3  18 gauge core biopsies were obtained of the lesion.  These were placed in formalin.  Needle removed.  Post procedure imaging demonstrates no evidence of hemorrhage or hematoma.  The patient tolerated the procedure well.  IMPRESSION: Successful CT fluoroscopy guided left lower lobe mass 18 gauge core biopsies.   Original Report Authenticated By: Judie Petit. Shick, M.D.     ASSESSMENT/PLAN: This is a very pleasant 77 years old white male with stage IIIB non-small cell lung cancer, adenocarcinoma with negative EGFR mutation and negative ALK gene translocation. He is currently undergoing a course of concurrent chemoradiation. As far is tolerating his course of current chemoradiation without difficulty. Patient was discussed with Dr. Arbutus Ped. He'll continue on his current course of therapy and return in 2 weeks for another symptom management visit. He was advised to take Mucinex with plenty of water to address his cough with secretions he is unable to cough up.  Laural Benes, Torey Reinard E, PA-C   All questions were answered.  The patient knows to call the clinic with any problems, questions or concerns. We can certainly see the patient much sooner if necessary.  I spent 20 minutes counseling the patient face to face. The total time spent in the appointment was 30 minutes.

## 2012-05-14 NOTE — Patient Instructions (Addendum)
Continue weekly labs, chemotherapy and radiation. Followup in 2 weeks for another symptom management visit.

## 2012-05-15 ENCOUNTER — Ambulatory Visit
Admission: RE | Admit: 2012-05-15 | Discharge: 2012-05-15 | Disposition: A | Discharge status: Home / Self Care | Payer: Medicare Other | Source: Ambulatory Visit | Attending: Radiation Oncology | Admitting: Radiation Oncology

## 2012-05-15 ENCOUNTER — Ambulatory Visit: Payer: Medicare Other | Admitting: Nutrition

## 2012-05-15 ENCOUNTER — Ambulatory Visit: Payer: Medicare Other

## 2012-05-15 NOTE — Progress Notes (Signed)
This is a 77 year old male patient of Dr. Shirline Frees and Dr. Kathrynn Running diagnosed with non-small cell lung cancer receiving concurrent chemoradiation therapy.  Past medical history includes hypertension.  Medications include Compazine, Phenergan, and Robitussin.  Labs were reviewed.  Height: 68.5 inches. Weight: 193.8 pounds December 16. Usual body weight: 200 pounds. BMI: 29.04.  Patient denies nutritional side effects at this time. He does admit to weight loss but denies difficulty eating.  Nutrition diagnosis: Predicted suboptimal energy intake related to new diagnosis of non-small cell lung cancer and associated treatments as evidenced by the history or presence of this condition for which research shows an increased incidence of suboptimal energy intake.  Intervention: Educated patient and friend on the importance of smaller more frequent meals with increased protein and enough calories for weight maintenance. I have educated him on protein sources. I provided samples of oral nutrition supplements for patient to try if/or when his throat becomes too sore to swallow. I provided fact sheets and my contact information.  Monitoring, evaluation, goals: Patient will tolerate adequate oral intake to promote maintenance of lean body mass.  Next visit: Patient prefers to contact me for questions or concerns

## 2012-05-16 ENCOUNTER — Ambulatory Visit
Admission: RE | Admit: 2012-05-16 | Discharge: 2012-05-16 | Disposition: A | Discharge status: Home / Self Care | Payer: Medicare Other | Source: Ambulatory Visit | Attending: Radiation Oncology | Admitting: Radiation Oncology

## 2012-05-16 ENCOUNTER — Telehealth: Payer: Self-pay | Admitting: Internal Medicine

## 2012-05-16 ENCOUNTER — Ambulatory Visit: Payer: Medicare Other

## 2012-05-16 NOTE — Telephone Encounter (Signed)
S/W THE PT'S WIFE AND SHE IS AWARE THAT THE PA WILL BE SEEING THE PT WHILE  =HE IS IN [place] ON 05/28/2012.

## 2012-05-17 ENCOUNTER — Ambulatory Visit
Admission: RE | Admit: 2012-05-17 | Discharge: 2012-05-17 | Disposition: A | Discharge status: Home / Self Care | Payer: Medicare Other | Source: Ambulatory Visit | Attending: Radiation Oncology | Admitting: Radiation Oncology

## 2012-05-17 ENCOUNTER — Ambulatory Visit: Payer: Medicare Other

## 2012-05-17 ENCOUNTER — Encounter: Payer: Self-pay | Admitting: Radiation Oncology

## 2012-05-17 VITALS — BP 176/90 | HR 103 | Temp 98.1°F | Resp 20

## 2012-05-17 DIAGNOSIS — C3490 Malignant neoplasm of unspecified part of unspecified bronchus or lung: Secondary | ICD-10-CM

## 2012-05-17 NOTE — Progress Notes (Signed)
   Department of Radiation Oncology  Phone:  [phone] Fax:        [phone]  Weekly Treatment Note    Name: Logan Russell Date: 05/17/2012 MRN: [id number] DOB: Jul 31, 1934   Current dose: 24 Gy  Current fraction: 12   MEDICATIONS: Current Outpatient Prescriptions  Medication Sig Dispense Refill  . guaiFENesin (MUCINEX) 600 MG 12 hr tablet Take 1,200 mg by mouth 2 (two) times daily.      . promethazine-codeine (PHENERGAN WITH CODEINE) 6.25-10 MG/5ML syrup Take 5 mLs by mouth every 4 (four) hours as needed.  480 mL  5  . triamterene-hydrochlorothiazide (MAXZIDE) 75-50 MG per tablet Take 1 tablet by mouth daily.      . Wound Dressings (RADIAGEL) GEL Apply topically 2 (two) times daily.         ALLERGIES: Review of patient's allergies indicates no known allergies.   LABORATORY DATA:  Lab Results  Component Value Date   WBC 7.0 05/14/2012   HGB 14.0 05/14/2012   HCT 42.1 05/14/2012   MCV 91.9 05/14/2012   PLT 157 05/14/2012   Lab Results  Component Value Date   NA 142 04/30/2012   K 4.6 04/30/2012   CL 101 04/30/2012   CO2 29 04/30/2012   Lab Results  Component Value Date   ALT 20 04/30/2012   AST 17 04/30/2012   ALKPHOS 82 04/30/2012   BILITOT 0.51 04/30/2012     NARRATIVE: Logan Russell was seen today for weekly treatment management. The chart was checked and the patient's films were reviewed. The patient states that he has been stable. He denies any change in breathing. He has a continued occasional productive cough with clear sputum. He denies any pain/esophagitis. He states that he did begin to have some hoarseness yesterday.  PHYSICAL EXAMINATION: oral temperature is 98.1 F (36.7 C). His blood pressure is 176/90 and his pulse is 103. His respiration is 20 and oxygen saturation is 100%.        ASSESSMENT: The patient is doing satisfactorily with treatment.  PLAN: We will continue with the patient's radiation treatment as planned. I believe the patient is  doing well. He is to let us know if his hoarseness continues. This has not been going on for very long and no other significant changes.

## 2012-05-17 NOTE — Progress Notes (Signed)
Pt denies pain, loss of appetite. Pt has productive cough w/clear sputum, fatigued, SOB w/minimal activity.

## 2012-05-18 ENCOUNTER — Ambulatory Visit
Admission: RE | Admit: 2012-05-18 | Discharge: 2012-05-18 | Disposition: A | Discharge status: Home / Self Care | Payer: Medicare Other | Source: Ambulatory Visit | Attending: Radiation Oncology | Admitting: Radiation Oncology

## 2012-05-18 ENCOUNTER — Ambulatory Visit: Payer: Medicare Other

## 2012-05-21 ENCOUNTER — Ambulatory Visit (HOSPITAL_BASED_OUTPATIENT_CLINIC_OR_DEPARTMENT_OTHER): Payer: Medicare Other

## 2012-05-21 ENCOUNTER — Ambulatory Visit
Admission: RE | Admit: 2012-05-21 | Discharge: 2012-05-21 | Disposition: A | Discharge status: Home / Self Care | Payer: Medicare Other | Source: Ambulatory Visit | Attending: Radiation Oncology | Admitting: Radiation Oncology

## 2012-05-21 ENCOUNTER — Other Ambulatory Visit (HOSPITAL_BASED_OUTPATIENT_CLINIC_OR_DEPARTMENT_OTHER): Payer: Medicare Other

## 2012-05-21 ENCOUNTER — Ambulatory Visit: Payer: Medicare Other

## 2012-05-21 VITALS — BP 131/79 | HR 105 | Temp 98.2°F

## 2012-05-21 DIAGNOSIS — C3490 Malignant neoplasm of unspecified part of unspecified bronchus or lung: Secondary | ICD-10-CM

## 2012-05-21 DIAGNOSIS — Z5111 Encounter for antineoplastic chemotherapy: Secondary | ICD-10-CM

## 2012-05-21 DIAGNOSIS — C3430 Malignant neoplasm of lower lobe, unspecified bronchus or lung: Secondary | ICD-10-CM

## 2012-05-21 LAB — COMPREHENSIVE METABOLIC PANEL (CC13)
Albumin: 3.5 g/dL (ref 3.5–5.0)
BUN: 32 mg/dL — ABNORMAL HIGH (ref 7.0–26.0)
Calcium: 9.3 mg/dL (ref 8.4–10.4)
Chloride: 100 mEq/L (ref 98–107)
Creatinine: 1.3 mg/dL (ref 0.7–1.3)
Glucose: 100 mg/dl — ABNORMAL HIGH (ref 70–99)
Potassium: 3.2 mEq/L — ABNORMAL LOW (ref 3.5–5.1)

## 2012-05-21 LAB — CBC WITH DIFFERENTIAL/PLATELET
BASO%: 0.4 % (ref 0.0–2.0)
Basophils Absolute: 0 10*3/uL (ref 0.0–0.1)
EOS%: 0.5 % (ref 0.0–7.0)
HCT: 41.8 % (ref 38.4–49.9)
HGB: 14.3 g/dL (ref 13.0–17.1)
LYMPH%: 10 % — ABNORMAL LOW (ref 14.0–49.0)
MCH: 30.7 pg (ref 27.2–33.4)
MCHC: 34.2 g/dL (ref 32.0–36.0)
MONO#: 0.4 10*3/uL (ref 0.1–0.9)
NEUT%: 81.8 % — ABNORMAL HIGH (ref 39.0–75.0)
Platelets: 125 10*3/uL — ABNORMAL LOW (ref 140–400)
lymph#: 0.6 10*3/uL — ABNORMAL LOW (ref 0.9–3.3)

## 2012-05-21 MED ORDER — ONDANSETRON 16 MG/50ML IVPB (CHCC)
16.0000 mg | Freq: Once | INTRAVENOUS | Status: AC
  Administered 2012-05-21: 16 mg via INTRAVENOUS

## 2012-05-21 MED ORDER — FAMOTIDINE IN NACL 20-0.9 MG/50ML-% IV SOLN
20.0000 mg | Freq: Once | INTRAVENOUS | Status: AC
  Administered 2012-05-21: 20 mg via INTRAVENOUS

## 2012-05-21 MED ORDER — SODIUM CHLORIDE 0.9 % IV SOLN
Freq: Once | INTRAVENOUS | Status: AC
  Administered 2012-05-21: 11:00:00 via INTRAVENOUS

## 2012-05-21 MED ORDER — PACLITAXEL CHEMO INJECTION 300 MG/50ML
45.0000 mg/m2 | Freq: Once | INTRAVENOUS | Status: AC
  Administered 2012-05-21: 96 mg via INTRAVENOUS
  Filled 2012-05-21: qty 16

## 2012-05-21 MED ORDER — SODIUM CHLORIDE 0.9 % IV SOLN
144.0000 mg | Freq: Once | INTRAVENOUS | Status: AC
  Administered 2012-05-21: 140 mg via INTRAVENOUS
  Filled 2012-05-21: qty 14

## 2012-05-21 MED ORDER — DEXAMETHASONE SODIUM PHOSPHATE 4 MG/ML IJ SOLN
20.0000 mg | Freq: Once | INTRAMUSCULAR | Status: AC
  Administered 2012-05-21: 20 mg via INTRAVENOUS

## 2012-05-21 MED ORDER — DIPHENHYDRAMINE HCL 50 MG/ML IJ SOLN
50.0000 mg | Freq: Once | INTRAMUSCULAR | Status: AC
  Administered 2012-05-21: 50 mg via INTRAVENOUS

## 2012-05-21 NOTE — Patient Instructions (Signed)
[facility] Discharge Instructions for Patients Receiving Chemotherapy  Today you received the following chemotherapy agents Taxol and Carboplatin  To help prevent nausea and vomiting after your treatment, we encourage you to take your nausea medication as prescribed.   If you develop nausea and vomiting that is not controlled by your nausea medication, call the clinic. If it is after clinic hours your family physician or the after hours number for the clinic or go to the Emergency Department.   BELOW ARE SYMPTOMS THAT SHOULD BE REPORTED IMMEDIATELY:  *FEVER GREATER THAN 100.5 F  *CHILLS WITH OR WITHOUT FEVER  NAUSEA AND VOMITING THAT IS NOT CONTROLLED WITH YOUR NAUSEA MEDICATION  *UNUSUAL SHORTNESS OF BREATH  *UNUSUAL BRUISING OR BLEEDING  TENDERNESS IN MOUTH AND THROAT WITH OR WITHOUT PRESENCE OF ULCERS  *URINARY PROBLEMS  *BOWEL PROBLEMS  UNUSUAL RASH Items with * indicate a potential emergency and should be followed up as soon as possible.  Feel free to call the clinic you have any questions or concerns. The clinic phone number is [phone].   I have been informed and understand all the instructions given to me. I know to contact the clinic, my physician, or go to the Emergency Department if any problems should occur. I do not have any questions at this time, but understand that I may call the clinic during office hours   should I have any questions or need assistance in obtaining follow up care.

## 2012-05-22 ENCOUNTER — Ambulatory Visit: Payer: Medicare Other

## 2012-05-22 ENCOUNTER — Ambulatory Visit
Admission: RE | Admit: 2012-05-22 | Discharge: 2012-05-22 | Disposition: A | Discharge status: Home / Self Care | Payer: Medicare Other | Source: Ambulatory Visit | Attending: Radiation Oncology | Admitting: Radiation Oncology

## 2012-05-24 ENCOUNTER — Ambulatory Visit: Payer: Medicare Other

## 2012-05-24 ENCOUNTER — Ambulatory Visit
Admission: RE | Admit: 2012-05-24 | Discharge: 2012-05-24 | Disposition: A | Discharge status: Home / Self Care | Payer: Medicare Other | Source: Ambulatory Visit | Attending: Radiation Oncology | Admitting: Radiation Oncology

## 2012-05-25 ENCOUNTER — Ambulatory Visit
Admission: RE | Admit: 2012-05-25 | Discharge: 2012-05-25 | Disposition: A | Discharge status: Home / Self Care | Payer: Medicare Other | Source: Ambulatory Visit | Attending: Radiation Oncology | Admitting: Radiation Oncology

## 2012-05-25 ENCOUNTER — Encounter: Payer: Self-pay | Admitting: Radiation Oncology

## 2012-05-25 ENCOUNTER — Ambulatory Visit: Payer: Medicare Other

## 2012-05-25 VITALS — BP 155/102 | HR 104 | Temp 98.1°F | Resp 20 | Wt 193.9 lb

## 2012-05-25 DIAGNOSIS — C3490 Malignant neoplasm of unspecified part of unspecified bronchus or lung: Secondary | ICD-10-CM

## 2012-05-25 NOTE — Progress Notes (Signed)
Pt denies pain, fatigue, loss of appetite, esophagitis. Pt continues to have his baseline productive cough w/clear sputum, taking Mucinex. SOB w/minimal activity. Pt hypertensive, states he forgot to take BP med this morning. Pt states he will take asap when he gets home.

## 2012-05-25 NOTE — Progress Notes (Signed)
   Department of Radiation Oncology  Phone:  [phone] Fax:        [phone]  Weekly Treatment Note    Name: Logan Russell Date: 05/25/2012 MRN: [id number] DOB: 1935-03-10   Current dose: 34 Gy  Current fraction: 17   MEDICATIONS: Current Outpatient Prescriptions  Medication Sig Dispense Refill  . guaiFENesin (MUCINEX) 600 MG 12 hr tablet Take 1,200 mg by mouth 2 (two) times daily.      . promethazine-codeine (PHENERGAN WITH CODEINE) 6.25-10 MG/5ML syrup Take 5 mLs by mouth every 4 (four) hours as needed.  480 mL  5  . triamterene-hydrochlorothiazide (MAXZIDE) 75-50 MG per tablet Take 1 tablet by mouth daily.      . Wound Dressings (RADIAGEL) GEL Apply topically 2 (two) times daily.         ALLERGIES: Review of patient's allergies indicates no known allergies.   LABORATORY DATA:  Lab Results  Component Value Date   WBC 5.6 05/21/2012   HGB 14.3 05/21/2012   HCT 41.8 05/21/2012   MCV 89.7 05/21/2012   PLT 125* 05/21/2012   Lab Results  Component Value Date   NA 138 05/21/2012   K 3.2* 05/21/2012   CL 100 05/21/2012   CO2 27 05/21/2012   Lab Results  Component Value Date   ALT 18 05/21/2012   AST 13 05/21/2012   ALKPHOS 70 05/21/2012   BILITOT 0.66 05/21/2012     NARRATIVE: KOL CONSUEGRA was seen today for weekly treatment management. The chart was checked and the patient's films were reviewed. The patient is doing well. He states that there are no changes currently. No change in shortness of breath, no esophagitis. Continued occasional cough.  PHYSICAL EXAMINATION: weight is 193 lb 14.4 oz (87.952 kg). His oral temperature is 98.1 F (36.7 C). His blood pressure is 155/102 and his pulse is 104. His respiration is 20 and oxygen saturation is 100%.        ASSESSMENT: The patient is doing satisfactorily with treatment.  PLAN: We will continue with the patient's radiation treatment as planned.

## 2012-05-28 ENCOUNTER — Ambulatory Visit: Payer: Medicare Other

## 2012-05-28 ENCOUNTER — Ambulatory Visit: Payer: Medicare Other | Admitting: Physician Assistant

## 2012-05-28 ENCOUNTER — Other Ambulatory Visit (HOSPITAL_BASED_OUTPATIENT_CLINIC_OR_DEPARTMENT_OTHER): Payer: Medicare Other

## 2012-05-28 ENCOUNTER — Ambulatory Visit (HOSPITAL_BASED_OUTPATIENT_CLINIC_OR_DEPARTMENT_OTHER): Payer: Medicare Other | Admitting: Physician Assistant

## 2012-05-28 ENCOUNTER — Ambulatory Visit
Admission: RE | Admit: 2012-05-28 | Discharge: 2012-05-28 | Disposition: A | Discharge status: Home / Self Care | Payer: Medicare Other | Source: Ambulatory Visit | Attending: Radiation Oncology | Admitting: Radiation Oncology

## 2012-05-28 ENCOUNTER — Encounter: Payer: Self-pay | Admitting: Physician Assistant

## 2012-05-28 VITALS — BP 148/85 | HR 111 | Temp 97.5°F | Resp 18 | Ht 68.5 in | Wt 192.0 lb

## 2012-05-28 DIAGNOSIS — C3430 Malignant neoplasm of lower lobe, unspecified bronchus or lung: Secondary | ICD-10-CM

## 2012-05-28 DIAGNOSIS — C3490 Malignant neoplasm of unspecified part of unspecified bronchus or lung: Secondary | ICD-10-CM

## 2012-05-28 LAB — CBC WITH DIFFERENTIAL/PLATELET
Basophils Absolute: 0 10*3/uL (ref 0.0–0.1)
EOS%: 0.3 % (ref 0.0–7.0)
Eosinophils Absolute: 0 10*3/uL (ref 0.0–0.5)
HCT: 38.9 % (ref 38.4–49.9)
HGB: 13.3 g/dL (ref 13.0–17.1)
MONO#: 0.4 10*3/uL (ref 0.1–0.9)
NEUT#: 4.8 10*3/uL (ref 1.5–6.5)
NEUT%: 79.6 % — ABNORMAL HIGH (ref 39.0–75.0)
RDW: 13.9 % (ref 11.0–14.6)
lymph#: 0.7 10*3/uL — ABNORMAL LOW (ref 0.9–3.3)

## 2012-05-28 NOTE — Progress Notes (Signed)
CBC results reported to Dr. Arbutus Ped, per MD--hold treatment today.  Pt notified in lobby and Tiana Loft, PA to see pt today as well.

## 2012-05-28 NOTE — Patient Instructions (Addendum)
Continue with your course of concurrent chemotherapy and radiation therapy Follow up in 3 weeks

## 2012-05-28 NOTE — Progress Notes (Signed)
[facility] Telephone:([phone]) [phone]   Fax:([phone]) [phone]  OFFICE PROGRESS NOTE  [name], MD [address] [place] [address]  DIAGNOSIS: Stage IIIB (T2a., N3, M0) non-small cell lung cancer consistent with invasive adenocarcinoma with negative ALK gene translocation and negative EGFR mutation diagnosed in October of 2013  PRIOR THERAPY: None.  CURRENT THERAPY: Concurrent chemoradiation with weekly carboplatin for AUC of 2 and paclitaxel 45 mg/M2  INTERVAL HISTORY: Logan Russell 77 y.o. male returns to the clinic today for followup visit accompanied his wife. Thus far is tolerating his course of concurrent chemoradiation without difficulty. He voices no specific complaints today.  He denied any problems with nausea, vomiting, diarrhea, constipation, fever or chills.  He denied having any significant chest pain but continues to have shortness breath with exertion. The patient denied having any significant weight loss or night sweats.   MEDICAL HISTORY: Past Medical History  Diagnosis Date  . Hypertension   . Lung cancer 04/12/2012    ALLERGIES:   has no known allergies.  MEDICATIONS:  Current Outpatient Prescriptions  Medication Sig Dispense Refill  . guaiFENesin (MUCINEX) 600 MG 12 hr tablet Take 1,200 mg by mouth 2 (two) times daily.      . promethazine-codeine (PHENERGAN WITH CODEINE) 6.25-10 MG/5ML syrup Take 5 mLs by mouth every 4 (four) hours as needed.  480 mL  5  . triamterene-hydrochlorothiazide (MAXZIDE) 75-50 MG per tablet Take 1 tablet by mouth daily.      . Wound Dressings (RADIAGEL) GEL Apply topically 2 (two) times daily.        REVIEW OF SYSTEMS:  A comprehensive review of systems was negative.   PHYSICAL EXAMINATION: General appearance: alert, cooperative and no distress Head: Normocephalic, without obvious abnormality, atraumatic Neck: no adenopathy Resp: Coarse rhonchi at the right base otherwise good air movement  remaining lung fields Cardio: regular rate and rhythm, S1, S2 normal, no murmur, click, rub or gallop GI: soft, non-tender; bowel sounds normal; no masses,  no organomegaly Extremities: extremities normal, atraumatic, no cyanosis or edema Neurologic: Alert and oriented X 3, normal strength and tone. Normal symmetric reflexes. Normal coordination and gait  ECOG PERFORMANCE STATUS: 1 - Symptomatic but completely ambulatory  Blood pressure 148/85, pulse 111, temperature 97.5 F (36.4 C), temperature source Oral, resp. rate 18, height 5' 8.5" (1.74 m), weight 192 lb (87.091 kg).  LABORATORY DATA: Lab Results  Component Value Date   WBC 6.0 05/28/2012   HGB 13.3 05/28/2012   HCT 38.9 05/28/2012   MCV 89.4 05/28/2012   PLT 81* 05/28/2012      Chemistry      Component Value Date/Time   NA 138 05/21/2012 1009      Component Value Date/Time   CALCIUM 9.3 05/21/2012 1009       RADIOGRAPHIC STUDIES: Dg Chest 1 View  04/04/2012  *RADIOLOGY REPORT*  Clinical Data: Left lower lobe mass biopsy  CHEST - 1 VIEW  Comparison: 04/04/2012 CT images  Findings: Left lower lobe mass again noted.  No significant pneumothorax post biopsy.  The right hilar prominence with postobstructive atelectasis is again noted.  IMPRESSION: No pneumothorax post biopsy.   Original Report Authenticated By: Jearld Lesch, M.D.    Mr Laqueta Jean XL Contrast  04/19/2012  *RADIOLOGY REPORT*  Clinical Data: Lung cancer.  Evaluate for metastatic disease.  MRI HEAD WITHOUT AND WITH CONTRAST  Technique:  Multiplanar, multiecho pulse sequences of the brain and surrounding structures were  obtained according to standard protocol without and with intravenous contrast  Contrast: 9mL MULTIHANCE GADOBENATE DIMEGLUMINE 529 MG/ML IV SOLN  BUN and creatinine were obtained on site at [facility] at [address] Results:  BUN 22.0 mg/dL,  Creatinine 1.6 mg/dL.  Comparison: None.  Findings: The patient had difficulty remaining  motionless for the study.  Images are suboptimal.  Small or subtle lesions could be overlooked.  There is no evidence for acute infarction, intracranial hemorrhage, mass lesion, hydrocephalus, or extra-axial fluid.  Moderate atrophy.  Extensive chronic microvascular ischemic change affects the periventricular greater than subcortical white matter.  No foci of vasogenic edema. Tiny focus of chronic hemorrhage and left cerebellar white matter.  Dolichoectatic cerebral vasculature which is otherwise patent.  Marked bilateral middle terminate hypertrophy versus nasal polyposis.  No frontal or ethmoid sinus air-fluid levels.  Partial empty sella.  Negative cerebellar tonsils and upper cervical region.  Post infusion, there is no definite abnormal enhancement of the brain or meninges.  Negative orbits.  No skull base or osseous lesions.  IMPRESSION: No intracranial metastatic disease is observed.  Atrophy and chronic microvascular ischemic change.  Partial empty sella.  Bilateral middle nasal turbinate hypertrophy versus polyps.  No visible osseous lesions.   Original Report Authenticated By: Davonna Belling, M.D.    Ct Biopsy  04/04/2012  *RADIOLOGY REPORT*  Clinical Data: Left lower lobe 3.4 cm mass, PET positive, concerning for malignancy  CT FLUOROSCOPY GUIDED LEFT LOWER LOBE MASS 18 GAUGE CORE BIOPSIES  Date:  04/04/2012 09:00:00  Radiologist:  M. Ruel Favors, M.D.  Medications:  2 mg Versed, 50 mcg Fentanyl  Guidance:  CT fluoroscopy  Fluoroscopy time:  15 seconds  Sedation time:  15 minutes  Contrast volume:  None.  Complications:  No immediate  PROCEDURE/FINDINGS:  Informed consent was obtained from the patient following explanation of the procedure, risks, benefits and alternatives. The patient understands, agrees and consents for the procedure. All questions were addressed.  A time out was performed.  Maximal barrier sterile technique utilized including caps, mask, sterile gowns, sterile gloves, large sterile  drape, hand hygiene, and betadine  Previous imaging reviewed.  The patient was positioned prone. Initial noncontrast localization CT performed through the chest. Left lower lobe mass-like opacity was localized.  This correlates with the PET scan.  Utilizing CT fluoroscopy, a 17 gauge 6.8 cm access needle was advanced from a posterior lateral approach into the lesion.  Needle position confirmed along the peripheral edge of the mass with CT fluoroscopy.  3  18 gauge core biopsies were obtained of the lesion.  These were placed in formalin.  Needle removed.  Post procedure imaging demonstrates no evidence of hemorrhage or hematoma.  The patient tolerated the procedure well.  IMPRESSION: Successful CT fluoroscopy guided left lower lobe mass 18 gauge core biopsies.   Original Report Authenticated By: Judie Petit. Shick, M.D.     ASSESSMENT/PLAN: This is a very pleasant 77 years old white male with stage IIIB non-small cell lung cancer, adenocarcinoma with negative EGFR mutation and negative ALK gene translocation. He is currently undergoing a course of concurrent chemoradiation. As far is tolerating his course of current chemoradiation without difficulty. Patient was discussed with Dr. Arbutus Ped. His platelet count is suboptimal today at 81,000. His weekly chemotherapy today will be held. Patient will proceed with the remainder of his concurrent chemoradiation as scheduled providing his counts are in an acceptable range. He currently has radiation therapy scheduled through 06/19/2012. We will add a  week of chemotherapy the week of 06/11/2012. He'll followup in 3 weeks with repeat CBC differential and C. met.   Laural Benes, Mandie Crabbe E, PA-C   All questions were answered. The patient knows to call the clinic with any problems, questions or concerns. We can certainly see the patient much sooner if necessary.  I spent 20 minutes counseling the patient face to face. The total time spent in the appointment was 30 minutes.

## 2012-05-29 ENCOUNTER — Ambulatory Visit: Payer: Medicare Other

## 2012-05-29 ENCOUNTER — Ambulatory Visit
Admission: RE | Admit: 2012-05-29 | Discharge: 2012-05-29 | Disposition: A | Discharge status: Home / Self Care | Payer: Medicare Other | Source: Ambulatory Visit | Attending: Radiation Oncology | Admitting: Radiation Oncology

## 2012-05-31 ENCOUNTER — Telehealth: Payer: Self-pay | Admitting: *Deleted

## 2012-05-31 ENCOUNTER — Telehealth: Payer: Self-pay | Admitting: Internal Medicine

## 2012-05-31 ENCOUNTER — Ambulatory Visit: Payer: Medicare Other

## 2012-05-31 ENCOUNTER — Ambulatory Visit
Admission: RE | Admit: 2012-05-31 | Discharge: 2012-05-31 | Disposition: A | Discharge status: Home / Self Care | Payer: Medicare Other | Source: Ambulatory Visit | Attending: Radiation Oncology | Admitting: Radiation Oncology

## 2012-05-31 NOTE — Telephone Encounter (Signed)
appts made and note in for pt to get a new sch while here for rad/onc

## 2012-05-31 NOTE — Telephone Encounter (Signed)
Per staff message and POF I have scheduled appt.  JMW  

## 2012-06-01 ENCOUNTER — Ambulatory Visit
Admission: RE | Admit: 2012-06-01 | Discharge: 2012-06-01 | Disposition: A | Discharge status: Home / Self Care | Payer: Medicare Other | Source: Ambulatory Visit | Attending: Radiation Oncology | Admitting: Radiation Oncology

## 2012-06-01 ENCOUNTER — Encounter: Payer: Self-pay | Admitting: Radiation Oncology

## 2012-06-01 ENCOUNTER — Ambulatory Visit: Payer: Medicare Other

## 2012-06-01 VITALS — BP 147/94 | HR 101 | Temp 97.8°F | Resp 20 | Wt 198.9 lb

## 2012-06-01 DIAGNOSIS — C3490 Malignant neoplasm of unspecified part of unspecified bronchus or lung: Secondary | ICD-10-CM

## 2012-06-01 NOTE — Progress Notes (Signed)
Patient here weekly rad txs left lung 21/33, completed, room air sats=100% , coughing up white pheglm, takes mucinex and cough syrup, , no sore throat, eating well, no difficulty swallowing, coughs more at night 9:45 AM

## 2012-06-01 NOTE — Progress Notes (Signed)
  Radiation Oncology         [phone] ________________________________  Name: Logan Russell MRN: [id number]  Date: 06/01/2012  DOB: [birth year]  Weekly Radiation Therapy Management  Current Dose: 42 Gy     Planned Dose:  66 Gy  Narrative . . . . . . . . The patient presents for routine under treatment assessment.                                                     The patient is without complaint.                                 Set-up films were reviewed.                                 The chart was checked. Physical Findings. . .  weight is 198 lb 14.4 oz (90.22 kg). His oral temperature is 97.8 F (36.6 C). His blood pressure is 147/94 and his pulse is 101. His respiration is 20. . Weight essentially stable.  No significant changes. Impression . . . . . . . The patient is  tolerating radiation. Plan . . . . . . . . . . . . Continue treatment as planned.  ________________________________  Artist Pais. Kathrynn Running, M.D.

## 2012-06-04 ENCOUNTER — Ambulatory Visit
Admission: RE | Admit: 2012-06-04 | Discharge: 2012-06-04 | Disposition: A | Discharge status: Home / Self Care | Payer: Medicare Other | Source: Ambulatory Visit | Attending: Radiation Oncology | Admitting: Radiation Oncology

## 2012-06-04 ENCOUNTER — Ambulatory Visit (HOSPITAL_BASED_OUTPATIENT_CLINIC_OR_DEPARTMENT_OTHER): Payer: Medicare Other

## 2012-06-04 ENCOUNTER — Other Ambulatory Visit (HOSPITAL_BASED_OUTPATIENT_CLINIC_OR_DEPARTMENT_OTHER): Payer: Medicare Other

## 2012-06-04 ENCOUNTER — Ambulatory Visit: Payer: Medicare Other

## 2012-06-04 VITALS — BP 124/67 | HR 54 | Temp 98.6°F

## 2012-06-04 DIAGNOSIS — C3490 Malignant neoplasm of unspecified part of unspecified bronchus or lung: Secondary | ICD-10-CM

## 2012-06-04 DIAGNOSIS — C3430 Malignant neoplasm of lower lobe, unspecified bronchus or lung: Secondary | ICD-10-CM

## 2012-06-04 DIAGNOSIS — Z5111 Encounter for antineoplastic chemotherapy: Secondary | ICD-10-CM

## 2012-06-04 LAB — CBC WITH DIFFERENTIAL/PLATELET
Basophils Absolute: 0 10*3/uL (ref 0.0–0.1)
Eosinophils Absolute: 0 10*3/uL (ref 0.0–0.5)
HCT: 37.9 % — ABNORMAL LOW (ref 38.4–49.9)
HGB: 13 g/dL (ref 13.0–17.1)
LYMPH%: 14.8 % (ref 14.0–49.0)
MONO#: 0.4 10*3/uL (ref 0.1–0.9)
NEUT#: 3.3 10*3/uL (ref 1.5–6.5)
Platelets: 113 10*3/uL — ABNORMAL LOW (ref 140–400)
RBC: 4.19 10*6/uL — ABNORMAL LOW (ref 4.20–5.82)
WBC: 4.4 10*3/uL (ref 4.0–10.3)
nRBC: 0 % (ref 0–0)

## 2012-06-04 LAB — COMPREHENSIVE METABOLIC PANEL (CC13)
ALT: 12 U/L (ref 0–55)
CO2: 26 mEq/L (ref 22–29)
Calcium: 9.8 mg/dL (ref 8.4–10.4)
Chloride: 102 mEq/L (ref 98–107)
Creatinine: 1.5 mg/dL — ABNORMAL HIGH (ref 0.7–1.3)
Glucose: 88 mg/dl (ref 70–99)
Total Protein: 7.5 g/dL (ref 6.4–8.3)

## 2012-06-04 MED ORDER — PACLITAXEL CHEMO INJECTION 300 MG/50ML
45.0000 mg/m2 | Freq: Once | INTRAVENOUS | Status: AC
  Administered 2012-06-04: 96 mg via INTRAVENOUS
  Filled 2012-06-04: qty 16

## 2012-06-04 MED ORDER — FAMOTIDINE IN NACL 20-0.9 MG/50ML-% IV SOLN
20.0000 mg | Freq: Once | INTRAVENOUS | Status: AC
  Administered 2012-06-04: 20 mg via INTRAVENOUS

## 2012-06-04 MED ORDER — ONDANSETRON 16 MG/50ML IVPB (CHCC)
16.0000 mg | Freq: Once | INTRAVENOUS | Status: AC
  Administered 2012-06-04: 16 mg via INTRAVENOUS

## 2012-06-04 MED ORDER — SODIUM CHLORIDE 0.9 % IV SOLN
Freq: Once | INTRAVENOUS | Status: AC
  Administered 2012-06-04: 10:00:00 via INTRAVENOUS

## 2012-06-04 MED ORDER — DIPHENHYDRAMINE HCL 50 MG/ML IJ SOLN
50.0000 mg | Freq: Once | INTRAMUSCULAR | Status: AC
  Administered 2012-06-04: 50 mg via INTRAVENOUS

## 2012-06-04 MED ORDER — DEXAMETHASONE SODIUM PHOSPHATE 10 MG/ML IJ SOLN
20.0000 mg | Freq: Once | INTRAMUSCULAR | Status: AC
  Administered 2012-06-04: 20 mg via INTRAVENOUS

## 2012-06-04 MED ORDER — SODIUM CHLORIDE 0.9 % IV SOLN
144.0000 mg | Freq: Once | INTRAVENOUS | Status: AC
  Administered 2012-06-04: 140 mg via INTRAVENOUS
  Filled 2012-06-04: qty 14

## 2012-06-04 NOTE — Patient Instructions (Addendum)
[facility] Discharge Instructions for Patients Receiving Chemotherapy  Today you received the following chemotherapy agents: taxol, carboplatin  To help prevent nausea and vomiting after your treatment, we encourage you to take your nausea medication.  Take it as often as prescribed.     If you develop nausea and vomiting that is not controlled by your nausea medication, call the clinic. If it is after clinic hours your family physician or the after hours number for the clinic or go to the Emergency Department.   BELOW ARE SYMPTOMS THAT SHOULD BE REPORTED IMMEDIATELY:  *FEVER GREATER THAN 100.5 F  *CHILLS WITH OR WITHOUT FEVER  NAUSEA AND VOMITING THAT IS NOT CONTROLLED WITH YOUR NAUSEA MEDICATION  *UNUSUAL SHORTNESS OF BREATH  *UNUSUAL BRUISING OR BLEEDING  TENDERNESS IN MOUTH AND THROAT WITH OR WITHOUT PRESENCE OF ULCERS  *URINARY PROBLEMS  *BOWEL PROBLEMS  UNUSUAL RASH Items with * indicate a potential emergency and should be followed up as soon as possible.  Feel free to call the clinic you have any questions or concerns. The clinic phone number is [phone].   I have been informed and understand all the instructions given to me. I know to contact the clinic, my physician, or go to the Emergency Department if any problems should occur. I do not have any questions at this time, but understand that I may call the clinic during office hours   should I have any questions or need assistance in obtaining follow up care.    __________________________________________  _____________  __________ Signature of Patient or Authorized Representative            Date                   Time    __________________________________________ Nurse's Signature    

## 2012-06-05 ENCOUNTER — Ambulatory Visit: Payer: Medicare Other

## 2012-06-05 ENCOUNTER — Ambulatory Visit
Admission: RE | Admit: 2012-06-05 | Discharge: 2012-06-05 | Disposition: A | Discharge status: Home / Self Care | Payer: Medicare Other | Source: Ambulatory Visit | Attending: Radiation Oncology | Admitting: Radiation Oncology

## 2012-06-06 ENCOUNTER — Ambulatory Visit
Admission: RE | Admit: 2012-06-06 | Discharge: 2012-06-06 | Disposition: A | Discharge status: Home / Self Care | Payer: Medicare Other | Source: Ambulatory Visit | Attending: Radiation Oncology | Admitting: Radiation Oncology

## 2012-06-07 ENCOUNTER — Ambulatory Visit
Admission: RE | Admit: 2012-06-07 | Discharge: 2012-06-07 | Disposition: A | Discharge status: Home / Self Care | Payer: Medicare Other | Source: Ambulatory Visit | Attending: Radiation Oncology | Admitting: Radiation Oncology

## 2012-06-08 ENCOUNTER — Ambulatory Visit
Admission: RE | Admit: 2012-06-08 | Discharge: 2012-06-08 | Disposition: A | Discharge status: Home / Self Care | Payer: Medicare Other | Source: Ambulatory Visit | Attending: Radiation Oncology | Admitting: Radiation Oncology

## 2012-06-08 ENCOUNTER — Encounter: Payer: Self-pay | Admitting: Radiation Oncology

## 2012-06-08 DIAGNOSIS — C3490 Malignant neoplasm of unspecified part of unspecified bronchus or lung: Secondary | ICD-10-CM

## 2012-06-08 NOTE — Progress Notes (Signed)
pateint here wekly rad txs, 26/33 left lung completed, alert,oriented x3, congestive cough, wheezing heard, stated"mostly coughs up and swallows white pheglm, can't get it to come up",  loss 5 lbs, took ortho vitals, 94% room air, no c/o nausea except Tuesday after eating Lasagna for dinner, none since, says not much appetite, , hasn't eaten breakfast as yet, no c/o of diff swallowing food/fluids, no pain  stated 10:06 AM

## 2012-06-08 NOTE — Progress Notes (Signed)
   Department of Radiation Oncology  Phone:  [phone] Fax:        [phone]  Weekly Treatment Note    Name: Logan Russell Date: 06/08/2012 MRN: [id number] DOB: 03/03/1935   Current dose: 52 Gy  Current fraction: 26   MEDICATIONS: Current Outpatient Prescriptions  Medication Sig Dispense Refill  . guaiFENesin (MUCINEX) 600 MG 12 hr tablet Take 1,200 mg by mouth 2 (two) times daily.      . promethazine-codeine (PHENERGAN WITH CODEINE) 6.25-10 MG/5ML syrup Take 5 mLs by mouth every 4 (four) hours as needed.  480 mL  5  . triamterene-hydrochlorothiazide (MAXZIDE) 75-50 MG per tablet Take 1 tablet by mouth daily.      . Wound Dressings (RADIAGEL) GEL Apply topically 2 (two) times daily.         ALLERGIES: Review of patient's allergies indicates no known allergies.   LABORATORY DATA:  Lab Results  Component Value Date   WBC 4.4 06/04/2012   HGB 13.0 06/04/2012   HCT 37.9* 06/04/2012   MCV 90.5 06/04/2012   PLT 113* 06/04/2012   Lab Results  Component Value Date   NA 140 06/04/2012   K 3.4* 06/04/2012   CL 102 06/04/2012   CO2 26 06/04/2012   Lab Results  Component Value Date   ALT 12 06/04/2012   AST 14 06/04/2012   ALKPHOS 75 06/04/2012   BILITOT 0.59 06/04/2012     NARRATIVE: THESEUS BIRNIE was seen today for weekly treatment management. The chart was checked and the patient's films were reviewed. The patient has been stable. No significant new complaints. He has had an ongoing cough and does have some medication for this. He had one episode of nausea several days ago but none since then.  PHYSICAL EXAMINATION: vitals were not taken for this visit.     alert, in no acute distress, accompanied by his wife today. Weight 193.9 pounds  ASSESSMENT: The patient is doing satisfactorily with treatment.  PLAN: We will continue with the patient's radiation treatment as planned.

## 2012-06-11 ENCOUNTER — Ambulatory Visit (HOSPITAL_BASED_OUTPATIENT_CLINIC_OR_DEPARTMENT_OTHER): Payer: Medicare Other

## 2012-06-11 ENCOUNTER — Other Ambulatory Visit (HOSPITAL_BASED_OUTPATIENT_CLINIC_OR_DEPARTMENT_OTHER): Payer: Medicare Other

## 2012-06-11 ENCOUNTER — Ambulatory Visit
Admission: RE | Admit: 2012-06-11 | Discharge: 2012-06-11 | Disposition: A | Discharge status: Home / Self Care | Payer: Medicare Other | Source: Ambulatory Visit | Attending: Radiation Oncology | Admitting: Radiation Oncology

## 2012-06-11 VITALS — BP 130/74 | HR 67 | Temp 97.9°F

## 2012-06-11 DIAGNOSIS — Z5111 Encounter for antineoplastic chemotherapy: Secondary | ICD-10-CM

## 2012-06-11 DIAGNOSIS — C3430 Malignant neoplasm of lower lobe, unspecified bronchus or lung: Secondary | ICD-10-CM

## 2012-06-11 DIAGNOSIS — C3490 Malignant neoplasm of unspecified part of unspecified bronchus or lung: Secondary | ICD-10-CM

## 2012-06-11 LAB — COMPREHENSIVE METABOLIC PANEL (CC13)
ALT: 12 U/L (ref 0–55)
AST: 12 U/L (ref 5–34)
Albumin: 3 g/dL — ABNORMAL LOW (ref 3.5–5.0)
Calcium: 9 mg/dL (ref 8.4–10.4)
Chloride: 104 mEq/L (ref 98–107)
Potassium: 3.1 mEq/L — ABNORMAL LOW (ref 3.5–5.1)

## 2012-06-11 LAB — CBC WITH DIFFERENTIAL/PLATELET
BASO%: 0.5 % (ref 0.0–2.0)
HCT: 34.9 % — ABNORMAL LOW (ref 38.4–49.9)
MCHC: 34.1 g/dL (ref 32.0–36.0)
MONO#: 0.4 10*3/uL (ref 0.1–0.9)
RBC: 3.88 10*6/uL — ABNORMAL LOW (ref 4.20–5.82)
WBC: 3.8 10*3/uL — ABNORMAL LOW (ref 4.0–10.3)
lymph#: 0.7 10*3/uL — ABNORMAL LOW (ref 0.9–3.3)
nRBC: 0 % (ref 0–0)

## 2012-06-11 MED ORDER — FAMOTIDINE IN NACL 20-0.9 MG/50ML-% IV SOLN
20.0000 mg | Freq: Once | INTRAVENOUS | Status: AC
  Administered 2012-06-11: 20 mg via INTRAVENOUS

## 2012-06-11 MED ORDER — DEXAMETHASONE SODIUM PHOSPHATE 10 MG/ML IJ SOLN
20.0000 mg | Freq: Once | INTRAMUSCULAR | Status: AC
  Administered 2012-06-11: 20 mg via INTRAVENOUS

## 2012-06-11 MED ORDER — DIPHENHYDRAMINE HCL 50 MG/ML IJ SOLN
50.0000 mg | Freq: Once | INTRAMUSCULAR | Status: AC
  Administered 2012-06-11: 50 mg via INTRAVENOUS

## 2012-06-11 MED ORDER — PACLITAXEL CHEMO INJECTION 300 MG/50ML
45.0000 mg/m2 | Freq: Once | INTRAVENOUS | Status: AC
  Administered 2012-06-11: 96 mg via INTRAVENOUS
  Filled 2012-06-11: qty 16

## 2012-06-11 MED ORDER — SODIUM CHLORIDE 0.9 % IV SOLN
Freq: Once | INTRAVENOUS | Status: AC
  Administered 2012-06-11: 11:00:00 via INTRAVENOUS

## 2012-06-11 MED ORDER — ONDANSETRON 16 MG/50ML IVPB (CHCC)
16.0000 mg | Freq: Once | INTRAVENOUS | Status: AC
  Administered 2012-06-11: 16 mg via INTRAVENOUS

## 2012-06-11 MED ORDER — SODIUM CHLORIDE 0.9 % IV SOLN
144.0000 mg | Freq: Once | INTRAVENOUS | Status: AC
  Administered 2012-06-11: 140 mg via INTRAVENOUS
  Filled 2012-06-11: qty 14

## 2012-06-11 NOTE — Patient Instructions (Signed)
[facility] Discharge Instructions for Patients Receiving Chemotherapy  Today you received the following chemotherapy agents Taxol and Carboplatin.  To help prevent nausea and vomiting after your treatment, we encourage you to take your nausea medication as ordered per MD.    If you develop nausea and vomiting that is not controlled by your nausea medication, call the clinic. If it is after clinic hours your family physician or the after hours number for the clinic or go to the Emergency Department.   BELOW ARE SYMPTOMS THAT SHOULD BE REPORTED IMMEDIATELY:  *FEVER GREATER THAN 100.5 F  *CHILLS WITH OR WITHOUT FEVER  NAUSEA AND VOMITING THAT IS NOT CONTROLLED WITH YOUR NAUSEA MEDICATION  *UNUSUAL SHORTNESS OF BREATH  *UNUSUAL BRUISING OR BLEEDING  TENDERNESS IN MOUTH AND THROAT WITH OR WITHOUT PRESENCE OF ULCERS  *URINARY PROBLEMS  *BOWEL PROBLEMS  UNUSUAL RASH Items with * indicate a potential emergency and should be followed up as soon as possible.   Please let the nurse know about any problems that you may have experienced. Feel free to call the clinic you have any questions or concerns. The clinic phone number is [phone].   I have been informed and understand all the instructions given to me. I know to contact the clinic, my physician, or go to the Emergency Department if any problems should occur. I do not have any questions at this time, but understand that I may call the clinic during office hours   should I have any questions or need assistance in obtaining follow up care.    __________________________________________  _____________  __________ Signature of Patient or Authorized Representative            Date                   Time    __________________________________________ Nurse's Signature    

## 2012-06-11 NOTE — Progress Notes (Signed)
Quick Note:  Call patient with the result and order K Dur 20 meg po qd X 7 ______

## 2012-06-12 ENCOUNTER — Ambulatory Visit
Admission: RE | Admit: 2012-06-12 | Discharge: 2012-06-12 | Disposition: A | Discharge status: Home / Self Care | Payer: Medicare Other | Source: Ambulatory Visit | Attending: Radiation Oncology | Admitting: Radiation Oncology

## 2012-06-12 ENCOUNTER — Telehealth: Payer: Self-pay

## 2012-06-12 DIAGNOSIS — C3490 Malignant neoplasm of unspecified part of unspecified bronchus or lung: Secondary | ICD-10-CM

## 2012-06-12 MED ORDER — POTASSIUM CHLORIDE CRYS ER 20 MEQ PO TBCR
20.0000 meq | EXTENDED_RELEASE_TABLET | Freq: Every day | ORAL | Status: DC
Start: 2012-06-12 — End: 2012-07-19

## 2012-06-12 NOTE — Telephone Encounter (Signed)
Message copied by Kallie Locks on Tue Jun 12, 2012  3:44 PM ------      Message from: Caren Griffins      Created: Tue Jun 12, 2012 12:59 PM                   ----- Message -----         From: Si Gaul, MD         Sent: 06/11/2012   3:39 PM           To: Marlan Palau, RN, Charma Igo, RN            Call patient with the result and order K Dur 20 meg po qd X 7

## 2012-06-13 ENCOUNTER — Ambulatory Visit
Admission: RE | Admit: 2012-06-13 | Discharge: 2012-06-13 | Disposition: A | Discharge status: Home / Self Care | Payer: Medicare Other | Source: Ambulatory Visit | Attending: Radiation Oncology | Admitting: Radiation Oncology

## 2012-06-13 ENCOUNTER — Other Ambulatory Visit: Payer: Self-pay | Admitting: *Deleted

## 2012-06-14 ENCOUNTER — Ambulatory Visit
Admission: RE | Admit: 2012-06-14 | Discharge: 2012-06-14 | Disposition: A | Discharge status: Home / Self Care | Payer: Medicare Other | Source: Ambulatory Visit | Attending: Radiation Oncology | Admitting: Radiation Oncology

## 2012-06-15 ENCOUNTER — Encounter: Payer: Self-pay | Admitting: Radiation Oncology

## 2012-06-15 ENCOUNTER — Ambulatory Visit
Admission: RE | Admit: 2012-06-15 | Discharge: 2012-06-15 | Disposition: A | Discharge status: Home / Self Care | Payer: Medicare Other | Source: Ambulatory Visit | Attending: Radiation Oncology | Admitting: Radiation Oncology

## 2012-06-15 VITALS — BP 141/84 | HR 121 | Temp 98.9°F | Resp 24 | Wt 194.8 lb

## 2012-06-15 DIAGNOSIS — C3490 Malignant neoplasm of unspecified part of unspecified bronchus or lung: Secondary | ICD-10-CM

## 2012-06-15 MED ORDER — ALBUTEROL SULFATE HFA 108 (90 BASE) MCG/ACT IN AERS: 2.0000 | INHALATION_SPRAY | Freq: Four times a day (QID) | RESPIRATORY_TRACT | Status: DC | PRN

## 2012-06-15 NOTE — Progress Notes (Signed)
  Radiation Oncology         [phone] ________________________________  Name: Logan Russell MRN: [id number]  Date: 06/15/2012  DOB: 1934/06/18  Weekly Radiation Therapy Management  Current Dose: 62 Gy     Planned Dose:  66 Gy  Narrative . . . . . . . . The patient presents for the final under treatment assessment.                                       Patient here weekly rad txs lrft lung, 31/33 completed, patient alert,orientd x3, coughing up clear sputum, congested/wheezing Rattling sound audible, shortness breath With activity, hasn't had breakfast,had chicken,cabbage cornbread for dinner, taking mucinex and cough syrup,not helping stated pateint, took ortho vitals, sitting t=98.9,b/p=166/104,p=123,rr=24  Standing b/p=141/84, p=121, rtr=24, 100% room air sats, no Dizziness or ligh theadness either stated patient, no nausea new med k-dur, potassium 3.1 on lab 06/11/12, no difficulty swallowing foods though     The patient has had some continuation of previously noted symptoms.                                 Set-up films were reviewed.                                 The chart was checked. Physical Findings. . . Weight essentially stable.  No significant changes. Impression . . . . . . . The patient tolerated radiation relatively well. Plan . . . . . . . . . . . . Complete radiation Tuesday as scheduled, and follow-up in one month. The patient was encouraged to call or return to the clinic in the interim for any worsening symptoms.  ________________________________  Artist Pais Kathrynn Running, M.D.

## 2012-06-15 NOTE — Progress Notes (Signed)
Patient here weekly rad txs lrft lung, 31/33 completed, patient alert,orientd x3, coughing up clear sputum, congested/wheezing  Rattling sound audible, shortness breath  With activity, hasn't had breakfast,had chicken,cabbage cornbread for dinner, taking mucinex and cough syrup,not helping stated pateint, took ortho vitals, sitting t=98.9,b/p=166/104,p=123,rr=24 Standing b/p=141/84, p=121, rtr=24, 100% room air sats, no Dizziness  or ligh theadness either stated patient, no nausea new med k-dur, potassium  3.1 on lab 06/11/12, no difficulty swallowing foods though9:43 AM

## 2012-06-18 ENCOUNTER — Ambulatory Visit
Admission: RE | Admit: 2012-06-18 | Discharge: 2012-06-18 | Disposition: A | Discharge status: Home / Self Care | Payer: Medicare Other | Source: Ambulatory Visit | Attending: Radiation Oncology | Admitting: Radiation Oncology

## 2012-06-18 ENCOUNTER — Telehealth: Payer: Self-pay | Admitting: Internal Medicine

## 2012-06-18 ENCOUNTER — Ambulatory Visit (HOSPITAL_BASED_OUTPATIENT_CLINIC_OR_DEPARTMENT_OTHER): Payer: Medicare Other | Admitting: Physician Assistant

## 2012-06-18 ENCOUNTER — Encounter: Payer: Self-pay | Admitting: Physician Assistant

## 2012-06-18 ENCOUNTER — Other Ambulatory Visit (HOSPITAL_BASED_OUTPATIENT_CLINIC_OR_DEPARTMENT_OTHER): Payer: Medicare Other | Admitting: Lab

## 2012-06-18 VITALS — BP 150/75 | HR 92 | Temp 98.0°F | Resp 20 | Ht 68.5 in | Wt 190.9 lb

## 2012-06-18 DIAGNOSIS — C3430 Malignant neoplasm of lower lobe, unspecified bronchus or lung: Secondary | ICD-10-CM

## 2012-06-18 DIAGNOSIS — C3490 Malignant neoplasm of unspecified part of unspecified bronchus or lung: Secondary | ICD-10-CM

## 2012-06-18 LAB — COMPREHENSIVE METABOLIC PANEL (CC13)
ALT: 18 U/L (ref 0–55)
AST: 16 U/L (ref 5–34)
Alkaline Phosphatase: 69 U/L (ref 40–150)
Calcium: 9.3 mg/dL (ref 8.4–10.4)
Chloride: 105 mEq/L (ref 98–107)
Creatinine: 1.4 mg/dL — ABNORMAL HIGH (ref 0.7–1.3)

## 2012-06-18 LAB — CBC WITH DIFFERENTIAL/PLATELET
BASO%: 1 % (ref 0.0–2.0)
EOS%: 0.4 % (ref 0.0–7.0)
MCH: 32.5 pg (ref 27.2–33.4)
MCHC: 35.1 g/dL (ref 32.0–36.0)
NEUT%: 63.4 % (ref 39.0–75.0)
RDW: 15.6 % — ABNORMAL HIGH (ref 11.0–14.6)
lymph#: 0.5 10*3/uL — ABNORMAL LOW (ref 0.9–3.3)

## 2012-06-18 NOTE — Telephone Encounter (Signed)
appts made and printed for pt pt aware that they will get a call with the scan appt

## 2012-06-18 NOTE — Patient Instructions (Addendum)
Complete her course of concurrent chemoradiation as scheduled Followup with Dr. Arbutus Ped in approximately one month with a restaging CT scan of the chest to reevaluate your disease

## 2012-06-19 ENCOUNTER — Other Ambulatory Visit: Payer: Medicare Other | Admitting: Lab

## 2012-06-19 ENCOUNTER — Ambulatory Visit: Payer: Medicare Other | Admitting: Physician Assistant

## 2012-06-19 ENCOUNTER — Encounter: Payer: Self-pay | Admitting: Radiation Oncology

## 2012-06-19 ENCOUNTER — Ambulatory Visit
Admission: RE | Admit: 2012-06-19 | Discharge: 2012-06-19 | Disposition: A | Discharge status: Home / Self Care | Payer: Medicare Other | Source: Ambulatory Visit | Attending: Radiation Oncology | Admitting: Radiation Oncology

## 2012-06-19 NOTE — Progress Notes (Signed)
[facility] Telephone:(336) [phone]   Fax:(336) [phone]  OFFICE PROGRESS NOTE  [name], MD [address] [place] [address]  DIAGNOSIS: Stage IIIB (T2a., N3, M0) non-small cell lung cancer consistent with invasive adenocarcinoma with negative ALK gene translocation and negative EGFR mutation diagnosed in October of 2013  PRIOR THERAPY: None.  CURRENT THERAPY: Concurrent chemoradiation with weekly carboplatin for AUC of 2 and paclitaxel 45 mg/M2  INTERVAL HISTORY: Logan Russell 78 y.o. male returns to the clinic today for followup visit accompanied his wife. He continues to tolerate his course of concurrent chemoradiation without difficulty. He voices no specific complaints today.  He denied any problems with nausea, vomiting, diarrhea, constipation, fever or chills.  He denied having any significant chest pain but continues to have shortness breath with exertion. The patient denied having any significant weight loss or night sweats.   MEDICAL HISTORY: Past Medical History  Diagnosis Date  . Hypertension   . Lung cancer 04/12/2012    ALLERGIES:   has no known allergies.  MEDICATIONS:  Current Outpatient Prescriptions  Medication Sig Dispense Refill  . albuterol (PROVENTIL HFA;VENTOLIN HFA) 108 (90 BASE) MCG/ACT inhaler Inhale 2 puffs into the lungs every 6 (six) hours as needed for wheezing.  2 Inhaler  2  . guaiFENesin (MUCINEX) 600 MG 12 hr tablet Take 1,200 mg by mouth 2 (two) times daily.      . potassium chloride SA (K-DUR,KLOR-CON) 20 MEQ tablet Take 1 tablet (20 mEq total) by mouth daily.  7 tablet  0  . promethazine-codeine (PHENERGAN WITH CODEINE) 6.25-10 MG/5ML syrup Take 5 mLs by mouth every 4 (four) hours as needed.  480 mL  5  . triamterene-hydrochlorothiazide (MAXZIDE) 75-50 MG per tablet Take 1 tablet by mouth daily.      . Wound Dressings (RADIAGEL) GEL Apply topically 2 (two) times daily.        REVIEW OF SYSTEMS:  A  comprehensive review of systems was negative.   PHYSICAL EXAMINATION: General appearance: alert, cooperative and no distress Head: Normocephalic, without obvious abnormality, atraumatic Neck: no adenopathy Resp: clear to auscultation bilaterally Cardio: regular rate and rhythm, S1, S2 normal, no murmur, click, rub or gallop GI: soft, non-tender; bowel sounds normal; no masses,  no organomegaly Extremities: extremities normal, atraumatic, no cyanosis or edema Neurologic: Alert and oriented X 3, normal strength and tone. Normal symmetric reflexes. Normal coordination and gait  ECOG PERFORMANCE STATUS: 1 - Symptomatic but completely ambulatory  Blood pressure 150/75, pulse 92, temperature 98 F (36.7 C), temperature source Oral, resp. rate 20, height 5' 8.5" (1.74 m), weight 190 lb 14.4 oz (86.592 kg).  LABORATORY DATA: Lab Results  Component Value Date   WBC 2.9* 06/18/2012   HGB 11.8* 06/18/2012   HCT 33.5* 06/18/2012   MCV 92.6 06/18/2012   PLT 231 06/18/2012      Chemistry      Component Value Date/Time   NA 140 06/18/2012 1340      Component Value Date/Time   CALCIUM 9.3 06/18/2012 1340       RADIOGRAPHIC STUDIES: Dg Chest 1 View  04/04/2012  *RADIOLOGY REPORT*  Clinical Data: Left lower lobe mass biopsy  CHEST - 1 VIEW  Comparison: 04/04/2012 CT images  Findings: Left lower lobe mass again noted.  No significant pneumothorax post biopsy.  The right hilar prominence with postobstructive atelectasis is again noted.  IMPRESSION: No pneumothorax post biopsy.   Original Report Authenticated By: Greig Castilla  DelGaizo, M.D.    Mr Laqueta Jean ZO Contrast  04/19/2012  *RADIOLOGY REPORT*  Clinical Data: Lung cancer.  Evaluate for metastatic disease.  MRI HEAD WITHOUT AND WITH CONTRAST  Technique:  Multiplanar, multiecho pulse sequences of the brain and surrounding structures were obtained according to standard protocol without and with intravenous contrast  Contrast: 9mL MULTIHANCE GADOBENATE  DIMEGLUMINE 529 MG/ML IV SOLN  BUN and creatinine were obtained on site at [facility] at [address] Results:  BUN 22.0 mg/dL,  Creatinine 1.6 mg/dL.  Comparison: None.  Findings: The patient had difficulty remaining motionless for the study.  Images are suboptimal.  Small or subtle lesions could be overlooked.  There is no evidence for acute infarction, intracranial hemorrhage, mass lesion, hydrocephalus, or extra-axial fluid.  Moderate atrophy.  Extensive chronic microvascular ischemic change affects the periventricular greater than subcortical white matter.  No foci of vasogenic edema. Tiny focus of chronic hemorrhage and left cerebellar white matter.  Dolichoectatic cerebral vasculature which is otherwise patent.  Marked bilateral middle terminate hypertrophy versus nasal polyposis.  No frontal or ethmoid sinus air-fluid levels.  Partial empty sella.  Negative cerebellar tonsils and upper cervical region.  Post infusion, there is no definite abnormal enhancement of the brain or meninges.  Negative orbits.  No skull base or osseous lesions.  IMPRESSION: No intracranial metastatic disease is observed.  Atrophy and chronic microvascular ischemic change.  Partial empty sella.  Bilateral middle nasal turbinate hypertrophy versus polyps.  No visible osseous lesions.   Original Report Authenticated By: Davonna Belling, M.D.    Ct Biopsy  04/04/2012  *RADIOLOGY REPORT*  Clinical Data: Left lower lobe 3.4 cm mass, PET positive, concerning for malignancy  CT FLUOROSCOPY GUIDED LEFT LOWER LOBE MASS 18 GAUGE CORE BIOPSIES  Date:  04/04/2012 09:00:00  Radiologist:  M. Ruel Favors, M.D.  Medications:  2 mg Versed, 50 mcg Fentanyl  Guidance:  CT fluoroscopy  Fluoroscopy time:  15 seconds  Sedation time:  15 minutes  Contrast volume:  None.  Complications:  No immediate  PROCEDURE/FINDINGS:  Informed consent was obtained from the patient following explanation of the procedure, risks, benefits and alternatives.  The patient understands, agrees and consents for the procedure. All questions were addressed.  A time out was performed.  Maximal barrier sterile technique utilized including caps, mask, sterile gowns, sterile gloves, large sterile drape, hand hygiene, and betadine  Previous imaging reviewed.  The patient was positioned prone. Initial noncontrast localization CT performed through the chest. Left lower lobe mass-like opacity was localized.  This correlates with the PET scan.  Utilizing CT fluoroscopy, a 17 gauge 6.8 cm access needle was advanced from a posterior lateral approach into the lesion.  Needle position confirmed along the peripheral edge of the mass with CT fluoroscopy.  3  18 gauge core biopsies were obtained of the lesion.  These were placed in formalin.  Needle removed.  Post procedure imaging demonstrates no evidence of hemorrhage or hematoma.  The patient tolerated the procedure well.  IMPRESSION: Successful CT fluoroscopy guided left lower lobe mass 18 gauge core biopsies.   Original Report Authenticated By: Judie Petit. Shick, M.D.     ASSESSMENT/PLAN: This is a very pleasant 78 years old white male with stage IIIB non-small cell lung cancer, adenocarcinoma with negative EGFR mutation and negative ALK gene translocation. He is currently undergoing a course of concurrent chemoradiation. Overall he is tolerating his course of current chemoradiation without difficulty. Patient was discussed with Dr. Arbutus Ped. He'll  complete his course of concurrent chemoradiation as scheduled. He'll follow with Dr. Arbutus Ped in approximately one month after completing his course of concurrent chemoradiation with a restaging CT of the chest to reevaluate his disease.   Laural Benes, Khushbu Pippen E, PA-C   All questions were answered. The patient knows to call the clinic with any problems, questions or concerns. We can certainly see the patient much sooner if necessary.  I spent 20 minutes counseling the patient face to face. The total  time spent in the appointment was 30 minutes.

## 2012-06-20 ENCOUNTER — Ambulatory Visit: Payer: Medicare Other | Admitting: Radiation Oncology

## 2012-06-20 NOTE — Progress Notes (Signed)
  Radiation Oncology         [phone]) [phone] ________________________________  Name: Logan Russell MRN: [id number]  Date: 06/19/2012  DOB: 1935-01-23  End of Treatment Note  Diagnosis:   78 yo man with stage IIIB NSCLC of the left lung  Indication for treatment:  Curative Chemoradiotherapy       Radiation treatment dates:  05/02/2012-06/19/2012  Site/dose:   The primary tumor and involved mediastinal lymph nodes were treated to 66 Gy in 33 fractions  Beams/energy:   Do to the diffuse distribution of disease within the patient's chest with bilateral involvement of the mediastinum, a 3-D conformal radiation plan covering the target regions while adequately sparing the spinal cord and uninvolved lungs was not achieved. Accordingly, intensity modulated radiotherapy was required to cover the target volumes with the prescribed radiation. Based on this, the patient was treated on TomoTherapy with a 6 megavolt helical IMRT radiation treatment with daily megavoltage CT image guidance. He was immobilized with a Vac-Lok pillow  Narrative: The patient tolerated radiation treatment relatively well.   For the first 4-5 weeks of radiation, patient was entirely without complaint. However, he did develop some esophageal symptoms including odynophagia. He was able to complete radiation without interruption.  Plan: The patient has completed radiation treatment. The patient will return to radiation oncology clinic for routine followup in one month. I advised them to call or return sooner if they have any questions or concerns related to their recovery or treatment. ________________________________  Artist Pais. Kathrynn Running, M.D.

## 2012-07-05 NOTE — Progress Notes (Signed)
  Radiation Oncology         [phone]) [phone] ________________________________  Name: LAURIN MORGENSTERN MRN: [id number]  Date: 04/30/13  DOB: 1934/11/17  IMRT TREATMENT PLANNING NOTE  NARRATIVE:  The patient was brought to the CT Simulation planning suite a few days ago for radiation planning. At that time, patient was set up for 3-D conformal radiation using a standard 5 field technique. However, due to the broad distribution of disease in the chest including bilateral mediastinal regions, a 3-D plan which respected the dose tolerances the spinal cord as well as the uninvolved lung was not achievable.  accordingly, and I am RT radiation plan was medically necessary and performed using helical 6 megavolt IMRT on the TomoTherapy platform. The CT images were loaded into the planning software.  The target and avoidance structures were contoured.  Treatment planning then occurred. Then, I designed and supervised the construction of an IM RT treatment device characterized by the sinogram.  I have requested : Intensity Modulated Radiotherapy (IMRT) is medically necessary for this case for the following reason:  To maintain the V20 and V5 level of the lungs below published tolerances while respecting the spinal cord dose tolerance.   SPECIAL TREATMENT PROCEDURE:  The planned course of therapy using radiation constitutes a special treatment procedure. Special care is required in the management of this patient for the following reasons.  I have requested : This treatment constitutes a Special Treatment Procedure for the following reason: [ Concurrent chemotherapy requiring careful monitoring for increased toxicities of treatment including weekly laboratory values..  The special nature of the planned course of radiotherapy will require increased physician supervision and oversight to ensure patient's safety with optimal treatment outcomes.  ________________________________  Artist Pais Kathrynn Running, M.D.

## 2012-07-13 ENCOUNTER — Encounter: Payer: Self-pay | Admitting: Radiation Oncology

## 2012-07-19 ENCOUNTER — Encounter: Payer: Self-pay | Admitting: Radiation Oncology

## 2012-07-19 ENCOUNTER — Ambulatory Visit (HOSPITAL_COMMUNITY)
Admission: RE | Admit: 2012-07-19 | Discharge: 2012-07-19 | Disposition: A | Discharge status: Home / Self Care | Payer: Medicare Other | Source: Ambulatory Visit | Attending: Internal Medicine | Admitting: Internal Medicine

## 2012-07-19 ENCOUNTER — Other Ambulatory Visit (HOSPITAL_COMMUNITY): Payer: Medicare Other

## 2012-07-19 ENCOUNTER — Ambulatory Visit
Admission: RE | Admit: 2012-07-19 | Discharge: 2012-07-19 | Disposition: A | Discharge status: Home / Self Care | Payer: Medicare Other | Source: Ambulatory Visit | Attending: Radiation Oncology | Admitting: Radiation Oncology

## 2012-07-19 VITALS — BP 156/97 | HR 120 | Temp 98.2°F | Resp 20 | Wt 188.5 lb

## 2012-07-19 DIAGNOSIS — C3490 Malignant neoplasm of unspecified part of unspecified bronchus or lung: Secondary | ICD-10-CM

## 2012-07-19 DIAGNOSIS — Z923 Personal history of irradiation: Secondary | ICD-10-CM | POA: Insufficient documentation

## 2012-07-19 DIAGNOSIS — R0602 Shortness of breath: Secondary | ICD-10-CM | POA: Insufficient documentation

## 2012-07-19 DIAGNOSIS — R05 Cough: Secondary | ICD-10-CM | POA: Insufficient documentation

## 2012-07-19 DIAGNOSIS — I319 Disease of pericardium, unspecified: Secondary | ICD-10-CM | POA: Insufficient documentation

## 2012-07-19 DIAGNOSIS — R599 Enlarged lymph nodes, unspecified: Secondary | ICD-10-CM | POA: Insufficient documentation

## 2012-07-19 DIAGNOSIS — R634 Abnormal weight loss: Secondary | ICD-10-CM | POA: Insufficient documentation

## 2012-07-19 DIAGNOSIS — R059 Cough, unspecified: Secondary | ICD-10-CM | POA: Insufficient documentation

## 2012-07-19 HISTORY — DX: Personal history of irradiation: Z92.3

## 2012-07-19 NOTE — Progress Notes (Signed)
Patient here for f/u s/p rad tx left lung, 05/02/12-06/19/12, 66Gy/69fxs Alert,oriented x3, no c/o pain, sob, nausea, still occasionally coughs up white pgeglm, sent patient to have Ct chest today instead of tomorrow okay Per MD, and then patient to come back and see MD  With result, patient said he was glad to get this done today and not have to come back tomorrow

## 2012-07-19 NOTE — Progress Notes (Signed)
Radiation Oncology         [phone]) [phone] ________________________________  Name: Logan Russell MRN: [id number]  Date: 07/19/2012  DOB: 1935-04-30  Follow-Up Visit Note  CC: [name], MD  Delight Ovens, MD  Diagnosis:   78 yo man with stage IIIB NSCLC of the left lung s/p Curative Chemoradiotherapy during which his primary tumor and involved mediastinal lymph nodes were treated to 66 Gy in 33 fractions - 05/02/2012-06/19/2012   Interval Since Last Radiation:  1  months  Narrative:  The patient returns today for routine follow-up.  He had CT earlier today which shows some improvement.   And he some decrease in stamina but no frank dyspnea or cough at this point.                           ALLERGIES:  has No Known Allergies.  Meds: Current Outpatient Prescriptions  Medication Sig Dispense Refill  . guaiFENesin (MUCINEX) 600 MG 12 hr tablet Take 1,200 mg by mouth 2 (two) times daily.      Marland Kitchen triamterene-hydrochlorothiazide (MAXZIDE) 75-50 MG per tablet Take 1 tablet by mouth daily.       No current facility-administered medications for this encounter.    Physical Findings: The patient is in no acute distress. Patient is alert and oriented.  weight is 188 lb 8 oz (85.503 kg). His oral temperature is 98.2 F (36.8 C). His blood pressure is 156/97 and his pulse is 120. His respiration is 20 and oxygen saturation is 99%. Marland Kitchen Respiratory effort is unremarkable.  No significant changes.  Lab Findings: Lab Results  Component Value Date   WBC 2.9* 06/18/2012   HGB 11.8* 06/18/2012   HCT 33.5* 06/18/2012   MCV 92.6 06/18/2012   PLT 231 06/18/2012   Radiographic Findings: Ct Chest Wo Contrast  07/19/2012  *RADIOLOGY REPORT*  Clinical Data: Restaging lung cancer.  Radiation and chemotherapy completed.  Cough with shortness of breath and weight loss.  CT CHEST WITHOUT CONTRAST  Technique:  Multidetector CT imaging of the chest was performed following the standard protocol without IV  contrast.  Comparison: Chest CT 03/20/2012.  PET CT 03/28/2012.  Findings: Mediastinal and right supraclavicular lymphadenopathy has not significantly changed.  There is a 12 mm right supraclavicular node on image #9.  A precarinal node measures 2.1 cm short axis on image 28 and a subcarinal node 1.6 cm on image 36.  No progressive adenopathy is identified.  There is a new small pericardial effusion.  There is no significant pleural effusion.  Diffuse emphysematous changes are again noted.  There is increased paramediastinal opacity in both lungs attributed to interval radiation therapy.  The thickening of the walls of the bronchus intermedius and narrowing of the right middle lobe bronchus are unchanged.  Right middle lobe collapse has slightly improved.  The previously demonstrated left lower lobe mass is less well defined and more difficult to measure.  Subjectively, this is smaller, measuring approximately 2.9 x 1.7 cm on image 38 (previously 3.4 x 2.8 cm).  Minimally increased nodularity is noted inferiorly in the left lower lobe on image 49.  Right lower lobe nodules on images 28 and 44 are stable.  The visualized upper abdomen has a stable appearance with scattered low density hepatic and left renal lesions. There are no worrisome osseous findings.  IMPRESSION:  1.  Partial retraction of spiculated left lower lobe mass. 2.  Partial right middle lobe re-expansion. 3.  Radiation changes medially in both lungs. 4.  Little change in mediastinal and right supraclavicular lymphadenopathy.   Original Report Authenticated By: Carey Bullocks, M.D.     Impression:  The patient is recovering from the effects of radiation.  His first post radiation chest CT does show signs of improvement at 4 weeks.  Plan:  The patient will see Dr. Arbutus Ped on Monday to discuss further adjuvant chemotherapy versus surveillance.  _____________________________________  Artist Pais. Kathrynn Running, M.D.

## 2012-07-20 ENCOUNTER — Ambulatory Visit (HOSPITAL_COMMUNITY): Payer: Medicare Other

## 2012-07-23 ENCOUNTER — Other Ambulatory Visit (HOSPITAL_BASED_OUTPATIENT_CLINIC_OR_DEPARTMENT_OTHER): Payer: Medicare Other | Admitting: Lab

## 2012-07-23 ENCOUNTER — Other Ambulatory Visit: Payer: Medicare Other | Admitting: Lab

## 2012-07-23 ENCOUNTER — Telehealth: Payer: Self-pay | Admitting: Internal Medicine

## 2012-07-23 ENCOUNTER — Encounter: Payer: Self-pay | Admitting: Internal Medicine

## 2012-07-23 ENCOUNTER — Ambulatory Visit (HOSPITAL_BASED_OUTPATIENT_CLINIC_OR_DEPARTMENT_OTHER): Payer: Medicare Other | Admitting: Internal Medicine

## 2012-07-23 ENCOUNTER — Ambulatory Visit: Payer: Medicare Other | Admitting: Internal Medicine

## 2012-07-23 DIAGNOSIS — C3430 Malignant neoplasm of lower lobe, unspecified bronchus or lung: Secondary | ICD-10-CM

## 2012-07-23 DIAGNOSIS — C3490 Malignant neoplasm of unspecified part of unspecified bronchus or lung: Secondary | ICD-10-CM

## 2012-07-23 LAB — CBC WITH DIFFERENTIAL/PLATELET
BASO%: 0.4 % (ref 0.0–2.0)
Basophils Absolute: 0 10*3/uL (ref 0.0–0.1)
EOS%: 2 % (ref 0.0–7.0)
HGB: 10.9 g/dL — ABNORMAL LOW (ref 13.0–17.1)
MCH: 33.4 pg (ref 27.2–33.4)
MCHC: 34.7 g/dL (ref 32.0–36.0)
MCV: 96.2 fL (ref 79.3–98.0)
MONO%: 9.3 % (ref 0.0–14.0)
RDW: 18.5 % — ABNORMAL HIGH (ref 11.0–14.6)

## 2012-07-23 LAB — COMPREHENSIVE METABOLIC PANEL (CC13)
AST: 18 U/L (ref 5–34)
Albumin: 2.7 g/dL — ABNORMAL LOW (ref 3.5–5.0)
Alkaline Phosphatase: 78 U/L (ref 40–150)
BUN: 16.9 mg/dL (ref 7.0–26.0)
Creatinine: 1.2 mg/dL (ref 0.7–1.3)
Potassium: 3.9 mEq/L (ref 3.5–5.1)

## 2012-07-23 MED ORDER — FOLIC ACID 1 MG PO TABS: 1.0000 mg | ORAL_TABLET | Freq: Every day | ORAL | Status: DC

## 2012-07-23 MED ORDER — CYANOCOBALAMIN 1000 MCG/ML IJ SOLN
1000.0000 ug | Freq: Once | INTRAMUSCULAR | Status: AC
  Administered 2012-07-23: 1000 ug via INTRAMUSCULAR

## 2012-07-23 MED ORDER — DEXAMETHASONE 4 MG PO TABS: ORAL_TABLET | ORAL | Status: DC

## 2012-07-23 NOTE — Patient Instructions (Signed)
The recent CT scan of the chest showed partial response of your disease. We discussed consolidation chemotherapy with carboplatin and Alimta. First cycle next week. Followup in 4 weeks.

## 2012-07-23 NOTE — Telephone Encounter (Signed)
appts amde and printed..pt aware o mw being emailed and will called w / tx appts.Logan Russell

## 2012-07-23 NOTE — Progress Notes (Signed)
[facility] Telephone:(336) [phone]   Fax:(336) [phone]  OFFICE PROGRESS NOTE  [name], MD [address] [place] [address]  DIAGNOSIS: Stage IIIB (T2a., N3, M0) non-small cell lung cancer consistent with invasive adenocarcinoma with negative ALK gene translocation and negative EGFR mutation diagnosed in October of 2013   PRIOR THERAPY: Concurrent chemoradiation with weekly carboplatin for AUC of 2 and paclitaxel 45 mg/M2, last dose was given 06/11/2012 with partial response.Marland Kitchen   CURRENT THERAPY: The patient will start next week the first cycle of consolidation chemotherapy with carboplatin for AUC of 5 and Alimta 500 mg/M2 every 3 weeks.  INTERVAL HISTORY: CAMARON CAMMACK 78 y.o. male returns to the clinic today for followup visit accompanied his wife. The patient is feeling fine today with no specific complaints except for shortness breath with exertion. He denied having any significant cough, chest pain or hemoptysis. He lost around 10 pounds during his concurrent chemoradiation. He denied having any significant nausea or vomiting. He has no fever or chills today. The patient had repeat CT scan of the chest performed recently and he is here for evaluation and discussion of his scan results.  MEDICAL HISTORY: Past Medical History  Diagnosis Date  . Hypertension   . Lung cancer 04/12/2012  . History of radiation therapy 05/02/12-06/19/12    left lung mediastinal lymph nodes 66Gy/58fx    ALLERGIES:  has No Known Allergies.  MEDICATIONS:  Current Outpatient Prescriptions  Medication Sig Dispense Refill  . guaiFENesin (MUCINEX) 600 MG 12 hr tablet Take 1,200 mg by mouth 2 (two) times daily.      Marland Kitchen triamterene-hydrochlorothiazide (MAXZIDE) 75-50 MG per tablet Take 1 tablet by mouth daily.       No current facility-administered medications for this visit.    REVIEW OF SYSTEMS:  A comprehensive review of systems was negative except for:  Constitutional: positive for weight loss Respiratory: positive for dyspnea on exertion   PHYSICAL EXAMINATION: General appearance: alert, cooperative and no distress Head: Normocephalic, without obvious abnormality, atraumatic Neck: no adenopathy Lymph nodes: Cervical, supraclavicular, and axillary nodes normal. Resp: wheezes bilaterally Cardio: regular rate and rhythm, S1, S2 normal, no murmur, click, rub or gallop GI: soft, non-tender; bowel sounds normal; no masses,  no organomegaly Extremities: extremities normal, atraumatic, no cyanosis or edema Neurologic: Alert and oriented X 3, normal strength and tone. Normal symmetric reflexes. Normal coordination and gait  ECOG PERFORMANCE STATUS: 1 - Symptomatic but completely ambulatory  There were no vitals taken for this visit.  LABORATORY DATA: Lab Results  Component Value Date   WBC 6.8 07/23/2012   HGB 10.9* 07/23/2012   HCT 31.4* 07/23/2012   MCV 96.2 07/23/2012   PLT 242 07/23/2012      Chemistry      Component Value Date/Time   NA 138 07/23/2012 1053   K 3.9 07/23/2012 1053   CL 104 07/23/2012 1053   CO2 26 07/23/2012 1053   BUN 16.9 07/23/2012 1053   CREATININE 1.2 07/23/2012 1053      Component Value Date/Time   CALCIUM 9.3 07/23/2012 1053   ALKPHOS 78 07/23/2012 1053   AST 18 07/23/2012 1053   ALT 20 07/23/2012 1053   BILITOT 0.49 07/23/2012 1053       RADIOGRAPHIC STUDIES: Ct Chest Wo Contrast  07/19/2012  *RADIOLOGY REPORT*  Clinical Data: Restaging lung cancer.  Radiation and chemotherapy completed.  Cough with shortness of breath and weight loss.  CT CHEST WITHOUT CONTRAST  Technique:  Multidetector CT imaging of the chest was performed following the standard protocol without IV contrast.  Comparison: Chest CT 03/20/2012.  PET CT 03/28/2012.  Findings: Mediastinal and right supraclavicular lymphadenopathy has not significantly changed.  There is a 12 mm right supraclavicular node on image #9.  A precarinal node measures  2.1 cm short axis on image 28 and a subcarinal node 1.6 cm on image 36.  No progressive adenopathy is identified.  There is a new small pericardial effusion.  There is no significant pleural effusion.  Diffuse emphysematous changes are again noted.  There is increased paramediastinal opacity in both lungs attributed to interval radiation therapy.  The thickening of the walls of the bronchus intermedius and narrowing of the right middle lobe bronchus are unchanged.  Right middle lobe collapse has slightly improved.  The previously demonstrated left lower lobe mass is less well defined and more difficult to measure.  Subjectively, this is smaller, measuring approximately 2.9 x 1.7 cm on image 38 (previously 3.4 x 2.8 cm).  Minimally increased nodularity is noted inferiorly in the left lower lobe on image 49.  Right lower lobe nodules on images 28 and 44 are stable.  The visualized upper abdomen has a stable appearance with scattered low density hepatic and left renal lesions. There are no worrisome osseous findings.  IMPRESSION:  1.  Partial retraction of spiculated left lower lobe mass. 2.  Partial right middle lobe re-expansion. 3.  Radiation changes medially in both lungs. 4.  Little change in mediastinal and right supraclavicular lymphadenopathy.   Original Report Authenticated By: Carey Bullocks, M.D.     ASSESSMENT: This is a very pleasant 78 years old white male with history of stage IIIB non-small cell lung cancer, adenocarcinoma status post concurrent chemoradiation with weekly carboplatin and paclitaxel with partial response.  PLAN: I discussed the scan results with the patient and his wife today. I recommended for him consideration of consolidation chemotherapy with 3 cycles of systemic chemotherapy in the form of carboplatin for AUC of 5 and Alimta 500 mg/M2 every 3 weeks. I discussed with the patient adverse effect of this treatment including but not limited to alopecia, myelosuppression, nausea and  vomiting, peripheral neuropathy, liver or renal dysfunction. The patient would like to proceed with treatment as planned. He will receive vitamin B12 injection today. I will call his pharmacy with Decadron 4 mg by mouth twice a day the day before, day of and day after the chemotherapy in addition to folic acid 1 mg by mouth daily. The patient is expected to start the first cycle of his systemic chemotherapy with carboplatin and Alimta on 08/01/2012.  He would come back for followup visit in 4 weeks with the start of cycle #2.  He was advised to call immediately she has any concerning symptoms in the interval.   All questions were answered. The patient knows to call the clinic with any problems, questions or concerns. We can certainly see the patient much sooner if necessary.  I spent 15 minutes counseling the patient face to face. The total time spent in the appointment was 25 minutes.

## 2012-08-01 ENCOUNTER — Other Ambulatory Visit: Payer: Self-pay | Admitting: Internal Medicine

## 2012-08-01 ENCOUNTER — Ambulatory Visit (HOSPITAL_BASED_OUTPATIENT_CLINIC_OR_DEPARTMENT_OTHER): Payer: Medicare Other

## 2012-08-01 ENCOUNTER — Other Ambulatory Visit (HOSPITAL_BASED_OUTPATIENT_CLINIC_OR_DEPARTMENT_OTHER): Payer: Medicare Other | Admitting: Lab

## 2012-08-01 DIAGNOSIS — Z5111 Encounter for antineoplastic chemotherapy: Secondary | ICD-10-CM

## 2012-08-01 DIAGNOSIS — C3410 Malignant neoplasm of upper lobe, unspecified bronchus or lung: Secondary | ICD-10-CM

## 2012-08-01 DIAGNOSIS — C3430 Malignant neoplasm of lower lobe, unspecified bronchus or lung: Secondary | ICD-10-CM

## 2012-08-01 LAB — CBC WITH DIFFERENTIAL/PLATELET
BASO%: 0.1 % (ref 0.0–2.0)
Basophils Absolute: 0 10*3/uL (ref 0.0–0.1)
HCT: 33 % — ABNORMAL LOW (ref 38.4–49.9)
HGB: 11.1 g/dL — ABNORMAL LOW (ref 13.0–17.1)
LYMPH%: 3.5 % — ABNORMAL LOW (ref 14.0–49.0)
MCH: 32.4 pg (ref 27.2–33.4)
MCV: 96.2 fL (ref 79.3–98.0)
nRBC: 0 % (ref 0–0)

## 2012-08-01 LAB — COMPREHENSIVE METABOLIC PANEL (CC13)
ALT: 16 U/L (ref 0–55)
AST: 13 U/L (ref 5–34)
Albumin: 2.9 g/dL — ABNORMAL LOW (ref 3.5–5.0)
CO2: 22 mEq/L (ref 22–29)
Calcium: 9.5 mg/dL (ref 8.4–10.4)
Chloride: 105 mEq/L (ref 98–107)
Creatinine: 1 mg/dL (ref 0.7–1.3)
Potassium: 3.4 mEq/L — ABNORMAL LOW (ref 3.5–5.1)
Sodium: 137 mEq/L (ref 136–145)
Total Protein: 7.1 g/dL (ref 6.4–8.3)

## 2012-08-01 MED ORDER — SODIUM CHLORIDE 0.9 % IV SOLN
500.0000 mg/m2 | Freq: Once | INTRAVENOUS | Status: AC
  Administered 2012-08-01: 1000 mg via INTRAVENOUS
  Filled 2012-08-01: qty 40

## 2012-08-01 MED ORDER — DEXAMETHASONE SODIUM PHOSPHATE 4 MG/ML IJ SOLN
20.0000 mg | Freq: Once | INTRAMUSCULAR | Status: AC
  Administered 2012-08-01: 20 mg via INTRAVENOUS

## 2012-08-01 MED ORDER — ONDANSETRON 16 MG/50ML IVPB (CHCC)
16.0000 mg | Freq: Once | INTRAVENOUS | Status: AC
  Administered 2012-08-01: 16 mg via INTRAVENOUS

## 2012-08-01 MED ORDER — SODIUM CHLORIDE 0.9 % IV SOLN
Freq: Once | INTRAVENOUS | Status: AC
  Administered 2012-08-01: 11:00:00 via INTRAVENOUS

## 2012-08-01 MED ORDER — CARBOPLATIN CHEMO INJECTION 600 MG/60ML
440.0000 mg | Freq: Once | INTRAVENOUS | Status: AC
  Administered 2012-08-01: 440 mg via INTRAVENOUS
  Filled 2012-08-01: qty 44

## 2012-08-01 NOTE — Patient Instructions (Addendum)
[facility] Discharge Instructions for Patients Receiving Chemotherapy  Today you received the following chemotherapy agents alimta/ carboplatin  To help prevent nausea and vomiting after your treatment, we encourage you to take your nausea medication  and take it as often as prescribed   If you develop nausea and vomiting that is not controlled by your nausea medication, call the clinic. If it is after clinic hours your family physician or the after hours number for the clinic or go to the Emergency Department.   BELOW ARE SYMPTOMS THAT SHOULD BE REPORTED IMMEDIATELY:  *FEVER GREATER THAN 100.5 F  *CHILLS WITH OR WITHOUT FEVER  NAUSEA AND VOMITING THAT IS NOT CONTROLLED WITH YOUR NAUSEA MEDICATION  *UNUSUAL SHORTNESS OF BREATH  *UNUSUAL BRUISING OR BLEEDING  TENDERNESS IN MOUTH AND THROAT WITH OR WITHOUT PRESENCE OF ULCERS  *URINARY PROBLEMS  *BOWEL PROBLEMS  UNUSUAL RASH Items with * indicate a potential emergency and should be followed up as soon as possible.  One of the nurses will contact you 24 hours after your treatment. Please let the nurse know about any problems that you may have experienced. Feel free to call the clinic you have any questions or concerns. The clinic phone number is [phone].   I have been informed and understand all the instructions given to me. I know to contact the clinic, my physician, or go to the Emergency Department if any problems should occur. I do not have any questions at this time, but understand that I may call the clinic during office hours   should I have any questions or need assistance in obtaining follow up care.    __________________________________________  _____________  __________ Signature of Patient or Authorized Representative            Date                   Time    __________________________________________ Nurse's Signature

## 2012-08-08 ENCOUNTER — Other Ambulatory Visit (HOSPITAL_BASED_OUTPATIENT_CLINIC_OR_DEPARTMENT_OTHER): Payer: Medicare Other

## 2012-08-08 DIAGNOSIS — C3430 Malignant neoplasm of lower lobe, unspecified bronchus or lung: Secondary | ICD-10-CM

## 2012-08-08 LAB — COMPREHENSIVE METABOLIC PANEL (CC13)
ALT: 18 U/L (ref 0–55)
AST: 13 U/L (ref 5–34)
Chloride: 102 mEq/L (ref 98–107)
Creatinine: 1 mg/dL (ref 0.7–1.3)
Sodium: 137 mEq/L (ref 136–145)
Total Bilirubin: 0.67 mg/dL (ref 0.20–1.20)
Total Protein: 6.2 g/dL — ABNORMAL LOW (ref 6.4–8.3)

## 2012-08-08 LAB — CBC WITH DIFFERENTIAL/PLATELET
BASO%: 0.1 % (ref 0.0–2.0)
EOS%: 1.8 % (ref 0.0–7.0)
HCT: 29.3 % — ABNORMAL LOW (ref 38.4–49.9)
MCH: 33.7 pg — ABNORMAL HIGH (ref 27.2–33.4)
MCHC: 34.6 g/dL (ref 32.0–36.0)
MONO#: 0.1 10*3/uL (ref 0.1–0.9)
NEUT%: 76.9 % — ABNORMAL HIGH (ref 39.0–75.0)
RBC: 3.01 10*6/uL — ABNORMAL LOW (ref 4.20–5.82)
WBC: 2.5 10*3/uL — ABNORMAL LOW (ref 4.0–10.3)
lymph#: 0.5 10*3/uL — ABNORMAL LOW (ref 0.9–3.3)

## 2012-08-15 ENCOUNTER — Other Ambulatory Visit (HOSPITAL_BASED_OUTPATIENT_CLINIC_OR_DEPARTMENT_OTHER): Payer: Medicare Other

## 2012-08-15 DIAGNOSIS — C3490 Malignant neoplasm of unspecified part of unspecified bronchus or lung: Secondary | ICD-10-CM

## 2012-08-15 LAB — COMPREHENSIVE METABOLIC PANEL (CC13)
ALT: 17 U/L (ref 0–55)
AST: 13 U/L (ref 5–34)
Alkaline Phosphatase: 71 U/L (ref 40–150)
Creatinine: 1 mg/dL (ref 0.7–1.3)
Total Bilirubin: 0.4 mg/dL (ref 0.20–1.20)

## 2012-08-15 LAB — CBC WITH DIFFERENTIAL/PLATELET
BASO%: 0.3 % (ref 0.0–2.0)
EOS%: 4.2 % (ref 0.0–7.0)
HCT: 27.7 % — ABNORMAL LOW (ref 38.4–49.9)
LYMPH%: 21.2 % (ref 14.0–49.0)
MCH: 33.8 pg — ABNORMAL HIGH (ref 27.2–33.4)
MCHC: 34.4 g/dL (ref 32.0–36.0)
MONO%: 9.9 % (ref 0.0–14.0)
NEUT%: 64.4 % (ref 39.0–75.0)
Platelets: 43 10*3/uL — ABNORMAL LOW (ref 140–400)
RBC: 2.82 10*6/uL — ABNORMAL LOW (ref 4.20–5.82)

## 2012-08-16 ENCOUNTER — Telehealth: Payer: Self-pay | Admitting: *Deleted

## 2012-08-16 NOTE — Telephone Encounter (Signed)
PRESCRIPTION READ DEXAMETHASONE 4MG  TAKE ONE TAB TWICE A DAY THE DAY BEFORE, THE DAY OF AND THE DAY AFTER CHEMO EVERY THREE WEEKS. PT.'S BOTTLE WAS LABELED DEXAMETHASONE 4MG  TAKE 4 TABLETS BY MOUTH TWICE DAILY ON DAY BEFORE, DAY OF AND DAY AFTER CHEMOTHERAPY EVERY 3 WEEKS. PHARMACIST CONTACTED PT. CONCERNING INCORRECT LABELING AND WILL REFILL PRESCRIPTION WITH THE CORRECT DIRECTIONS. NOTIFIED ADRENA JOHNSON,PA.

## 2012-08-22 ENCOUNTER — Ambulatory Visit (HOSPITAL_BASED_OUTPATIENT_CLINIC_OR_DEPARTMENT_OTHER): Payer: Medicare Other | Admitting: Physician Assistant

## 2012-08-22 ENCOUNTER — Encounter: Payer: Self-pay | Admitting: Internal Medicine

## 2012-08-22 ENCOUNTER — Other Ambulatory Visit (HOSPITAL_BASED_OUTPATIENT_CLINIC_OR_DEPARTMENT_OTHER): Payer: Medicare Other | Admitting: Lab

## 2012-08-22 ENCOUNTER — Ambulatory Visit (HOSPITAL_BASED_OUTPATIENT_CLINIC_OR_DEPARTMENT_OTHER): Payer: Medicare Other

## 2012-08-22 ENCOUNTER — Encounter: Payer: Self-pay | Admitting: Physician Assistant

## 2012-08-22 ENCOUNTER — Telehealth: Payer: Self-pay | Admitting: Internal Medicine

## 2012-08-22 DIAGNOSIS — C3430 Malignant neoplasm of lower lobe, unspecified bronchus or lung: Secondary | ICD-10-CM

## 2012-08-22 DIAGNOSIS — Z5111 Encounter for antineoplastic chemotherapy: Secondary | ICD-10-CM

## 2012-08-22 LAB — CBC WITH DIFFERENTIAL/PLATELET
BASO%: 0 % (ref 0.0–2.0)
HCT: 29.6 % — ABNORMAL LOW (ref 38.4–49.9)
LYMPH%: 10.9 % — ABNORMAL LOW (ref 14.0–49.0)
MCH: 32.2 pg (ref 27.2–33.4)
MCHC: 33.1 g/dL (ref 32.0–36.0)
MCV: 97.4 fL (ref 79.3–98.0)
MONO#: 1.1 10*3/uL — ABNORMAL HIGH (ref 0.1–0.9)
NEUT%: 66.1 % (ref 39.0–75.0)
Platelets: 302 10*3/uL (ref 140–400)
WBC: 4.9 10*3/uL (ref 4.0–10.3)

## 2012-08-22 LAB — COMPREHENSIVE METABOLIC PANEL (CC13)
AST: 17 U/L (ref 5–34)
BUN: 16.6 mg/dL (ref 7.0–26.0)
Calcium: 9.6 mg/dL (ref 8.4–10.4)
Chloride: 107 mEq/L (ref 98–107)
Creatinine: 1 mg/dL (ref 0.7–1.3)

## 2012-08-22 MED ORDER — DEXAMETHASONE SODIUM PHOSPHATE 4 MG/ML IJ SOLN
20.0000 mg | Freq: Once | INTRAMUSCULAR | Status: AC
  Administered 2012-08-22: 20 mg via INTRAVENOUS

## 2012-08-22 MED ORDER — SODIUM CHLORIDE 0.9 % IV SOLN
Freq: Once | INTRAVENOUS | Status: AC
  Administered 2012-08-22: 11:00:00 via INTRAVENOUS

## 2012-08-22 MED ORDER — SODIUM CHLORIDE 0.9 % IV SOLN
500.0000 mg/m2 | Freq: Once | INTRAVENOUS | Status: AC
  Administered 2012-08-22: 1000 mg via INTRAVENOUS
  Filled 2012-08-22: qty 40

## 2012-08-22 MED ORDER — SODIUM CHLORIDE 0.9 % IV SOLN
440.0000 mg | Freq: Once | INTRAVENOUS | Status: AC
  Administered 2012-08-22: 440 mg via INTRAVENOUS
  Filled 2012-08-22: qty 44

## 2012-08-22 MED ORDER — ONDANSETRON 16 MG/50ML IVPB (CHCC)
16.0000 mg | Freq: Once | INTRAVENOUS | Status: AC
  Administered 2012-08-22: 16 mg via INTRAVENOUS

## 2012-08-22 NOTE — Patient Instructions (Addendum)
[facility] Discharge Instructions for Patients Receiving Chemotherapy  Today you received the following chemotherapy agents: Carboplatin, Alimta.  To help prevent nausea and vomiting after your treatment, we encourage you to take your nausea medication.  If you develop nausea and vomiting that is not controlled by your nausea medication, call the clinic.    BELOW ARE SYMPTOMS THAT SHOULD BE REPORTED IMMEDIATELY:  *FEVER GREATER THAN 100.5 F  *CHILLS WITH OR WITHOUT FEVER  NAUSEA AND VOMITING THAT IS NOT CONTROLLED WITH YOUR NAUSEA MEDICATION  *UNUSUAL SHORTNESS OF BREATH  *UNUSUAL BRUISING OR BLEEDING  TENDERNESS IN MOUTH AND THROAT WITH OR WITHOUT PRESENCE OF ULCERS  *URINARY PROBLEMS  *BOWEL PROBLEMS  UNUSUAL RASH Items with * indicate a potential emergency and should be followed up as soon as possible.

## 2012-08-22 NOTE — Patient Instructions (Addendum)
Continue weekly labs as scheduled Followup in 3 weeks prior to your next scheduled cycle of chemotherapy 

## 2012-08-26 NOTE — Progress Notes (Signed)
[facility] [phone]   Fax:([phone]  OFFICE PROGRESS NOTE  [name], MD [address] [place] [address]  DIAGNOSIS: Stage IIIB (T2a., N3, M0) non-small cell lung cancer consistent with invasive adenocarcinoma with negative ALK gene translocation and negative EGFR mutation diagnosed in October of 2013   PRIOR THERAPY: Concurrent chemoradiation with weekly carboplatin for AUC of 2 and paclitaxel 45 mg/M2, last dose was given 06/11/2012 with partial response.Marland Kitchen   CURRENT THERAPY:  Consolidation chemotherapy with carboplatin for AUC of 5 and Alimta 500 mg/M2 every 3 weeks. Status post 1 cycle.  INTERVAL HISTORY: Logan Russell 78 y.o. male returns to the clinic today for followup visit accompanied his wife. The patient is feeling fine today with no specific complaints except for shortness breath with exertion. She reports that his cough is better. He tolerated his first cycle of consolidation chemotherapy with carboplatin and Alimta without significant difficulty. He denied having any chest pain or hemoptysis.  He denied having any significant nausea or vomiting. He denied fever, chills, diarrhea or constipation.   MEDICAL HISTORY: Past Medical History  Diagnosis Date  . Hypertension   . Lung cancer 04/12/2012  . History of radiation therapy 05/02/12-06/19/12    left lung mediastinal lymph nodes 66Gy/45fx    ALLERGIES:  has No Known Allergies.  MEDICATIONS:  Current Outpatient Prescriptions  Medication Sig Dispense Refill  . dexamethasone (DECADRON) 4 MG tablet 4 mg by mouth twice a day the day before, day of and day after the chemotherapy every 3 weeks.  30 tablet  0  . folic acid (FOLVITE) 1 MG tablet Take 1 tablet (1 mg total) by mouth daily.  30 tablet  2  . guaiFENesin (MUCINEX) 600 MG 12 hr tablet Take 1,200 mg by mouth 2 (two) times daily.      Marland Kitchen triamterene-hydrochlorothiazide (MAXZIDE) 75-50 MG per tablet Take 1  tablet by mouth daily.       No current facility-administered medications for this visit.    REVIEW OF SYSTEMS:  A comprehensive review of systems was negative except for: Respiratory: positive for cough and dyspnea on exertion   PHYSICAL EXAMINATION: General appearance: alert, cooperative and no distress Head: Normocephalic, without obvious abnormality, atraumatic Neck: no adenopathy Lymph nodes: Cervical, supraclavicular, and axillary nodes normal. Resp: wheezes bilaterally Cardio: regular rate and rhythm, S1, S2 normal, no murmur, click, rub or gallop GI: soft, non-tender; bowel sounds normal; no masses,  no organomegaly Extremities: extremities normal, atraumatic, no cyanosis or edema Neurologic: Alert and oriented X 3, normal strength and tone. Normal symmetric reflexes. Normal coordination and gait  ECOG PERFORMANCE STATUS: 1 - Symptomatic but completely ambulatory  Blood pressure 168/86, pulse 108, temperature 97.5 F (36.4 C), temperature source Oral, resp. rate 20, height 5' 8.5" (1.74 m), weight 186 lb 12.8 oz (84.732 kg).  LABORATORY DATA: Lab Results  Component Value Date   WBC 4.9 08/22/2012   HGB 9.8* 08/22/2012   HCT 29.6* 08/22/2012   MCV 97.4 08/22/2012   PLT 302 08/22/2012      Chemistry      Component Value Date/Time   NA 140 08/22/2012 0925   K 3.9 08/22/2012 0925   CL 107 08/22/2012 0925   CO2 23 08/22/2012 0925   BUN 16.6 08/22/2012 0925   CREATININE 1.0 08/22/2012 0925      Component Value Date/Time   CALCIUM 9.6 08/22/2012 0925   ALKPHOS 75 08/22/2012 0925   AST 17  08/22/2012 0925   ALT 14 08/22/2012 0925   BILITOT 0.24 08/22/2012 0925       RADIOGRAPHIC STUDIES: Ct Chest Wo Contrast  07/19/2012  *RADIOLOGY REPORT*  Clinical Data: Restaging lung cancer.  Radiation and chemotherapy completed.  Cough with shortness of breath and weight loss.  CT CHEST WITHOUT CONTRAST  Technique:  Multidetector CT imaging of the chest was performed following the standard  protocol without IV contrast.  Comparison: Chest CT 03/20/2012.  PET CT 03/28/2012.  Findings: Mediastinal and right supraclavicular lymphadenopathy has not significantly changed.  There is a 12 mm right supraclavicular node on image #9.  A precarinal node measures 2.1 cm short axis on image 28 and a subcarinal node 1.6 cm on image 36.  No progressive adenopathy is identified.  There is a new small pericardial effusion.  There is no significant pleural effusion.  Diffuse emphysematous changes are again noted.  There is increased paramediastinal opacity in both lungs attributed to interval radiation therapy.  The thickening of the walls of the bronchus intermedius and narrowing of the right middle lobe bronchus are unchanged.  Right middle lobe collapse has slightly improved.  The previously demonstrated left lower lobe mass is less well defined and more difficult to measure.  Subjectively, this is smaller, measuring approximately 2.9 x 1.7 cm on image 38 (previously 3.4 x 2.8 cm).  Minimally increased nodularity is noted inferiorly in the left lower lobe on image 49.  Right lower lobe nodules on images 28 and 44 are stable.  The visualized upper abdomen has a stable appearance with scattered low density hepatic and left renal lesions. There are no worrisome osseous findings.  IMPRESSION:  1.  Partial retraction of spiculated left lower lobe mass. 2.  Partial right middle lobe re-expansion. 3.  Radiation changes medially in both lungs. 4.  Little change in mediastinal and right supraclavicular lymphadenopathy.   Original Report Authenticated By: Carey Bullocks, M.D.     ASSESSMENT/PLAN: This is a very pleasant 78 years old white male with history of stage IIIB non-small cell lung cancer, adenocarcinoma status post concurrent chemoradiation with weekly carboplatin and paclitaxel with partial response. He is now receiving consolidation chemotherapy in the form of carboplatin for an AUC of 5 and Alimta 500 mg per  meter squared given every 3 weeks, status post 1 cycle. Patient was discussed with Dr. Arbutus Ped. He will continue with weekly labs consisting of a CBC differential and C. met. He'll return in 3 weeks prior to his next schedule cycle of consolidation chemotherapy.  Nissim Fleischer E, PA-C  He was advised to call immediately she has any concerning symptoms in the interval.   All questions were answered. The patient knows to call the clinic with any problems, questions or concerns. We can certainly see the patient much sooner if necessary.  I spent 20 minutes counseling the patient face to face. The total time spent in the appointment was 30 minutes.

## 2012-08-29 ENCOUNTER — Other Ambulatory Visit (HOSPITAL_BASED_OUTPATIENT_CLINIC_OR_DEPARTMENT_OTHER): Payer: Medicare Other

## 2012-08-29 DIAGNOSIS — C3430 Malignant neoplasm of lower lobe, unspecified bronchus or lung: Secondary | ICD-10-CM

## 2012-08-29 LAB — CBC WITH DIFFERENTIAL/PLATELET
Basophils Absolute: 0 10*3/uL (ref 0.0–0.1)
Eosinophils Absolute: 0 10*3/uL (ref 0.0–0.5)
HCT: 28.3 % — ABNORMAL LOW (ref 38.4–49.9)
HGB: 9.6 g/dL — ABNORMAL LOW (ref 13.0–17.1)
MONO#: 0.1 10*3/uL (ref 0.1–0.9)
NEUT%: 73.2 % (ref 39.0–75.0)
WBC: 2.4 10*3/uL — ABNORMAL LOW (ref 4.0–10.3)
lymph#: 0.5 10*3/uL — ABNORMAL LOW (ref 0.9–3.3)

## 2012-08-29 LAB — COMPREHENSIVE METABOLIC PANEL (CC13)
BUN: 29.8 mg/dL — ABNORMAL HIGH (ref 7.0–26.0)
CO2: 25 mEq/L (ref 22–29)
Calcium: 8.5 mg/dL (ref 8.4–10.4)
Chloride: 106 mEq/L (ref 98–107)
Creatinine: 1 mg/dL (ref 0.7–1.3)

## 2012-09-05 ENCOUNTER — Other Ambulatory Visit (HOSPITAL_BASED_OUTPATIENT_CLINIC_OR_DEPARTMENT_OTHER): Payer: Medicare Other

## 2012-09-05 DIAGNOSIS — C3430 Malignant neoplasm of lower lobe, unspecified bronchus or lung: Secondary | ICD-10-CM

## 2012-09-05 LAB — CBC WITH DIFFERENTIAL/PLATELET
BASO%: 0 % (ref 0.0–2.0)
Basophils Absolute: 0 10*3/uL (ref 0.0–0.1)
HCT: 26.6 % — ABNORMAL LOW (ref 38.4–49.9)
HGB: 9.1 g/dL — ABNORMAL LOW (ref 13.0–17.1)
MCHC: 34.2 g/dL (ref 32.0–36.0)
MONO#: 0.4 10*3/uL (ref 0.1–0.9)
NEUT%: 67.5 % (ref 39.0–75.0)
WBC: 3.1 10*3/uL — ABNORMAL LOW (ref 4.0–10.3)
lymph#: 0.6 10*3/uL — ABNORMAL LOW (ref 0.9–3.3)

## 2012-09-05 LAB — COMPREHENSIVE METABOLIC PANEL (CC13)
ALT: 15 U/L (ref 0–55)
Albumin: 2.8 g/dL — ABNORMAL LOW (ref 3.5–5.0)
CO2: 25 mEq/L (ref 22–29)
Calcium: 9.2 mg/dL (ref 8.4–10.4)
Chloride: 102 mEq/L (ref 98–107)
Creatinine: 1.2 mg/dL (ref 0.7–1.3)
Potassium: 3.6 mEq/L (ref 3.5–5.1)

## 2012-09-12 ENCOUNTER — Telehealth: Payer: Self-pay | Admitting: Internal Medicine

## 2012-09-12 ENCOUNTER — Encounter: Payer: Self-pay | Admitting: Physician Assistant

## 2012-09-12 ENCOUNTER — Ambulatory Visit (HOSPITAL_BASED_OUTPATIENT_CLINIC_OR_DEPARTMENT_OTHER): Payer: Medicare Other

## 2012-09-12 ENCOUNTER — Encounter: Payer: Self-pay | Admitting: Internal Medicine

## 2012-09-12 ENCOUNTER — Other Ambulatory Visit (HOSPITAL_BASED_OUTPATIENT_CLINIC_OR_DEPARTMENT_OTHER): Payer: Medicare Other | Admitting: Lab

## 2012-09-12 ENCOUNTER — Ambulatory Visit (HOSPITAL_BASED_OUTPATIENT_CLINIC_OR_DEPARTMENT_OTHER): Payer: Medicare Other | Admitting: Physician Assistant

## 2012-09-12 DIAGNOSIS — C3430 Malignant neoplasm of lower lobe, unspecified bronchus or lung: Secondary | ICD-10-CM

## 2012-09-12 DIAGNOSIS — Z5111 Encounter for antineoplastic chemotherapy: Secondary | ICD-10-CM

## 2012-09-12 DIAGNOSIS — R0602 Shortness of breath: Secondary | ICD-10-CM

## 2012-09-12 LAB — CBC WITH DIFFERENTIAL/PLATELET
BASO%: 0 % (ref 0.0–2.0)
EOS%: 0 % (ref 0.0–7.0)
HCT: 28.8 % — ABNORMAL LOW (ref 38.4–49.9)
MCHC: 33 g/dL (ref 32.0–36.0)
MONO#: 0.5 10*3/uL (ref 0.1–0.9)
NEUT%: 84.8 % — ABNORMAL HIGH (ref 39.0–75.0)
RBC: 3.03 10*6/uL — ABNORMAL LOW (ref 4.20–5.82)
RDW: 16.2 % — ABNORMAL HIGH (ref 11.0–14.6)
WBC: 5.5 10*3/uL (ref 4.0–10.3)
lymph#: 0.4 10*3/uL — ABNORMAL LOW (ref 0.9–3.3)

## 2012-09-12 LAB — COMPREHENSIVE METABOLIC PANEL (CC13)
ALT: 15 U/L (ref 0–55)
AST: 16 U/L (ref 5–34)
CO2: 22 mEq/L (ref 22–29)
Calcium: 9.7 mg/dL (ref 8.4–10.4)
Chloride: 102 mEq/L (ref 98–107)
Potassium: 4 mEq/L (ref 3.5–5.1)
Sodium: 137 mEq/L (ref 136–145)
Total Protein: 7.5 g/dL (ref 6.4–8.3)

## 2012-09-12 MED ORDER — SODIUM CHLORIDE 0.9 % IV SOLN
500.0000 mg/m2 | Freq: Once | INTRAVENOUS | Status: AC
  Administered 2012-09-12: 1000 mg via INTRAVENOUS
  Filled 2012-09-12: qty 40

## 2012-09-12 MED ORDER — SODIUM CHLORIDE 0.9 % IV SOLN
Freq: Once | INTRAVENOUS | Status: AC
  Administered 2012-09-12: 13:00:00 via INTRAVENOUS

## 2012-09-12 MED ORDER — DEXAMETHASONE SODIUM PHOSPHATE 4 MG/ML IJ SOLN
20.0000 mg | Freq: Once | INTRAMUSCULAR | Status: AC
  Administered 2012-09-12: 20 mg via INTRAVENOUS

## 2012-09-12 MED ORDER — ONDANSETRON 16 MG/50ML IVPB (CHCC)
16.0000 mg | Freq: Once | INTRAVENOUS | Status: AC
  Administered 2012-09-12: 16 mg via INTRAVENOUS

## 2012-09-12 MED ORDER — SODIUM CHLORIDE 0.9 % IV SOLN
440.0000 mg | Freq: Once | INTRAVENOUS | Status: AC
  Administered 2012-09-12: 440 mg via INTRAVENOUS
  Filled 2012-09-12: qty 44

## 2012-09-12 MED ORDER — CYANOCOBALAMIN 1000 MCG/ML IJ SOLN
1000.0000 ug | Freq: Once | INTRAMUSCULAR | Status: AC
  Administered 2012-09-12: 1000 ug via INTRAMUSCULAR

## 2012-09-12 NOTE — Patient Instructions (Addendum)
Continue weekly labs as scheduled Followup with Dr. Arbutus Ped in one month with a restaging CT scan of your chest to reevaluate your disease

## 2012-09-12 NOTE — Patient Instructions (Signed)
[facility] Discharge Instructions for Patients Receiving Chemotherapy  Today you received the following chemotherapy agents :  Alimta,  Carboplatin.  To help prevent nausea and vomiting after your treatment, we encourage you to take your nausea medication as instructed by your physician.    If you develop nausea and vomiting that is not controlled by your nausea medication, call the clinic. If it is after clinic hours your family physician or the after hours number for the clinic or go to the Emergency Department.   BELOW ARE SYMPTOMS THAT SHOULD BE REPORTED IMMEDIATELY:  *FEVER GREATER THAN 100.5 F  *CHILLS WITH OR WITHOUT FEVER  NAUSEA AND VOMITING THAT IS NOT CONTROLLED WITH YOUR NAUSEA MEDICATION  *UNUSUAL SHORTNESS OF BREATH  *UNUSUAL BRUISING OR BLEEDING  TENDERNESS IN MOUTH AND THROAT WITH OR WITHOUT PRESENCE OF ULCERS  *URINARY PROBLEMS  *BOWEL PROBLEMS  UNUSUAL RASH Items with * indicate a potential emergency and should be followed up as soon as possible.  One of the nurses will contact you 24 hours after your treatment. Please let the nurse know about any problems that you may have experienced. Feel free to call the clinic you have any questions or concerns. The clinic phone number is [phone].   I have been informed and understand all the instructions given to me. I know to contact the clinic, my physician, or go to the Emergency Department if any problems should occur. I do not have any questions at this time, but understand that I may call the clinic during office hours   should I have any questions or need assistance in obtaining follow up care.    __________________________________________  _____________  __________ Signature of Patient or Authorized Representative            Date                   Time    __________________________________________ Nurse's Signature

## 2012-09-12 NOTE — Telephone Encounter (Signed)
gv and printed appt schedule and avs for pt...pt aware cs will contact to schedult ct.

## 2012-09-17 NOTE — Progress Notes (Signed)
[facility] [phone]   Fax:([phone]) [phone]  OFFICE PROGRESS NOTE  [name], MD [address] [place] [address]  DIAGNOSIS: Stage IIIB (T2a., N3, M0) non-small cell lung cancer consistent with invasive adenocarcinoma with negative ALK gene translocation and negative EGFR mutation diagnosed in October of 2013   PRIOR THERAPY: Concurrent chemoradiation with weekly carboplatin for AUC of 2 and paclitaxel 45 mg/M2, last dose was given 06/11/2012 with partial response.Marland Kitchen   CURRENT THERAPY:  Consolidation chemotherapy with carboplatin for AUC of 5 and Alimta 500 mg/M2 every 3 weeks. Status post 2 cycles.  INTERVAL HISTORY: Logan Russell 78 y.o. male returns to the clinic today for followup visit accompanied his wife. The patient is feeling fine today with no specific complaints except for shortness breath with exertion. He reports that his cough has improved some more. He is tolerating his consolidation chemotherapy with carboplatin and Alimta without significant difficulty. He denied having any chest pain or hemoptysis.  He denied having any significant nausea or vomiting. He denied fever, chills, diarrhea or constipation.   MEDICAL HISTORY: Past Medical History  Diagnosis Date  . Hypertension   . Lung cancer 04/12/2012  . History of radiation therapy 05/02/12-06/19/12    left lung mediastinal lymph nodes 66Gy/91fx    ALLERGIES:  has No Known Allergies.  MEDICATIONS:  Current Outpatient Prescriptions  Medication Sig Dispense Refill  . dexamethasone (DECADRON) 4 MG tablet 4 mg by mouth twice a day the day before, day of and day after the chemotherapy every 3 weeks.  30 tablet  0  . folic acid (FOLVITE) 1 MG tablet Take 1 tablet (1 mg total) by mouth daily.  30 tablet  2  . guaiFENesin (MUCINEX) 600 MG 12 hr tablet Take 1,200 mg by mouth 2 (two) times daily.      Marland Kitchen triamterene-hydrochlorothiazide (MAXZIDE) 75-50 MG per tablet Take 1  tablet by mouth daily.       No current facility-administered medications for this visit.    REVIEW OF SYSTEMS:  A comprehensive review of systems was negative except for: Respiratory: positive for cough and dyspnea on exertion   PHYSICAL EXAMINATION: General appearance: alert, cooperative and no distress Head: Normocephalic, without obvious abnormality, atraumatic Neck: no adenopathy Lymph nodes: Cervical, supraclavicular, and axillary nodes normal. Resp: wheezes bilaterally Cardio: regular rate and rhythm, S1, S2 normal, no murmur, click, rub or gallop GI: soft, non-tender; bowel sounds normal; no masses,  no organomegaly Extremities: extremities normal, atraumatic, no cyanosis or edema Neurologic: Alert and oriented X 3, normal strength and tone. Normal symmetric reflexes. Normal coordination and gait  ECOG PERFORMANCE STATUS: 1 - Symptomatic but completely ambulatory  Blood pressure 147/88, pulse 105, temperature 96.8 F (36 C), temperature source Oral, resp. rate 20, height 5' 8.5" (1.74 m), weight 181 lb 14.4 oz (82.509 kg).  LABORATORY DATA: Lab Results  Component Value Date   WBC 5.5 09/12/2012   HGB 9.5* 09/12/2012   HCT 28.8* 09/12/2012   MCV 95.0 09/12/2012   PLT 233 09/12/2012      Chemistry      Component Value Date/Time   NA 137 09/12/2012 1025   K 4.0 09/12/2012 1025   CL 102 09/12/2012 1025   CO2 22 09/12/2012 1025   BUN 21.3 09/12/2012 1025   CREATININE 1.2 09/12/2012 1025      Component Value Date/Time   CALCIUM 9.7 09/12/2012 1025   ALKPHOS 77 09/12/2012 1025   AST 16  09/12/2012 1025   ALT 15 09/12/2012 1025   BILITOT 0.33 09/12/2012 1025       RADIOGRAPHIC STUDIES: Ct Chest Wo Contrast  07/19/2012  *RADIOLOGY REPORT*  Clinical Data: Restaging lung cancer.  Radiation and chemotherapy completed.  Cough with shortness of breath and weight loss.  CT CHEST WITHOUT CONTRAST  Technique:  Multidetector CT imaging of the chest was performed following the standard  protocol without IV contrast.  Comparison: Chest CT 03/20/2012.  PET CT 03/28/2012.  Findings: Mediastinal and right supraclavicular lymphadenopathy has not significantly changed.  There is a 12 mm right supraclavicular node on image #9.  A precarinal node measures 2.1 cm short axis on image 28 and a subcarinal node 1.6 cm on image 36.  No progressive adenopathy is identified.  There is a new small pericardial effusion.  There is no significant pleural effusion.  Diffuse emphysematous changes are again noted.  There is increased paramediastinal opacity in both lungs attributed to interval radiation therapy.  The thickening of the walls of the bronchus intermedius and narrowing of the right middle lobe bronchus are unchanged.  Right middle lobe collapse has slightly improved.  The previously demonstrated left lower lobe mass is less well defined and more difficult to measure.  Subjectively, this is smaller, measuring approximately 2.9 x 1.7 cm on image 38 (previously 3.4 x 2.8 cm).  Minimally increased nodularity is noted inferiorly in the left lower lobe on image 49.  Right lower lobe nodules on images 28 and 44 are stable.  The visualized upper abdomen has a stable appearance with scattered low density hepatic and left renal lesions. There are no worrisome osseous findings.  IMPRESSION:  1.  Partial retraction of spiculated left lower lobe mass. 2.  Partial right middle lobe re-expansion. 3.  Radiation changes medially in both lungs. 4.  Little change in mediastinal and right supraclavicular lymphadenopathy.   Original Report Authenticated By: Carey Bullocks, M.D.     ASSESSMENT/PLAN: This is a very pleasant 78 years old white male with history of stage IIIB non-small cell lung cancer, adenocarcinoma status post concurrent chemoradiation with weekly carboplatin and paclitaxel with partial response. He is now receiving consolidation chemotherapy in the form of carboplatin for an AUC of 5 and Alimta 500 mg per  meter squared given every 3 weeks, status post 2 cycles. Patient was discussed with Dr. Arbutus Ped. He'll receive cycle #3 of his consolidation chemotherapy with carboplatin and Alimta. He will continue with weekly labs consisting of a CBC differential and C. met. He'll followup with Dr. Arbutus Ped in one month with a CBC differential, C. met and a restaging CT scan of the chest with contrast to reevaluate his disease.   Yafet Cline E, PA-C  He was advised to call immediately he has any concerning symptoms in the interval.   All questions were answered. The patient knows to call the clinic with any problems, questions or concerns. We can certainly see the patient much sooner if necessary.  I spent 20 minutes counseling the patient face to face. The total time spent in the appointment was 30 minutes.

## 2012-09-19 ENCOUNTER — Other Ambulatory Visit (HOSPITAL_BASED_OUTPATIENT_CLINIC_OR_DEPARTMENT_OTHER): Payer: Medicare Other

## 2012-09-19 DIAGNOSIS — C3490 Malignant neoplasm of unspecified part of unspecified bronchus or lung: Secondary | ICD-10-CM

## 2012-09-19 LAB — COMPREHENSIVE METABOLIC PANEL (CC13)
AST: 16 U/L (ref 5–34)
Alkaline Phosphatase: 72 U/L (ref 40–150)
BUN: 26.1 mg/dL — ABNORMAL HIGH (ref 7.0–26.0)
Creatinine: 1.1 mg/dL (ref 0.7–1.3)
Total Bilirubin: 0.6 mg/dL (ref 0.20–1.20)

## 2012-09-19 LAB — CBC WITH DIFFERENTIAL/PLATELET
Basophils Absolute: 0 10*3/uL (ref 0.0–0.1)
EOS%: 0 % (ref 0.0–7.0)
HCT: 28.5 % — ABNORMAL LOW (ref 38.4–49.9)
HGB: 9.6 g/dL — ABNORMAL LOW (ref 13.0–17.1)
MCH: 32.1 pg (ref 27.2–33.4)
MCV: 95.3 fL (ref 79.3–98.0)
MONO%: 5.7 % (ref 0.0–14.0)
NEUT%: 73.9 % (ref 39.0–75.0)

## 2012-09-19 NOTE — Progress Notes (Signed)
Quick Note:  Call patient with the result and order K Dur 20 meq po qd X 7 days ______ 

## 2012-09-20 ENCOUNTER — Telehealth: Payer: Self-pay | Admitting: *Deleted

## 2012-09-20 DIAGNOSIS — E876 Hypokalemia: Secondary | ICD-10-CM

## 2012-09-20 MED ORDER — POTASSIUM CHLORIDE CRYS ER 20 MEQ PO TBCR: 20.0000 meq | EXTENDED_RELEASE_TABLET | Freq: Every day | ORAL | Status: DC

## 2012-09-20 NOTE — Telephone Encounter (Signed)
Message copied by Caren Griffins on Thu Sep 20, 2012 11:38 AM ------      Message from: Si Gaul      Created: Wed Sep 19, 2012  7:10 PM       Call patient with the result and order K Dur 20 meq po qd X 7 days ------

## 2012-09-20 NOTE — Telephone Encounter (Signed)
Pt verbalized understanding regarding 20meq x 7 days 

## 2012-09-26 ENCOUNTER — Other Ambulatory Visit (HOSPITAL_BASED_OUTPATIENT_CLINIC_OR_DEPARTMENT_OTHER): Payer: Medicare Other

## 2012-09-26 DIAGNOSIS — C3430 Malignant neoplasm of lower lobe, unspecified bronchus or lung: Secondary | ICD-10-CM

## 2012-09-26 LAB — COMPREHENSIVE METABOLIC PANEL (CC13)
ALT: 11 U/L (ref 0–55)
AST: 13 U/L (ref 5–34)
Alkaline Phosphatase: 79 U/L (ref 40–150)
BUN: 19.8 mg/dL (ref 7.0–26.0)
Creatinine: 1.2 mg/dL (ref 0.7–1.3)
Total Bilirubin: 0.5 mg/dL (ref 0.20–1.20)

## 2012-09-26 LAB — CBC WITH DIFFERENTIAL/PLATELET
Basophils Absolute: 0 10*3/uL (ref 0.0–0.1)
EOS%: 0.2 % (ref 0.0–7.0)
HCT: 22.9 % — ABNORMAL LOW (ref 38.4–49.9)
HGB: 7.7 g/dL — ABNORMAL LOW (ref 13.0–17.1)
MCH: 31.3 pg (ref 27.2–33.4)
MCV: 93.1 fL (ref 79.3–98.0)
NEUT%: 74.5 % (ref 39.0–75.0)
lymph#: 0.7 10*3/uL — ABNORMAL LOW (ref 0.9–3.3)

## 2012-09-27 ENCOUNTER — Telehealth: Payer: Self-pay | Admitting: Medical Oncology

## 2012-09-27 NOTE — Telephone Encounter (Signed)
Done earlier date.

## 2012-09-27 NOTE — Telephone Encounter (Signed)
Message copied by Charma Igo on Thu Sep 27, 2012  2:02 PM ------      Message from: Si Gaul      Created: Wed Sep 19, 2012  7:10 PM       Call patient with the result and order K Dur 20 meq po qd X 7 days ------

## 2012-10-03 ENCOUNTER — Ambulatory Visit (HOSPITAL_COMMUNITY)
Admission: RE | Admit: 2012-10-03 | Discharge: 2012-10-03 | Disposition: A | Discharge status: Home / Self Care | Payer: Medicare Other | Source: Ambulatory Visit | Attending: Physician Assistant | Admitting: Physician Assistant

## 2012-10-03 ENCOUNTER — Other Ambulatory Visit (HOSPITAL_BASED_OUTPATIENT_CLINIC_OR_DEPARTMENT_OTHER): Payer: Medicare Other

## 2012-10-03 DIAGNOSIS — K5710 Diverticulosis of small intestine without perforation or abscess without bleeding: Secondary | ICD-10-CM | POA: Insufficient documentation

## 2012-10-03 DIAGNOSIS — J90 Pleural effusion, not elsewhere classified: Secondary | ICD-10-CM | POA: Insufficient documentation

## 2012-10-03 DIAGNOSIS — C3490 Malignant neoplasm of unspecified part of unspecified bronchus or lung: Secondary | ICD-10-CM | POA: Insufficient documentation

## 2012-10-03 DIAGNOSIS — K7689 Other specified diseases of liver: Secondary | ICD-10-CM | POA: Insufficient documentation

## 2012-10-03 DIAGNOSIS — R0602 Shortness of breath: Secondary | ICD-10-CM | POA: Insufficient documentation

## 2012-10-03 DIAGNOSIS — Z9221 Personal history of antineoplastic chemotherapy: Secondary | ICD-10-CM | POA: Insufficient documentation

## 2012-10-03 DIAGNOSIS — J438 Other emphysema: Secondary | ICD-10-CM | POA: Insufficient documentation

## 2012-10-03 DIAGNOSIS — N289 Disorder of kidney and ureter, unspecified: Secondary | ICD-10-CM | POA: Insufficient documentation

## 2012-10-03 DIAGNOSIS — R599 Enlarged lymph nodes, unspecified: Secondary | ICD-10-CM | POA: Insufficient documentation

## 2012-10-03 DIAGNOSIS — R911 Solitary pulmonary nodule: Secondary | ICD-10-CM | POA: Insufficient documentation

## 2012-10-03 DIAGNOSIS — I2510 Atherosclerotic heart disease of native coronary artery without angina pectoris: Secondary | ICD-10-CM | POA: Insufficient documentation

## 2012-10-03 DIAGNOSIS — I7090 Unspecified atherosclerosis: Secondary | ICD-10-CM | POA: Insufficient documentation

## 2012-10-03 DIAGNOSIS — J9819 Other pulmonary collapse: Secondary | ICD-10-CM | POA: Insufficient documentation

## 2012-10-03 DIAGNOSIS — Z923 Personal history of irradiation: Secondary | ICD-10-CM | POA: Insufficient documentation

## 2012-10-03 LAB — CBC WITH DIFFERENTIAL/PLATELET
Basophils Absolute: 0 10*3/uL (ref 0.0–0.1)
EOS%: 1 % (ref 0.0–7.0)
HCT: 23.3 % — ABNORMAL LOW (ref 38.4–49.9)
HGB: 8.1 g/dL — ABNORMAL LOW (ref 13.0–17.1)
LYMPH%: 20.1 % (ref 14.0–49.0)
MCH: 33.8 pg — ABNORMAL HIGH (ref 27.2–33.4)
MCHC: 34.6 g/dL (ref 32.0–36.0)
MCV: 97.7 fL (ref 79.3–98.0)
MONO%: 20.3 % — ABNORMAL HIGH (ref 0.0–14.0)
NEUT%: 58.2 % (ref 39.0–75.0)
Platelets: 154 10*3/uL (ref 140–400)
lymph#: 0.6 10*3/uL — ABNORMAL LOW (ref 0.9–3.3)

## 2012-10-03 LAB — COMPREHENSIVE METABOLIC PANEL (CC13)
AST: 17 U/L (ref 5–34)
BUN: 13.3 mg/dL (ref 7.0–26.0)
Calcium: 9.3 mg/dL (ref 8.4–10.4)
Chloride: 102 mEq/L (ref 98–107)
Creatinine: 1.2 mg/dL (ref 0.7–1.3)
Total Bilirubin: 0.42 mg/dL (ref 0.20–1.20)

## 2012-10-03 MED ORDER — IOHEXOL 300 MG/ML  SOLN
80.0000 mL | Freq: Once | INTRAMUSCULAR | Status: AC | PRN
  Administered 2012-10-03: 80 mL via INTRAVENOUS

## 2012-10-08 ENCOUNTER — Encounter: Payer: Self-pay | Admitting: Internal Medicine

## 2012-10-08 ENCOUNTER — Telehealth: Payer: Self-pay | Admitting: Internal Medicine

## 2012-10-08 ENCOUNTER — Other Ambulatory Visit (HOSPITAL_BASED_OUTPATIENT_CLINIC_OR_DEPARTMENT_OTHER): Payer: Medicare Other

## 2012-10-08 ENCOUNTER — Ambulatory Visit (HOSPITAL_BASED_OUTPATIENT_CLINIC_OR_DEPARTMENT_OTHER): Payer: Medicare Other | Admitting: Internal Medicine

## 2012-10-08 DIAGNOSIS — C3430 Malignant neoplasm of lower lobe, unspecified bronchus or lung: Secondary | ICD-10-CM

## 2012-10-08 DIAGNOSIS — R0602 Shortness of breath: Secondary | ICD-10-CM

## 2012-10-08 LAB — CBC WITH DIFFERENTIAL/PLATELET
Basophils Absolute: 0 10*3/uL (ref 0.0–0.1)
EOS%: 0.6 % (ref 0.0–7.0)
HCT: 26.4 % — ABNORMAL LOW (ref 38.4–49.9)
HGB: 8.9 g/dL — ABNORMAL LOW (ref 13.0–17.1)
MCH: 33.4 pg (ref 27.2–33.4)
MONO#: 0.8 10*3/uL (ref 0.1–0.9)
NEUT%: 71.9 % (ref 39.0–75.0)
Platelets: 211 10*3/uL (ref 140–400)
lymph#: 0.6 10*3/uL — ABNORMAL LOW (ref 0.9–3.3)

## 2012-10-08 LAB — COMPREHENSIVE METABOLIC PANEL (CC13)
BUN: 17 mg/dL (ref 7.0–26.0)
CO2: 25 mEq/L (ref 22–29)
Calcium: 9.1 mg/dL (ref 8.4–10.4)
Chloride: 106 mEq/L (ref 98–107)
Creatinine: 1.2 mg/dL (ref 0.7–1.3)

## 2012-10-08 NOTE — Telephone Encounter (Signed)
gv pt appt schedule August. Pt aware central will call w/ct appt.

## 2012-10-08 NOTE — Progress Notes (Signed)
[facility] Telephone:([phone]   Fax:([phone]  OFFICE PROGRESS NOTE  [name], MD [address] [place] [address]  DIAGNOSIS: Stage IIIB (T2a., N3, M0) non-small cell lung cancer consistent with invasive adenocarcinoma with negative ALK gene translocation and negative EGFR mutation diagnosed in October of 2013   PRIOR THERAPY:  1) Concurrent chemoradiation with weekly carboplatin for AUC of 2 and paclitaxel 45 mg/M2, last dose was given 06/11/2012 with partial response. 2)  Consolidation chemotherapy with carboplatin for AUC of 5 and Alimta 500 mg/M2 every 3 weeks. Status post 3 cycles last dose was given 09/12/2012 with stable disease.  CURRENT THERAPY: Observation  INTERVAL HISTORY: AYAZ SONDGEROTH 78 y.o. male returns to the clinic today for followup visit accompanied by his wife. The patient tolerated the last 3 cycles of consolidation chemotherapy with carboplatin and Alimta fairly well with no significant adverse effects. He denied having any significant nausea or vomiting, no fever or chills. He denied having any significant chest pain but continues to have shortness breath with exertion, no cough or hemoptysis. The patient has repeat CT scan of the chest performed recently and he is here for evaluation and discussion of his scan results.  MEDICAL HISTORY: Past Medical History  Diagnosis Date  . Hypertension   . Lung cancer 04/12/2012  . History of radiation therapy 05/02/12-06/19/12    left lung mediastinal lymph nodes 66Gy/42fx    ALLERGIES:  has No Known Allergies.  MEDICATIONS:  Current Outpatient Prescriptions  Medication Sig Dispense Refill  . folic acid (FOLVITE) 1 MG tablet Take 1 tablet (1 mg total) by mouth daily.  30 tablet  2  . guaiFENesin (MUCINEX) 600 MG 12 hr tablet Take 1,200 mg by mouth 2 (two) times daily.      Marland Kitchen triamterene-hydrochlorothiazide (MAXZIDE) 75-50 MG per tablet Take 1 tablet by mouth daily.       Marland Kitchen dexamethasone (DECADRON) 4 MG tablet 4 mg by mouth twice a day the day before, day of and day after the chemotherapy every 3 weeks.  30 tablet  0   No current facility-administered medications for this visit.    REVIEW OF SYSTEMS:  A comprehensive review of systems was negative except for: Respiratory: positive for dyspnea on exertion   PHYSICAL EXAMINATION: General appearance: alert, cooperative and no distress Head: Normocephalic, without obvious abnormality, atraumatic Neck: no adenopathy Lymph nodes: Cervical, supraclavicular, and axillary nodes normal. Resp: clear to auscultation bilaterally Cardio: regular rate and rhythm, S1, S2 normal, no murmur, click, rub or gallop GI: soft, non-tender; bowel sounds normal; no masses,  no organomegaly Extremities: extremities normal, atraumatic, no cyanosis or edema Neurologic: Alert and oriented X 3, normal strength and tone. Normal symmetric reflexes. Normal coordination and gait  ECOG PERFORMANCE STATUS: 1 - Symptomatic but completely ambulatory  Blood pressure 153/77, pulse 102, temperature 97 F (36.1 C), temperature source Oral, resp. rate 18, height 5' 8.5" (1.74 m), weight 185 lb (83.915 kg).  LABORATORY DATA: Lab Results  Component Value Date   WBC 5.1 10/08/2012   HGB 8.9* 10/08/2012   HCT 26.4* 10/08/2012   MCV 98.9* 10/08/2012   PLT 211 10/08/2012      Chemistry      Component Value Date/Time   NA 142 10/08/2012 1028   K 3.7 10/08/2012 1028   CL 106 10/08/2012 1028   CO2 25 10/08/2012 1028   BUN 17.0 10/08/2012 1028   CREATININE 1.2 10/08/2012 1028  Component Value Date/Time   CALCIUM 9.1 10/08/2012 1028   ALKPHOS 81 10/08/2012 1028   AST 19 10/08/2012 1028   ALT 14 10/08/2012 1028   BILITOT 0.35 10/08/2012 1028       RADIOGRAPHIC STUDIES: Ct Chest W Contrast  10/03/2012  *RADIOLOGY REPORT*  Clinical Data: Small cell lung cancer.  Chemotherapy and radiation therapy complete.  Shortness of breath.  Restaging scan.   CT CHEST WITH CONTRAST  Technique:  Multidetector CT imaging of the chest was performed following the standard protocol during bolus administration of intravenous contrast.  Contrast: 80mL OMNIPAQUE IOHEXOL 300 MG/ML  SOLN  Comparison: CT of the thorax 07/19/2012.  Findings:  Mediastinum: Heart size is normal. There is no significant pericardial fluid, thickening or pericardial calcification. There is atherosclerosis of the thoracic aorta, the great vessels of the mediastinum and the coronary arteries, including calcified atherosclerotic plaque in the left main, left anterior descending, left circumflex and right coronary arteries. There are numerous enlarged mediastinal and right hilar lymph nodes.  Specific examples include a 1.6 cm subcarinal lymph node, 2.1 x 2.7 cm low right paratracheal lymph node, 1.2 cm high right paratracheal lymph node, and a 2.6 x 2.4 cm right hilar lymph node.  Direct comparison with the prior noncontrast CT examination is challenging, however, this burden of lymphadenopathy appears roughly similar to the prior study.  The esophagus is unremarkable in appearance.  Lungs/Pleura: Previously noted left lower lobe lesion appears less bulky and measures approximately 19 x 25 mm on today's examination (image 35 of series 2), with some retraction of the overlying pleura and a small amount of adjacent pleural fluid.  Extensive architectural distortion around this region, as well as in the perihilar aspects of the lungs bilaterally is again noted, most compatible with evolving postradiation changes.  There is again a large amount of amorphous soft tissue in the right perihilar region, predominantly involving the central aspect of the right middle lobe.  Chronic atelectasis of much of the right middle lobe is unchanged.  Worsening patchy opacities and architectural distortion in the medial aspect of the right lower lobe are likely part of the evolving postradiation changes.  Much of the ground-  glass attenuation seen on the prior examination has resolved, likely reflecting resolving pneumonitis. Mild centrilobular emphysema.  Upper Abdomen: Multiple subcentimeter low attenuation lesions in the liver appears similar to the prior examination, and although these are too small to definitively characterize, these are favored to represent small cysts.  2.0 cm low attenuation lesion in the upper pole of the left kidney is incompletely visualized, but appears similar to prior studies and is favored to represent a small simple cyst.  Local normal elbow normal with normal duodenal diverticulum incidentally noted.  Musculoskeletal: There are no aggressive appearing lytic or blastic lesions noted in the visualized portions of the skeleton.  IMPRESSION: 1.  Evolving postradiation changes in the lungs bilaterally, as above with slight decrease in size of the left lower lobe pulmonary nodule.  Prominent right perihilar soft tissue most pronounced in the central aspect of the right middle lobe, right hilar lymphadenopathy and mediastinal adenopathy are all very similar to the prior examination, suggesting some residual disease. Correlation with repeat PET-CT may be warranted to evaluate for persistent areas of hypermetabolism in these regions. 2. Atherosclerosis, including left main and three-vessel coronary artery disease. 3.  Additional incidental findings, as above.   Original Report Authenticated By: Trudie Reed, M.D.     ASSESSMENT: This is a very  pleasant 78 years old white male with history of stage IIIB non-small cell lung cancer status post concurrent chemoradiation followed by consolidation chemotherapy with stable disease.   PLAN: I discussed the scan results with the patient and his wife. I recommended for him to continue on observation for now with repeat CT scan of the chest in 3 months. He was advised to call me immediately if he has any concerning symptoms in the interval.  All questions were  answered. The patient knows to call the clinic with any problems, questions or concerns. We can certainly see the patient much sooner if necessary.  I spent 15 minutes counseling the patient face to face. The total time spent in the appointment was 25 minutes.

## 2012-10-08 NOTE — Patient Instructions (Addendum)
No evidence for disease progression on his recent scan. Followup in 3 months with repeat CT scan of the chest. 

## 2013-01-07 ENCOUNTER — Encounter (HOSPITAL_COMMUNITY): Payer: Self-pay

## 2013-01-07 ENCOUNTER — Ambulatory Visit (HOSPITAL_COMMUNITY)
Admission: RE | Admit: 2013-01-07 | Discharge: 2013-01-07 | Disposition: A | Discharge status: Home / Self Care | Payer: Medicare Other | Source: Ambulatory Visit | Attending: Internal Medicine | Admitting: Internal Medicine

## 2013-01-07 ENCOUNTER — Other Ambulatory Visit (HOSPITAL_BASED_OUTPATIENT_CLINIC_OR_DEPARTMENT_OTHER): Payer: Medicare Other | Admitting: Lab

## 2013-01-07 DIAGNOSIS — R599 Enlarged lymph nodes, unspecified: Secondary | ICD-10-CM | POA: Insufficient documentation

## 2013-01-07 DIAGNOSIS — J438 Other emphysema: Secondary | ICD-10-CM | POA: Insufficient documentation

## 2013-01-07 DIAGNOSIS — C3490 Malignant neoplasm of unspecified part of unspecified bronchus or lung: Secondary | ICD-10-CM | POA: Insufficient documentation

## 2013-01-07 DIAGNOSIS — I2510 Atherosclerotic heart disease of native coronary artery without angina pectoris: Secondary | ICD-10-CM | POA: Insufficient documentation

## 2013-01-07 DIAGNOSIS — I712 Thoracic aortic aneurysm, without rupture, unspecified: Secondary | ICD-10-CM | POA: Insufficient documentation

## 2013-01-07 DIAGNOSIS — C3430 Malignant neoplasm of lower lobe, unspecified bronchus or lung: Secondary | ICD-10-CM

## 2013-01-07 DIAGNOSIS — Z923 Personal history of irradiation: Secondary | ICD-10-CM | POA: Insufficient documentation

## 2013-01-07 LAB — CBC WITH DIFFERENTIAL/PLATELET
BASO%: 0.7 % (ref 0.0–2.0)
HCT: 40.7 % (ref 38.4–49.9)
LYMPH%: 18.8 % (ref 14.0–49.0)
MCHC: 33.2 g/dL (ref 32.0–36.0)
MCV: 93.3 fL (ref 79.3–98.0)
MONO#: 0.5 10*3/uL (ref 0.1–0.9)
MONO%: 7.5 % (ref 0.0–14.0)
NEUT%: 70.2 % (ref 39.0–75.0)
Platelets: 196 10*3/uL (ref 140–400)
RBC: 4.36 10*6/uL (ref 4.20–5.82)
WBC: 6.4 10*3/uL (ref 4.0–10.3)

## 2013-01-07 LAB — COMPREHENSIVE METABOLIC PANEL (CC13)
Alkaline Phosphatase: 79 U/L (ref 40–150)
CO2: 26 mEq/L (ref 22–29)
Creatinine: 1.3 mg/dL (ref 0.7–1.3)
Glucose: 92 mg/dl (ref 70–140)
Sodium: 141 mEq/L (ref 136–145)
Total Bilirubin: 0.3 mg/dL (ref 0.20–1.20)

## 2013-01-07 MED ORDER — IOHEXOL 300 MG/ML  SOLN
80.0000 mL | Freq: Once | INTRAMUSCULAR | Status: AC | PRN
  Administered 2013-01-07: 80 mL via INTRAVENOUS

## 2013-01-10 ENCOUNTER — Encounter: Payer: Self-pay | Admitting: Internal Medicine

## 2013-01-10 ENCOUNTER — Telehealth: Payer: Self-pay | Admitting: Internal Medicine

## 2013-01-10 ENCOUNTER — Ambulatory Visit (HOSPITAL_BASED_OUTPATIENT_CLINIC_OR_DEPARTMENT_OTHER): Payer: Medicare Other | Admitting: Internal Medicine

## 2013-01-10 DIAGNOSIS — C3430 Malignant neoplasm of lower lobe, unspecified bronchus or lung: Secondary | ICD-10-CM

## 2013-01-10 NOTE — Telephone Encounter (Signed)
GV AND PRINTED APPT SCHED AND AVS FORPT... °

## 2013-01-10 NOTE — Progress Notes (Signed)
[facility] Telephone:([phone]   Fax:([phone]  OFFICE PROGRESS NOTE  [name], MD [address] [place] [address]  DIAGNOSIS: Stage IIIB (T2a., N3, M0) non-small cell lung cancer consistent with invasive adenocarcinoma with negative ALK gene translocation and negative EGFR mutation diagnosed in October of 2013   PRIOR THERAPY:  1) Concurrent chemoradiation with weekly carboplatin for AUC of 2 and paclitaxel 45 mg/M2, last dose was given 06/11/2012 with partial response.  2) Consolidation chemotherapy with carboplatin for AUC of 5 and Alimta 500 mg/M2 every 3 weeks. Status post 3 cycles last dose was given 09/12/2012 with stable disease.   CURRENT THERAPY: Observation.  CHEMOTHERAPY INTENT: Control  CURRENT # OF CHEMOTHERAPY CYCLES: 0  CURRENT ANTIEMETICS: N/A  CURRENT SMOKING STATUS: Former Smoker  ORAL CHEMOTHERAPY AND CONSENT: None  CURRENT BISPHOSPHONATES USE: None  PAIN MANAGEMENT: 0/10  NARCOTICS INDUCED CONSTIPATION: None  LIVING WILL AND CODE STATUS: no code Blue  INTERVAL HISTORY: Logan Russell 78 y.o. male returns to the clinic today for three-month follow up visit accompanied by his wife. The patient has no complaints today. He denied having any significant chest pain but continues to have shortness breath with exertion,and dry cough or hemoptysis. He denied having any significant weight loss or night sweats. The patient has no nausea or vomiting.he had repeat CT scan of the chest performed recently and he is here for evaluation and discussion of his scan results.  MEDICAL HISTORY: Past Medical History  Diagnosis Date  . Hypertension   . Lung cancer 04/12/2012  . History of radiation therapy 05/02/12-06/19/12    left lung mediastinal lymph nodes 66Gy/30fx    ALLERGIES:  has No Known Allergies.  MEDICATIONS:  Current Outpatient Prescriptions  Medication Sig Dispense Refill  . guaiFENesin (MUCINEX) 600 MG  12 hr tablet Take 1,200 mg by mouth 2 (two) times daily.      Marland Kitchen triamterene-hydrochlorothiazide (MAXZIDE) 75-50 MG per tablet Take 1 tablet by mouth daily.       No current facility-administered medications for this visit.    REVIEW OF SYSTEMS:  A comprehensive review of systems was negative except for: Respiratory: positive for cough and dyspnea on exertion   PHYSICAL EXAMINATION: General appearance: alert, cooperative and no distress Head: Normocephalic, without obvious abnormality, atraumatic Neck: no adenopathy Lymph nodes: Cervical, supraclavicular, and axillary nodes normal. Resp: clear to auscultation bilaterally Cardio: regular rate and rhythm, S1, S2 normal, no murmur, click, rub or gallop GI: soft, non-tender; bowel sounds normal; no masses,  no organomegaly Extremities: extremities normal, atraumatic, no cyanosis or edema  ECOG PERFORMANCE STATUS: 1 - Symptomatic but completely ambulatory  Blood pressure 161/68, pulse 69, temperature 97.7 F (36.5 C), temperature source Oral, resp. rate 18, height 5' 8.5" (1.74 m), weight 187 lb (84.823 kg), SpO2 100.00%.  LABORATORY DATA: Lab Results  Component Value Date   WBC 6.4 01/07/2013   HGB 13.5 01/07/2013   HCT 40.7 01/07/2013   MCV 93.3 01/07/2013   PLT 196 01/07/2013      Chemistry      Component Value Date/Time   NA 141 01/07/2013 1000   K 4.3 01/07/2013 1000   CL 106 10/08/2012 1028   CO2 26 01/07/2013 1000   BUN 17.4 01/07/2013 1000   CREATININE 1.3 01/07/2013 1000      Component Value Date/Time   CALCIUM 9.8 01/07/2013 1000   ALKPHOS 79 01/07/2013 1000   AST 18 01/07/2013 1000  ALT 13 01/07/2013 1000   BILITOT 0.30 01/07/2013 1000       RADIOGRAPHIC STUDIES: Ct Chest W Contrast  01/07/2013   *RADIOLOGY REPORT*  Clinical Data: Restaging lung cancer.  Radiation therapy completed. Cough and shortness of breath.  CT CHEST WITH CONTRAST  Technique:  Multidetector CT imaging of the chest was performed following the standard  protocol during bolus administration of intravenous contrast.  Contrast: 80mL OMNIPAQUE IOHEXOL 300 MG/ML  SOLN  Comparison: 10/03/2012 and baseline exam 03/20/2012.  Findings: Mediastinal lymph nodes have decreased in size slightly in the interval.  High right paratracheal lymph node measures 8 mm short axis (previously 12 mm).  Adenopathy anterior to the SVC measures 11 mm short axis (previously 16 mm).  Low right paratracheal lymph node is stable in short axis size, 2 cm.  Soft tissue is seen along the bronchus intermedius, as before. Previously measured right hilar lymph node is not well seen as a discrete structure.  No left hilar or axillary adenopathy.  Atherosclerotic calcification of the arterial vasculature, including coronary arteries.  Heart size normal.  No pericardial effusion.  Ascending aorta measures 4.1 cm, stable.  There are small lymph nodes along the course of the descending thoracic aorta and esophagus, sub centimeter in size.  Emphysema.  Radiation changes including consolidation, bronchiectasis, volume loss and architectural distortion are seen in the medial aspect of the right hemithorax as well as left upper and left lower lobes.  Airspace nodularity in the right lower lobe (example image 29) may relate to radiation therapy.  7 mm nodule in the right lower lobe is unchanged from baseline exam of 03/20/2012. No pleural fluid.  Airway is otherwise unremarkable.  Incidental imaging of the upper abdomen shows low attenuation lesions in the liver measuring up to 13 mm, as before.  Low attenuation lesion in the left kidney measures 1.9 cm, as before, incompletely imaged.  Duodenal diverticulum is incidentally noted. No worrisome lytic or sclerotic lesions.  Degenerative changes are seen in the spine.  IMPRESSION:  1.  Slight interval decrease in size of mediastinal and right hilar adenopathy. 2.  Radiation scarring and volume loss in the hemithoraces bilaterally. A nodular lesion in the left  lower lobe is no longer identified. 3.  Borderline ascending aortic aneurysm, stable.   Original Report Authenticated By: Leanna Battles, M.D.    ASSESSMENT AND PLAN: this is a very pleasant 78 years old white male with history of stage IIIB non-small cell lung cancer status post concurrent chemoradiation followed by consolidation chemotherapy and has been observation for the last 4 months with no evidence for disease progression. I discussed the scan results with the patient and his wife. I recommended for him to continue on observation for now with repeat CT scan of the chest in 3 months. He was advised to call immediately if he has any concerning symptoms in the interval. The patient voices understanding of current disease status and treatment options and is in agreement with the current care plan.  All questions were answered. The patient knows to call the clinic with any problems, questions or concerns. We can certainly see the patient much sooner if necessary.

## 2013-01-12 NOTE — Patient Instructions (Signed)
CURRENT THERAPY: Observation.  CHEMOTHERAPY INTENT: Control  CURRENT # OF CHEMOTHERAPY CYCLES: 0  CURRENT ANTIEMETICS: N/A  CURRENT SMOKING STATUS: Former Smoker  ORAL CHEMOTHERAPY AND CONSENT: None  CURRENT BISPHOSPHONATES USE: None  PAIN MANAGEMENT: 0/10  NARCOTICS INDUCED CONSTIPATION: None  LIVING WILL AND CODE STATUS: no code Blue

## 2013-04-09 ENCOUNTER — Other Ambulatory Visit (HOSPITAL_BASED_OUTPATIENT_CLINIC_OR_DEPARTMENT_OTHER): Payer: Medicare Other | Admitting: Lab

## 2013-04-09 ENCOUNTER — Ambulatory Visit (HOSPITAL_COMMUNITY)
Admission: RE | Admit: 2013-04-09 | Discharge: 2013-04-09 | Disposition: A | Discharge status: Home / Self Care | Payer: Medicare Other | Source: Ambulatory Visit | Attending: Internal Medicine | Admitting: Internal Medicine

## 2013-04-09 ENCOUNTER — Encounter (INDEPENDENT_AMBULATORY_CARE_PROVIDER_SITE_OTHER): Payer: Self-pay

## 2013-04-09 DIAGNOSIS — Z923 Personal history of irradiation: Secondary | ICD-10-CM | POA: Insufficient documentation

## 2013-04-09 DIAGNOSIS — Z9221 Personal history of antineoplastic chemotherapy: Secondary | ICD-10-CM | POA: Insufficient documentation

## 2013-04-09 DIAGNOSIS — C3430 Malignant neoplasm of lower lobe, unspecified bronchus or lung: Secondary | ICD-10-CM

## 2013-04-09 DIAGNOSIS — I77819 Aortic ectasia, unspecified site: Secondary | ICD-10-CM | POA: Insufficient documentation

## 2013-04-09 DIAGNOSIS — C3490 Malignant neoplasm of unspecified part of unspecified bronchus or lung: Secondary | ICD-10-CM | POA: Insufficient documentation

## 2013-04-09 DIAGNOSIS — R918 Other nonspecific abnormal finding of lung field: Secondary | ICD-10-CM | POA: Insufficient documentation

## 2013-04-09 LAB — CBC WITH DIFFERENTIAL/PLATELET
BASO%: 0.6 % (ref 0.0–2.0)
EOS%: 3 % (ref 0.0–7.0)
Eosinophils Absolute: 0.2 10*3/uL (ref 0.0–0.5)
HCT: 42.4 % (ref 38.4–49.9)
HGB: 13.8 g/dL (ref 13.0–17.1)
MCHC: 32.7 g/dL (ref 32.0–36.0)
MONO#: 0.4 10*3/uL (ref 0.1–0.9)
NEUT#: 4.4 10*3/uL (ref 1.5–6.5)
NEUT%: 70.1 % (ref 39.0–75.0)
Platelets: 159 10*3/uL (ref 140–400)
WBC: 6.3 10*3/uL (ref 4.0–10.3)
lymph#: 1.2 10*3/uL (ref 0.9–3.3)

## 2013-04-09 LAB — COMPREHENSIVE METABOLIC PANEL (CC13)
ALT: 13 U/L (ref 0–55)
AST: 16 U/L (ref 5–34)
Albumin: 3.7 g/dL (ref 3.5–5.0)
Anion Gap: 9 mEq/L (ref 3–11)
CO2: 25 mEq/L (ref 22–29)
Chloride: 106 mEq/L (ref 98–109)
Creatinine: 1.3 mg/dL (ref 0.7–1.3)
Glucose: 94 mg/dl (ref 70–140)
Potassium: 4.2 mEq/L (ref 3.5–5.1)
Total Protein: 7.5 g/dL (ref 6.4–8.3)

## 2013-04-09 MED ORDER — IOHEXOL 300 MG/ML  SOLN
80.0000 mL | Freq: Once | INTRAMUSCULAR | Status: AC | PRN
  Administered 2013-04-09: 80 mL via INTRAVENOUS

## 2013-04-11 ENCOUNTER — Ambulatory Visit (HOSPITAL_BASED_OUTPATIENT_CLINIC_OR_DEPARTMENT_OTHER): Payer: Medicare Other | Admitting: Physician Assistant

## 2013-04-11 ENCOUNTER — Encounter: Payer: Self-pay | Admitting: Physician Assistant

## 2013-04-11 ENCOUNTER — Telehealth: Payer: Self-pay | Admitting: Internal Medicine

## 2013-04-11 DIAGNOSIS — C3430 Malignant neoplasm of lower lobe, unspecified bronchus or lung: Secondary | ICD-10-CM

## 2013-04-11 DIAGNOSIS — I10 Essential (primary) hypertension: Secondary | ICD-10-CM

## 2013-04-11 DIAGNOSIS — R918 Other nonspecific abnormal finding of lung field: Secondary | ICD-10-CM

## 2013-04-11 MED ORDER — CLONIDINE HCL 0.1 MG PO TABS: 0.2000 mg | ORAL_TABLET | Freq: Once | ORAL | Status: DC

## 2013-04-11 NOTE — Telephone Encounter (Signed)
appts made per 11/13 POF PET to call pt w appt AVS and Cal given to pt shh

## 2013-04-11 NOTE — Progress Notes (Addendum)
[facility] [phone]   Fax:([phone]  SHARED VISIT PROGRESS NOTE  [name], MD [address] [place] [address]  DIAGNOSIS: Stage IIIB (T2a., N3, M0) non-small cell lung cancer consistent with invasive adenocarcinoma with negative ALK gene translocation and negative EGFR mutation diagnosed in October of 2013   PRIOR THERAPY:  1) Concurrent chemoradiation with weekly carboplatin for AUC of 2 and paclitaxel 45 mg/M2, last dose was given 06/11/2012 with partial response.  2) Consolidation chemotherapy with carboplatin for AUC of 5 and Alimta 500 mg/M2 every 3 weeks. Status post 3 cycles last dose was given 09/12/2012 with stable disease.   CURRENT THERAPY: Observation.  CHEMOTHERAPY INTENT: Control  CURRENT # OF CHEMOTHERAPY CYCLES: 0  CURRENT ANTIEMETICS: N/A  CURRENT SMOKING STATUS: Former Smoker  ORAL CHEMOTHERAPY AND CONSENT: None  CURRENT BISPHOSPHONATES USE: None  PAIN MANAGEMENT: 0/10  NARCOTICS INDUCED CONSTIPATION: None  LIVING WILL AND CODE STATUS: no code Blue  INTERVAL HISTORY: Logan Russell 78 y.o. male returns to the clinic today for three-month follow up visit accompanied by his wife. The patient has no complaints today. He denied having any significant chest pain but continues to have shortness breath with exertion,and dry cough or hemoptysis. He denied having any significant weight loss or night sweats. The patient has no nausea or vomiting.he had repeat CT scan of the chest performed recently and he is here for evaluation and discussion of his scan results. The patient reports he has not taken his blood pressure medicine in over a month. He states he "lazy".  MEDICAL HISTORY: Past Medical History  Diagnosis Date  . Hypertension   . Lung cancer 04/12/2012  . History of radiation therapy 05/02/12-06/19/12    left lung mediastinal lymph nodes 66Gy/2fx    ALLERGIES:  has No Known  Allergies.  MEDICATIONS:  Current Outpatient Prescriptions  Medication Sig Dispense Refill  . guaiFENesin (MUCINEX) 600 MG 12 hr tablet Take 1,200 mg by mouth 2 (two) times daily.      Marland Kitchen triamterene-hydrochlorothiazide (MAXZIDE) 75-50 MG per tablet Take 1 tablet by mouth daily.       Current Facility-Administered Medications  Medication Dose Route Frequency Provider Last Rate Last Dose  . cloNIDine (CATAPRES) tablet 0.2 mg  0.2 mg Oral Once Conni Slipper, PA-C        REVIEW OF SYSTEMS:  A comprehensive review of systems was negative except for: Respiratory: positive for cough and dyspnea on exertion   PHYSICAL EXAMINATION: General appearance: alert, cooperative and no distress Head: Normocephalic, without obvious abnormality, atraumatic Neck: no adenopathy Lymph nodes: Cervical, supraclavicular, and axillary nodes normal. Resp: clear to auscultation bilaterally Cardio: regular rate and rhythm, S1, S2 normal, no murmur, click, rub or gallop GI: soft, non-tender; bowel sounds normal; no masses,  no organomegaly Extremities: extremities normal, atraumatic, no cyanosis or edema  ECOG PERFORMANCE STATUS: 1 - Symptomatic but completely ambulatory  Blood pressure 184/105, pulse 92, temperature 97.3 F (36.3 C), temperature source Oral, resp. rate 18, height 5' 8.5" (1.74 m), weight 193 lb 8 oz (87.771 kg).  LABORATORY DATA: Lab Results  Component Value Date   WBC 6.3 04/09/2013   HGB 13.8 04/09/2013   HCT 42.4 04/09/2013   MCV 94.1 04/09/2013   PLT 159 04/09/2013      Chemistry      Component Value Date/Time   NA 140 04/09/2013 0945   K 4.2 04/09/2013 0945   CL 106 10/08/2012 1028  CO2 25 04/09/2013 0945   BUN 24.6 04/09/2013 0945   CREATININE 1.3 04/09/2013 0945      Component Value Date/Time   CALCIUM 9.7 04/09/2013 0945   ALKPHOS 80 04/09/2013 0945   AST 16 04/09/2013 0945   ALT 13 04/09/2013 0945   BILITOT 0.39 04/09/2013 0945       RADIOGRAPHIC STUDIES: Ct  Chest W Contrast  04/09/2013   CLINICAL DATA:  Lung cancer diagnosed November 2013. Completed chemotherapy and radiation therapy. Cough.  EXAM: CT CHEST WITH CONTRAST  TECHNIQUE: Multidetector CT imaging of the chest was performed during intravenous contrast administration.  CONTRAST:  80mL OMNIPAQUE IOHEXOL 300 MG/ML  SOLN  COMPARISON:  01/07/2013  FINDINGS: 0.7 cm high right pretracheal lymph node image 19 is slightly decreased from 0.8 cm previously.  Pretracheal lymph node now measures 1.8 cm image 25, previously 2.0 cm.  Conglomerate infra carinal soft tissue density and nodes are subjectively stable. Again noted is bilateral perihilar linear soft tissue attenuation most compatible with radiation change.  Stable 4.1 cm at ectasia of the ascending aorta at the level of the main pulmonary artery image 32.  Heart size is normal. No pericardial effusion. No pleural effusion.  Stable emphysematous changes with areas of subpleural presumed scarring. Stable 7 mm right lower lobe nodule image 37 and irregular superior segment right lower lobe 1.4 cm opacity image 31. Other areas of subpleural and intraparenchymal density are stable. However, an irregular left lower lobe pulmonary nodule image 44 now measures 0.8 cm compared to 0.4 cm previously, and a 0.4 cm left lower lobe pulmonary nodule image 46 is slightly larger than 0.3 cm previously and appears subjectively fuller as well. 0.8 cm right lower lobe pulmonary parenchymal nodule image 42 is larger than 3 mm previously.  Degenerative change noted in the spine. No new lytic or sclerotic osseous lesion. L1 compression deformity is reidentified.  IMPRESSION: Increase in size of multiple bilateral pulmonary nodules as described above, whereas other findings such as mediastinal lymph nodes and presumed radiation fibrosis changes are stable to slightly smaller than previously. This is concerning for progression of local metastatic disease, although occasionally  infectious or inflammatory nodules could appear similar.   Electronically Signed   By: Christiana Pellant M.D.   On: 04/09/2013 13:41    ASSESSMENT AND PLAN: this is a very pleasant 78 years old white male with history of stage IIIB non-small cell lung cancer status post concurrent chemoradiation followed by consolidation chemotherapy and has been observation for the last 3 months with no evidence for disease progression. The CT scan revealed increase in the size of multiple bilateral pulmonary nodules with the mediastinal lymph nodes in the presumed radiation fibrosis changes stable to slightly smaller than previously. There was concern for progression of local metastatic disease although occasionally infectious or laboratory nodules could appear similarly. Patient was discussed with an also seen by Dr. Arbutus Ped. The patient was extremely hypotensive today with a blood pressure 180 08/29/2003. He was given 0.2 mg of clonidine x1 dose and strongly encouraged to resume his antihypertensive medication, max side, and followup with his primary care physician regarding his blood pressure control as soon as possible. Patient voiced understanding of these instructions. We will send patient for a PET scan to evaluate the pulmonary nodules further. If there is hyperactivity and in the uptake patient will return for discussion of treatment options however if the nodules are negative in terms of hyperactivity he will continue on observation and return in  3 months with another CT of the chest with contrast to reevaluate his disease.   Laural Benes, Ja Ohman E, PA-C   He was advised to call immediately if he has any concerning symptoms in the interval. The patient voices understanding of current disease status and treatment options and is in agreement with the current care plan.  All questions were answered. The patient knows to call the clinic with any problems, questions or concerns. We can certainly see the patient much  sooner if necessary.  ADDENDUM: Hematology/Oncology Attending: I had the face-to-face encounter with the patient. I recommended his plan. The patient is a 78 years old white male with history of stage IIIB non-small cell lung cancer status post concurrent chemoradiation followed by consolidation chemotherapy and has been observation since April 2014 but unfortunately the recent CT scan of the chest showed increase in size of multiple bilateral pulmonary nodules but stable mediastinal lymphadenopathy. I recommended for the patient to have a PET scan performed for further evaluation of these lesions. He would come back for follow up visit in 3 weeks for evaluation and discussion of his PET scan results. He was advised to call immediately if he has any concerning symptoms in the interval. Lajuana Matte., MD 04/13/2013

## 2013-04-12 NOTE — Patient Instructions (Signed)
Resume your high blood pressure medication as previously prescribed and followup with your primary care physician regarding her blood pressure control as soon as possible Keep the appointment for her at the PET scan to reevaluate the pulmonary nodules Pending the outcome of the PET scan followup as scheduled Otherwise followup in 3 months with another repeat CT scan of your chest to reevaluate her disease

## 2013-04-23 ENCOUNTER — Encounter (HOSPITAL_COMMUNITY)
Admission: RE | Admit: 2013-04-23 | Discharge: 2013-04-23 | Disposition: A | Discharge status: Home / Self Care | Payer: Medicare Other | Source: Ambulatory Visit | Attending: Physician Assistant | Admitting: Physician Assistant

## 2013-04-23 DIAGNOSIS — C3490 Malignant neoplasm of unspecified part of unspecified bronchus or lung: Secondary | ICD-10-CM | POA: Insufficient documentation

## 2013-04-23 LAB — GLUCOSE, CAPILLARY: Glucose-Capillary: 94 mg/dL (ref 70–99)

## 2013-04-23 MED ORDER — FLUDEOXYGLUCOSE F - 18 (FDG) INJECTION
21.6000 | Freq: Once | INTRAVENOUS | Status: AC | PRN
  Administered 2013-04-23: 21.6 via INTRAVENOUS

## 2013-04-30 ENCOUNTER — Telehealth: Payer: Self-pay | Admitting: Internal Medicine

## 2013-04-30 ENCOUNTER — Ambulatory Visit (HOSPITAL_BASED_OUTPATIENT_CLINIC_OR_DEPARTMENT_OTHER): Payer: Medicare Other | Admitting: Internal Medicine

## 2013-04-30 ENCOUNTER — Encounter: Payer: Self-pay | Admitting: Internal Medicine

## 2013-04-30 ENCOUNTER — Other Ambulatory Visit (HOSPITAL_BASED_OUTPATIENT_CLINIC_OR_DEPARTMENT_OTHER): Payer: Medicare Other | Admitting: Lab

## 2013-04-30 DIAGNOSIS — M899 Disorder of bone, unspecified: Secondary | ICD-10-CM

## 2013-04-30 DIAGNOSIS — C3430 Malignant neoplasm of lower lobe, unspecified bronchus or lung: Secondary | ICD-10-CM

## 2013-04-30 LAB — COMPREHENSIVE METABOLIC PANEL (CC13)
ALT: 19 U/L (ref 0–55)
AST: 19 U/L (ref 5–34)
Alkaline Phosphatase: 82 U/L (ref 40–150)
Anion Gap: 11 mEq/L (ref 3–11)
CO2: 25 mEq/L (ref 22–29)
Creatinine: 1.5 mg/dL — ABNORMAL HIGH (ref 0.7–1.3)
Total Bilirubin: 0.37 mg/dL (ref 0.20–1.20)

## 2013-04-30 LAB — CBC WITH DIFFERENTIAL/PLATELET
BASO%: 0.3 % (ref 0.0–2.0)
Basophils Absolute: 0 10*3/uL (ref 0.0–0.1)
EOS%: 2 % (ref 0.0–7.0)
HCT: 42.5 % (ref 38.4–49.9)
HGB: 14.2 g/dL (ref 13.0–17.1)
LYMPH%: 14.6 % (ref 14.0–49.0)
MCH: 31.7 pg (ref 27.2–33.4)
MCHC: 33.3 g/dL (ref 32.0–36.0)
MCV: 95.2 fL (ref 79.3–98.0)
MONO%: 5.9 % (ref 0.0–14.0)
NEUT%: 77.2 % — ABNORMAL HIGH (ref 39.0–75.0)
Platelets: 162 10*3/uL (ref 140–400)
WBC: 7.6 10*3/uL (ref 4.0–10.3)

## 2013-04-30 NOTE — Progress Notes (Signed)
[facility] [phone]   Fax:(336) [phone]  OFFICE PROGRESS NOTE  [name], MD [address] [place] [address]  DIAGNOSIS: Stage IIIB (T2a., N3, M0) non-small cell lung cancer consistent with invasive adenocarcinoma with negative ALK gene translocation and negative EGFR mutation diagnosed in October of 2013   PRIOR THERAPY:  1) Concurrent chemoradiation with weekly carboplatin for AUC of 2 and paclitaxel 45 mg/M2, last dose was given 06/11/2012 with partial response.  2) Consolidation chemotherapy with carboplatin for AUC of 5 and Alimta 500 mg/M2 every 3 weeks. Status post 3 cycles last dose was given 09/12/2012 with stable disease.   CURRENT THERAPY: Observation.  CHEMOTHERAPY INTENT: Control  CURRENT # OF CHEMOTHERAPY CYCLES: 0  CURRENT ANTIEMETICS: N/A  CURRENT SMOKING STATUS: Former Smoker  ORAL CHEMOTHERAPY AND CONSENT: None  CURRENT BISPHOSPHONATES USE: None  PAIN MANAGEMENT: 0/10  NARCOTICS INDUCED CONSTIPATION: None  LIVING WILL AND CODE STATUS: no code Blue  INTERVAL HISTORY: AUDRIC VENN 78 y.o. male returns to the clinic today for three-month follow up visit accompanied by his wife. The patient has no complaints today. He denied having any significant chest pain but continues to have shortness breath with exertion,and dry cough or hemoptysis. He denied having any significant weight loss or night sweats. The patient has no nausea or vomiting. His recent CT scan of the chest showed increase in the size of multiple bilateral pulmonary nodules. I ordered a PET scan and the patient is here today for evaluation and discussion of his scan results.  MEDICAL HISTORY: Past Medical History  Diagnosis Date  . Hypertension   . Lung cancer 04/12/2012  . History of radiation therapy 05/02/12-06/19/12    left lung mediastinal lymph nodes 66Gy/18fx    ALLERGIES:  has No Known Allergies.  MEDICATIONS:  Current  Outpatient Prescriptions  Medication Sig Dispense Refill  . guaiFENesin (MUCINEX) 600 MG 12 hr tablet Take 1,200 mg by mouth 2 (two) times daily.      Marland Kitchen triamterene-hydrochlorothiazide (MAXZIDE) 75-50 MG per tablet Take 1 tablet by mouth daily.      Marland Kitchen PNEUMOVAX 23 25 MCG/0.5ML injection        No current facility-administered medications for this visit.    REVIEW OF SYSTEMS:  A comprehensive review of systems was negative except for: Respiratory: positive for cough and dyspnea on exertion   PHYSICAL EXAMINATION: General appearance: alert, cooperative and no distress Head: Normocephalic, without obvious abnormality, atraumatic Neck: no adenopathy Lymph nodes: Cervical, supraclavicular, and axillary nodes normal. Resp: clear to auscultation bilaterally Cardio: regular rate and rhythm, S1, S2 normal, no murmur, click, rub or gallop GI: soft, non-tender; bowel sounds normal; no masses,  no organomegaly Extremities: extremities normal, atraumatic, no cyanosis or edema  ECOG PERFORMANCE STATUS: 1 - Symptomatic but completely ambulatory  Blood pressure 175/98, pulse 97, temperature 97 F (36.1 C), temperature source Oral, resp. rate 18, height 5' 8.5" (1.74 m), weight 193 lb (87.544 kg).  LABORATORY DATA: Lab Results  Component Value Date   WBC 7.6 04/30/2013   HGB 14.2 04/30/2013   HCT 42.5 04/30/2013   MCV 95.2 04/30/2013   PLT 162 04/30/2013      Chemistry      Component Value Date/Time   NA 140 04/09/2013 0945   K 4.2 04/09/2013 0945   CL 106 10/08/2012 1028   CO2 25 04/09/2013 0945   BUN 24.6 04/09/2013 0945   CREATININE 1.3 04/09/2013 0945  Component Value Date/Time   CALCIUM 9.7 04/09/2013 0945   ALKPHOS 80 04/09/2013 0945   AST 16 04/09/2013 0945   ALT 13 04/09/2013 0945   BILITOT 0.39 04/09/2013 0945       RADIOGRAPHIC STUDIES: Ct Chest W Contrast  04/09/2013   CLINICAL DATA:  Lung cancer diagnosed November 2013. Completed chemotherapy and radiation therapy.  Cough.  EXAM: CT CHEST WITH CONTRAST  TECHNIQUE: Multidetector CT imaging of the chest was performed during intravenous contrast administration.  CONTRAST:  80mL OMNIPAQUE IOHEXOL 300 MG/ML  SOLN  COMPARISON:  01/07/2013  FINDINGS: 0.7 cm high right pretracheal lymph node image 19 is slightly decreased from 0.8 cm previously.  Pretracheal lymph node now measures 1.8 cm image 25, previously 2.0 cm.  Conglomerate infra carinal soft tissue density and nodes are subjectively stable. Again noted is bilateral perihilar linear soft tissue attenuation most compatible with radiation change.  Stable 4.1 cm at ectasia of the ascending aorta at the level of the main pulmonary artery image 32.  Heart size is normal. No pericardial effusion. No pleural effusion.  Stable emphysematous changes with areas of subpleural presumed scarring. Stable 7 mm right lower lobe nodule image 37 and irregular superior segment right lower lobe 1.4 cm opacity image 31. Other areas of subpleural and intraparenchymal density are stable. However, an irregular left lower lobe pulmonary nodule image 44 now measures 0.8 cm compared to 0.4 cm previously, and a 0.4 cm left lower lobe pulmonary nodule image 46 is slightly larger than 0.3 cm previously and appears subjectively fuller as well. 0.8 cm right lower lobe pulmonary parenchymal nodule image 42 is larger than 3 mm previously.  Degenerative change noted in the spine. No new lytic or sclerotic osseous lesion. L1 compression deformity is reidentified.  IMPRESSION: Increase in size of multiple bilateral pulmonary nodules as described above, whereas other findings such as mediastinal lymph nodes and presumed radiation fibrosis changes are stable to slightly smaller than previously. This is concerning for progression of local metastatic disease, although occasionally infectious or inflammatory nodules could appear similar.   Electronically Signed   By: Christiana Pellant M.D.   On: 04/09/2013 13:41   Nm  Pet Image Restag (ps) Skull Base To Thigh  04/23/2013   CLINICAL DATA:  Subsequent treatment strategy for lung carcinoma.  EXAM: NUCLEAR MEDICINE PET SKULL BASE TO THIGH  FASTING BLOOD GLUCOSE:  Value: 94mg /dl  TECHNIQUE: 45.4 mCi U-98 FDG was injected intravenously. CT data was obtained and used for attenuation correction and anatomic localization only. (This was not acquired as a diagnostic CT examination.) Additional exam technical data entered on technologist worksheet.  COMPARISON:  CT 04/09/2013, PET-CT 03/28/2012  FINDINGS: NECK  No hypermetabolic lymph nodes in the neck.  CHEST  14 mm nodule of concern in the right lower lobe (image 95) has associated metabolic activity with SUV max 3.8. Smaller lesion at immediately above measuring 8 mm (image 90) also has metabolic activity but to lesser degree. There are no clear hypermetabolic mediastinal lymph nodes. There is a perihilar fibrotic thickening and mild metabolic activity related to prior radiation therapy and surgical therapy.  There is consolidative pattern in the left lower lobe with some peripheral nodularity with mild metabolic activity no significant change from prior. Several smaller nodules are too small to characterize by PET imaging.  ABDOMEN/PELVIS  No abnormal hypermetabolic activity within the liver, pancreas, adrenal glands, or spleen. No hypermetabolic lymph nodes in the abdomen or pelvis.  SKELETON  There is new  linear hypermetabolic activity associated with the spinous process of the L5 vertebral body along the superior border with intense metabolic activity(image 178). There is some loss of cortical definition on image 180 which appears new from prior.  IMPRESSION: 1. Two nodules within the right lower lobe are enlarged compared to prior and hypermetabolic. These nodules are indeterminate but concerning. Recommend either short-term follow-up or percutaneous or bronchoscopic sampling.  2. Consolidation and nodularity in left lower lobe  are similar with milder metabolic activity. Recommend continued attention on follow-up.  3. New linear hypermetabolic activity associated with the superior aspect of the spinous process of the L5 vertebral body with loss of cortical definition. While this could represent arthropathy (Bastrup's disease) concern for a metastatic lesion is high. Consider MRI of the lumbar spine with contrast for further evaluation.   Electronically Signed   By: Genevive Bi M.D.   On: 04/23/2013 17:25    ASSESSMENT AND PLAN: this is a very pleasant 78 years old white male with history of stage IIIB non-small cell lung cancer status post concurrent chemoradiation followed by consolidation chemotherapy and has been observation for the last 4 months with no evidence for disease progression. I discussed the scan results with the patient and his wife. The PET scan showed 2 nodules within the right lower lobe that are enlarged compared to the prior exam and without hypermetabolic. These nodules are indeterminant but concerning for malignancy. I discussed the PET scan results with the patient and his wife. I gave him the option of proceeding with a biopsy versus continuous observation and repeat CT scan in a few months. The patient is not interested in proceeding with the biopsy at this point and would like to continue on observation.  I would see him back for followup visit in 3 months with repeat CT scan of the chest. He was advised to call immediately if he has any concerning symptoms in the interval. The patient voices understanding of current disease status and treatment options and is in agreement with the current care plan.  All questions were answered. The patient knows to call the clinic with any problems, questions or concerns. We can certainly see the patient much sooner if necessary.

## 2013-04-30 NOTE — Telephone Encounter (Signed)
appts made per 12/2 POF Pt adv CT will call w appt AVS and CAL given shh

## 2013-04-30 NOTE — Patient Instructions (Signed)
Followup visit in 3 months with repeat CT scan of the chest. 

## 2013-07-01 ENCOUNTER — Telehealth: Payer: Self-pay | Admitting: Internal Medicine

## 2013-07-01 NOTE — Telephone Encounter (Signed)
returned pt call and s.w wife and confirmed March appts

## 2013-07-29 ENCOUNTER — Other Ambulatory Visit (HOSPITAL_BASED_OUTPATIENT_CLINIC_OR_DEPARTMENT_OTHER): Payer: Medicare Other

## 2013-07-29 ENCOUNTER — Ambulatory Visit: Payer: Medicare Other | Admitting: Internal Medicine

## 2013-07-29 ENCOUNTER — Other Ambulatory Visit: Payer: Medicare Other | Admitting: Lab

## 2013-07-29 ENCOUNTER — Ambulatory Visit (HOSPITAL_COMMUNITY)
Admission: RE | Admit: 2013-07-29 | Discharge: 2013-07-29 | Disposition: A | Discharge status: Home / Self Care | Payer: Medicare Other | Source: Ambulatory Visit | Attending: Internal Medicine | Admitting: Internal Medicine

## 2013-07-29 DIAGNOSIS — C3430 Malignant neoplasm of lower lobe, unspecified bronchus or lung: Secondary | ICD-10-CM

## 2013-07-29 DIAGNOSIS — C3490 Malignant neoplasm of unspecified part of unspecified bronchus or lung: Secondary | ICD-10-CM | POA: Insufficient documentation

## 2013-07-29 DIAGNOSIS — Z923 Personal history of irradiation: Secondary | ICD-10-CM | POA: Insufficient documentation

## 2013-07-29 DIAGNOSIS — K7689 Other specified diseases of liver: Secondary | ICD-10-CM | POA: Insufficient documentation

## 2013-07-29 DIAGNOSIS — Z9221 Personal history of antineoplastic chemotherapy: Secondary | ICD-10-CM | POA: Insufficient documentation

## 2013-07-29 DIAGNOSIS — Z87891 Personal history of nicotine dependence: Secondary | ICD-10-CM | POA: Insufficient documentation

## 2013-07-29 DIAGNOSIS — R599 Enlarged lymph nodes, unspecified: Secondary | ICD-10-CM | POA: Insufficient documentation

## 2013-07-29 LAB — CBC WITH DIFFERENTIAL/PLATELET
BASO%: 0.6 % (ref 0.0–2.0)
Basophils Absolute: 0 10*3/uL (ref 0.0–0.1)
EOS ABS: 0.2 10*3/uL (ref 0.0–0.5)
EOS%: 2.7 % (ref 0.0–7.0)
HCT: 44.7 % (ref 38.4–49.9)
HGB: 15 g/dL (ref 13.0–17.1)
LYMPH%: 16.1 % (ref 14.0–49.0)
MCH: 31.9 pg (ref 27.2–33.4)
MCHC: 33.5 g/dL (ref 32.0–36.0)
MCV: 95.1 fL (ref 79.3–98.0)
MONO#: 0.6 10*3/uL (ref 0.1–0.9)
MONO%: 8.3 % (ref 0.0–14.0)
NEUT%: 72.3 % (ref 39.0–75.0)
NEUTROS ABS: 5 10*3/uL (ref 1.5–6.5)
PLATELETS: 187 10*3/uL (ref 140–400)
RBC: 4.69 10*6/uL (ref 4.20–5.82)
RDW: 14.3 % (ref 11.0–14.6)
WBC: 7 10*3/uL (ref 4.0–10.3)
lymph#: 1.1 10*3/uL (ref 0.9–3.3)

## 2013-07-29 LAB — COMPREHENSIVE METABOLIC PANEL (CC13)
ALBUMIN: 4.1 g/dL (ref 3.5–5.0)
ALK PHOS: 91 U/L (ref 40–150)
ALT: 17 U/L (ref 0–55)
AST: 20 U/L (ref 5–34)
Anion Gap: 7 mEq/L (ref 3–11)
BILIRUBIN TOTAL: 0.5 mg/dL (ref 0.20–1.20)
BUN: 29.3 mg/dL — ABNORMAL HIGH (ref 7.0–26.0)
CO2: 26 mEq/L (ref 22–29)
Calcium: 9.7 mg/dL (ref 8.4–10.4)
Chloride: 103 mEq/L (ref 98–109)
Creatinine: 1.7 mg/dL — ABNORMAL HIGH (ref 0.7–1.3)
GLUCOSE: 95 mg/dL (ref 70–140)
POTASSIUM: 3.9 meq/L (ref 3.5–5.1)
SODIUM: 136 meq/L (ref 136–145)
TOTAL PROTEIN: 7.8 g/dL (ref 6.4–8.3)

## 2013-07-29 MED ORDER — IOHEXOL 300 MG/ML  SOLN
80.0000 mL | Freq: Once | INTRAMUSCULAR | Status: AC | PRN
  Administered 2013-07-29: 80 mL via INTRAVENOUS

## 2013-07-30 ENCOUNTER — Encounter: Payer: Self-pay | Admitting: Internal Medicine

## 2013-07-30 ENCOUNTER — Ambulatory Visit (HOSPITAL_BASED_OUTPATIENT_CLINIC_OR_DEPARTMENT_OTHER): Payer: Medicare Other | Admitting: Internal Medicine

## 2013-07-30 VITALS — BP 152/84 | HR 95 | Temp 97.8°F | Resp 20 | Ht 68.5 in | Wt 198.4 lb

## 2013-07-30 DIAGNOSIS — R05 Cough: Secondary | ICD-10-CM

## 2013-07-30 DIAGNOSIS — C3430 Malignant neoplasm of lower lobe, unspecified bronchus or lung: Secondary | ICD-10-CM

## 2013-07-30 DIAGNOSIS — R059 Cough, unspecified: Secondary | ICD-10-CM

## 2013-07-30 DIAGNOSIS — R0609 Other forms of dyspnea: Secondary | ICD-10-CM

## 2013-07-30 DIAGNOSIS — C3490 Malignant neoplasm of unspecified part of unspecified bronchus or lung: Secondary | ICD-10-CM

## 2013-07-30 DIAGNOSIS — R0989 Other specified symptoms and signs involving the circulatory and respiratory systems: Secondary | ICD-10-CM

## 2013-07-30 NOTE — Progress Notes (Signed)
[facility] Telephone:([phone]) [phone]   Fax:(336) [phone]  OFFICE PROGRESS NOTE  [name], MD [place] [address]  DIAGNOSIS: Stage IIIB (T2a., N3, M0) non-small cell lung cancer consistent with invasive adenocarcinoma with negative ALK gene translocation and negative EGFR mutation diagnosed in October of 2013   PRIOR THERAPY:  1) Concurrent chemoradiation with weekly carboplatin for AUC of 2 and paclitaxel 45 mg/M2, last dose was given 06/11/2012 with partial response.  2) Consolidation chemotherapy with carboplatin for AUC of 5 and Alimta 500 mg/M2 every 3 weeks. Status post 3 cycles last dose was given 09/12/2012 with stable disease.   CURRENT THERAPY: Observation.  CHEMOTHERAPY INTENT: Control  CURRENT # OF CHEMOTHERAPY CYCLES: 0  CURRENT ANTIEMETICS: N/A  CURRENT SMOKING STATUS: Former Smoker  ORAL CHEMOTHERAPY AND CONSENT: None  CURRENT BISPHOSPHONATES USE: None  PAIN MANAGEMENT: 0/10  NARCOTICS INDUCED CONSTIPATION: None  LIVING WILL AND CODE STATUS: no code Blue  INTERVAL HISTORY: TAYTEN BERGDOLL 79 y.o. male returns to the clinic today for three-month follow up visit accompanied by his wife. The patient has no complaints today. He denied having any significant chest pain but continues to have shortness breath with exertion,and dry cough or hemoptysis. He denied having any significant weight loss or night sweats. The patient has no nausea or vomiting. He has recent CT scan of the chest performed and he is here for evaluation and discussion of his scan results.  MEDICAL HISTORY: Past Medical History  Diagnosis Date  . Hypertension   . Lung cancer 04/12/2012  . History of radiation therapy 05/02/12-06/19/12    left lung mediastinal lymph nodes 66Gy/79fx    ALLERGIES:  has No Known Allergies.  MEDICATIONS:  Current Outpatient Prescriptions  Medication Sig Dispense Refill  . guaiFENesin (MUCINEX) 600 MG 12 hr  tablet Take 1,200 mg by mouth 2 (two) times daily.      Marland Kitchen PNEUMOVAX 23 25 MCG/0.5ML injection       . triamterene-hydrochlorothiazide (MAXZIDE) 75-50 MG per tablet Take 1 tablet by mouth daily.       No current facility-administered medications for this visit.    REVIEW OF SYSTEMS:  A comprehensive review of systems was negative except for: Respiratory: positive for cough and dyspnea on exertion   PHYSICAL EXAMINATION: General appearance: alert, cooperative and no distress Head: Normocephalic, without obvious abnormality, atraumatic Neck: no adenopathy Lymph nodes: Cervical, supraclavicular, and axillary nodes normal. Resp: clear to auscultation bilaterally Cardio: regular rate and rhythm, S1, S2 normal, no murmur, click, rub or gallop GI: soft, non-tender; bowel sounds normal; no masses,  no organomegaly Extremities: extremities normal, atraumatic, no cyanosis or edema  ECOG PERFORMANCE STATUS: 1 - Symptomatic but completely ambulatory  Blood pressure 152/84, pulse 95, temperature 97.8 F (36.6 C), temperature source Oral, resp. rate 20, height 5' 8.5" (1.74 m), weight 198 lb 6.4 oz (89.994 kg).  LABORATORY DATA: Lab Results  Component Value Date   WBC 7.0 07/29/2013   HGB 15.0 07/29/2013   HCT 44.7 07/29/2013   MCV 95.1 07/29/2013   PLT 187 07/29/2013      Chemistry      Component Value Date/Time   NA 136 07/29/2013 0901   K 3.9 07/29/2013 0901   CL 106 10/08/2012 1028   CO2 26 07/29/2013 0901   BUN 29.3* 07/29/2013 0901   CREATININE 1.7* 07/29/2013 0901      Component Value Date/Time   CALCIUM 9.7 07/29/2013 0901  ALKPHOS 91 07/29/2013 0901   AST 20 07/29/2013 0901   ALT 17 07/29/2013 0901   BILITOT 0.50 07/29/2013 0901       RADIOGRAPHIC STUDIES: Ct Chest W Contrast  07/29/2013   CLINICAL DATA:  Restaging of lung cancer diagnosed 11/13. Prior chemotherapy and radiation therapy. Ex-smoker  EXAM: CT CHEST WITH CONTRAST  TECHNIQUE: Multidetector CT imaging of the chest was performed during  intravenous contrast administration.  CONTRAST:  38mL OMNIPAQUE IOHEXOL 300 MG/ML  SOLN  COMPARISON:  PET 04/23/2013.  Chest CT 04/09/2013.  FINDINGS: Lungs/Pleura:  Mild centrilobular emphysema.  Right lower lobe pleural-based nodule measures 1.3 cm on image 42 versus 8 mm on the prior. Slightly less well-defined today. The more anterior right lower lobe nodule measures 8 mm on image 38 and is similar to 7 mm on the prior.  More cephalad right lower lobe nodule measures 1.6 x 1.3 cm today versus 1.4 x 1.5 cm on the prior. Similar.  Mild nodularity just posterior to the right major fissure is somewhat ill-defined and measures on the order of 1.5 cm on image 27. This is unchanged.  Left lower lobe nodularity is also identified. Index 5 mm node on image 43 measure 3 mm on the prior.  9 mm anterior left lower lobe nodule on image 42 measured 8 mm on the prior.  Similar distribution of paramediastinal fibrosis which is likely radiation induced. There is similar airspace disease and bronchiectasis at the left lower lobe which is likely also radiation induced. No pleural fluid.  Heart/Mediastinum: No supraclavicular adenopathy. Aortic and branch vessel atherosclerosis. Normal heart size, without pericardial effusion. No central pulmonary embolism, on this non-dedicated study. High right paratracheal node measures 1.2 cm on image 11 and is unchanged.  A low right paratracheal node measures 1.7 cm on image 23 versus 1.8 cm on the prior. Subcarinal nodal tissue is similar and upper normal in size. No well-defined hilar adenopathy. Prominent paraesophageal nodes, which measure up to 11 mm, unchanged. A prevascular node measures 1.0 cm and is similar on image 23.  Upper Abdomen: Small hepatic cysts. Subcentimeter focus of hyper enhancement the left lobe of the liver on image 50 is indeterminate. Not readily apparent on the prior. Descending duodenal diverticulum. Extensive colonic diverticulosis. Normal adrenal glands.  Interpolar left renal cyst.  Bones/Musculoskeletal:  No acute osseous abnormality.  IMPRESSION: 1. Minimal increase in pulmonary nodules. 2. Similar thoracic adenopathy. 3. No new areas of disease identified.   Electronically Signed   By: Abigail Miyamoto M.D.   On: 07/29/2013 12:44    ASSESSMENT AND PLAN: this is a very pleasant 79 years old white male with history of stage IIIB non-small cell lung cancer status post concurrent chemoradiation followed by consolidation chemotherapy and has been observation with no evidence for disease progression. I discussed the scan results with the patient and his wife. I recommended for him to continue on observation with repeat CT scan of the chest in 3 months. He was advised to call immediately if he has any concerning symptoms in the interval. The patient voices understanding of current disease status and treatment options and is in agreement with the current care plan.  All questions were answered. The patient knows to call the clinic with any problems, questions or concerns. We can certainly see the patient much sooner if necessary.  Disclaimer: This note was dictated with voice recognition software. Similar sounding words can inadvertently be transcribed and may not be corrected upon review.

## 2013-07-31 ENCOUNTER — Telehealth: Payer: Self-pay | Admitting: Internal Medicine

## 2013-07-31 NOTE — Telephone Encounter (Signed)
s.w. pt wife and advised on June appts.....mailed pt appt sched /avs and letter

## 2013-10-29 ENCOUNTER — Other Ambulatory Visit (HOSPITAL_BASED_OUTPATIENT_CLINIC_OR_DEPARTMENT_OTHER): Payer: Medicare Other

## 2013-10-29 ENCOUNTER — Ambulatory Visit (HOSPITAL_COMMUNITY)
Admission: RE | Admit: 2013-10-29 | Discharge: 2013-10-29 | Disposition: A | Discharge status: Home / Self Care | Payer: Medicare Other | Source: Ambulatory Visit | Attending: Internal Medicine | Admitting: Internal Medicine

## 2013-10-29 ENCOUNTER — Encounter (HOSPITAL_COMMUNITY): Payer: Self-pay

## 2013-10-29 DIAGNOSIS — C3490 Malignant neoplasm of unspecified part of unspecified bronchus or lung: Secondary | ICD-10-CM | POA: Insufficient documentation

## 2013-10-29 DIAGNOSIS — Z9221 Personal history of antineoplastic chemotherapy: Secondary | ICD-10-CM | POA: Insufficient documentation

## 2013-10-29 DIAGNOSIS — C3430 Malignant neoplasm of lower lobe, unspecified bronchus or lung: Secondary | ICD-10-CM

## 2013-10-29 DIAGNOSIS — Z923 Personal history of irradiation: Secondary | ICD-10-CM | POA: Insufficient documentation

## 2013-10-29 LAB — COMPREHENSIVE METABOLIC PANEL (CC13)
ALT: 12 U/L (ref 0–55)
ANION GAP: 15 meq/L — AB (ref 3–11)
AST: 17 U/L (ref 5–34)
Albumin: 3.7 g/dL (ref 3.5–5.0)
Alkaline Phosphatase: 109 U/L (ref 40–150)
BILIRUBIN TOTAL: 0.52 mg/dL (ref 0.20–1.20)
BUN: 25.7 mg/dL (ref 7.0–26.0)
CO2: 24 meq/L (ref 22–29)
CREATININE: 1.6 mg/dL — AB (ref 0.7–1.3)
Calcium: 9.6 mg/dL (ref 8.4–10.4)
Chloride: 103 mEq/L (ref 98–109)
Glucose: 91 mg/dl (ref 70–140)
Potassium: 4 mEq/L (ref 3.5–5.1)
Sodium: 141 mEq/L (ref 136–145)
Total Protein: 7.2 g/dL (ref 6.4–8.3)

## 2013-10-29 LAB — CBC WITH DIFFERENTIAL/PLATELET
BASO%: 0.8 % (ref 0.0–2.0)
Basophils Absolute: 0.1 10*3/uL (ref 0.0–0.1)
EOS%: 5.9 % (ref 0.0–7.0)
Eosinophils Absolute: 0.4 10*3/uL (ref 0.0–0.5)
HCT: 43.9 % (ref 38.4–49.9)
HGB: 14.7 g/dL (ref 13.0–17.1)
LYMPH%: 12.8 % — ABNORMAL LOW (ref 14.0–49.0)
MCH: 31.6 pg (ref 27.2–33.4)
MCHC: 33.4 g/dL (ref 32.0–36.0)
MCV: 94.4 fL (ref 79.3–98.0)
MONO#: 0.6 10*3/uL (ref 0.1–0.9)
MONO%: 8.4 % (ref 0.0–14.0)
NEUT#: 5.4 10*3/uL (ref 1.5–6.5)
NEUT%: 72.1 % (ref 39.0–75.0)
PLATELETS: 179 10*3/uL (ref 140–400)
RBC: 4.65 10*6/uL (ref 4.20–5.82)
RDW: 13.7 % (ref 11.0–14.6)
WBC: 7.5 10*3/uL (ref 4.0–10.3)
lymph#: 1 10*3/uL (ref 0.9–3.3)

## 2013-10-29 MED ORDER — IOHEXOL 300 MG/ML  SOLN
80.0000 mL | Freq: Once | INTRAMUSCULAR | Status: AC | PRN
  Administered 2013-10-29: 80 mL via INTRAVENOUS

## 2013-11-05 ENCOUNTER — Ambulatory Visit (HOSPITAL_BASED_OUTPATIENT_CLINIC_OR_DEPARTMENT_OTHER): Payer: Medicare Other | Admitting: Internal Medicine

## 2013-11-05 ENCOUNTER — Encounter: Payer: Self-pay | Admitting: Internal Medicine

## 2013-11-05 ENCOUNTER — Telehealth: Payer: Self-pay | Admitting: Internal Medicine

## 2013-11-05 VITALS — BP 152/82 | HR 71 | Temp 97.4°F | Resp 17 | Ht 68.5 in | Wt 195.6 lb

## 2013-11-05 DIAGNOSIS — C3430 Malignant neoplasm of lower lobe, unspecified bronchus or lung: Secondary | ICD-10-CM

## 2013-11-05 DIAGNOSIS — C3490 Malignant neoplasm of unspecified part of unspecified bronchus or lung: Secondary | ICD-10-CM

## 2013-11-05 MED ORDER — DEXAMETHASONE 4 MG PO TABS: ORAL_TABLET | ORAL | Status: DC

## 2013-11-05 MED ORDER — FOLIC ACID 1 MG PO TABS: 1.0000 mg | ORAL_TABLET | Freq: Every day | ORAL | Status: DC

## 2013-11-05 MED ORDER — CYANOCOBALAMIN 1000 MCG/ML IJ SOLN
1000.0000 ug | Freq: Once | INTRAMUSCULAR | Status: AC
  Administered 2013-11-05: 1000 ug via INTRAMUSCULAR

## 2013-11-05 MED ORDER — CYANOCOBALAMIN 1000 MCG/ML IJ SOLN
INTRAMUSCULAR | Status: AC
  Filled 2013-11-05: qty 1

## 2013-11-05 NOTE — Telephone Encounter (Signed)
gv pt appt schedule for june thru aug

## 2013-11-05 NOTE — Progress Notes (Signed)
[facility] Telephone:([phone]   Fax:(336) [phone]  OFFICE PROGRESS NOTE  [name], MD [place] [address]  DIAGNOSIS: Stage IIIB (T2a., N3, M0) non-small cell lung cancer consistent with invasive adenocarcinoma with negative ALK gene translocation and negative EGFR mutation diagnosed in October of 2013   PRIOR THERAPY:  1) Concurrent chemoradiation with weekly carboplatin for AUC of 2 and paclitaxel 45 mg/M2, last dose was given 06/11/2012 with partial response.  2) Consolidation chemotherapy with carboplatin for AUC of 5 and Alimta 500 mg/M2 every 3 weeks. Status post 3 cycles last dose was given 09/12/2012 with stable disease.   CURRENT THERAPY: Systemic chemotherapy again with carboplatin for AUC of 5 and Alimta 500 mg/M2 every 3 weeks. First cycle expected on 11/13/2013.  CHEMOTHERAPY INTENT: Palliative  CURRENT # OF CHEMOTHERAPY CYCLES: 1  CURRENT ANTIEMETICS: N/A  CURRENT SMOKING STATUS: Former Smoker  ORAL CHEMOTHERAPY AND CONSENT: None  CURRENT BISPHOSPHONATES USE: None  PAIN MANAGEMENT: 0/10  NARCOTICS INDUCED CONSTIPATION: None  LIVING WILL AND CODE STATUS: no code Blue  INTERVAL HISTORY: JOSHOA SHAWLER 79 y.o. male returns to the clinic today for three-month follow up visit accompanied by his wife. He has been observation for the last 3 months. The patient has no complaints today. He denied having any significant chest pain but continues to have shortness of breath with exertion,and dry cough or hemoptysis. He denied having any significant weight loss or night sweats. The patient has no nausea or vomiting. He has recent CT scan of the chest performed and he is here for evaluation and discussion of his scan results.  MEDICAL HISTORY: Past Medical History  Diagnosis Date  . Hypertension   . History of radiation therapy 05/02/12-06/19/12    left lung mediastinal lymph nodes 66Gy/65fx  . Lung cancer  04/12/2012    ALLERGIES:  has No Known Allergies.  MEDICATIONS:  Current Outpatient Prescriptions  Medication Sig Dispense Refill  . guaiFENesin (MUCINEX) 600 MG 12 hr tablet Take 1,200 mg by mouth 2 (two) times daily.      Marland Kitchen triamterene-hydrochlorothiazide (MAXZIDE) 75-50 MG per tablet Take 1 tablet by mouth daily.      Marland Kitchen PNEUMOVAX 23 25 MCG/0.5ML injection        No current facility-administered medications for this visit.    REVIEW OF SYSTEMS:  Constitutional: positive for fatigue Eyes: negative Ears, nose, mouth, throat, and face: negative Respiratory: positive for dyspnea on exertion Cardiovascular: negative Gastrointestinal: negative Genitourinary:negative Integument/breast: negative Hematologic/lymphatic: negative Musculoskeletal:negative Neurological: negative Behavioral/Psych: negative Endocrine: negative Allergic/Immunologic: negative   PHYSICAL EXAMINATION: General appearance: alert, cooperative and no distress Head: Normocephalic, without obvious abnormality, atraumatic Neck: no adenopathy Lymph nodes: Cervical, supraclavicular, and axillary nodes normal. Resp: clear to auscultation bilaterally Cardio: regular rate and rhythm, S1, S2 normal, no murmur, click, rub or gallop GI: soft, non-tender; bowel sounds normal; no masses,  no organomegaly Extremities: extremities normal, atraumatic, no cyanosis or edema  ECOG PERFORMANCE STATUS: 1 - Symptomatic but completely ambulatory  Blood pressure 152/82, pulse 71, temperature 97.4 F (36.3 C), temperature source Oral, resp. rate 17, height 5' 8.5" (1.74 m), weight 195 lb 9.6 oz (88.724 kg), SpO2 98.00%.  LABORATORY DATA: Lab Results  Component Value Date   WBC 7.5 10/29/2013   HGB 14.7 10/29/2013   HCT 43.9 10/29/2013   MCV 94.4 10/29/2013   PLT 179 10/29/2013      Chemistry      Component Value Date/Time  NA 141 10/29/2013 0931   K 4.0 10/29/2013 0931   CL 106 10/08/2012 1028   CO2 24 10/29/2013 0931   BUN 25.7  10/29/2013 0931   CREATININE 1.6* 10/29/2013 0931      Component Value Date/Time   CALCIUM 9.6 10/29/2013 0931   ALKPHOS 109 10/29/2013 0931   AST 17 10/29/2013 0931   ALT 12 10/29/2013 0931   BILITOT 0.52 10/29/2013 0931       RADIOGRAPHIC STUDIES: Ct Chest W Contrast  10/29/2013   CLINICAL DATA:  Restaging lung cancer. Chemotherapy and radiation therapy complete.  EXAM: CT CHEST WITH CONTRAST  TECHNIQUE: Multidetector CT imaging of the chest was performed during intravenous contrast administration.  CONTRAST:  64mL OMNIPAQUE IOHEXOL 300 MG/ML  SOLN  COMPARISON:  CT 07/29/2013  FINDINGS: No axillary or supraclavicular lymphadenopathy. There is an ill-defined tissue in the right lower paratracheal mediastinum similar prior. There is ill-defined tissue planes surrounding the right hilum not changed. Small mediastinal nodule along the ascending aorta (image 24, series 2) is also unchanged.  In the right lower lobe there is a nodule with peripheral ground-glass opacity measuring 2.5 x 2.2 cm (image 27, series 5) increased from 15 mm x 14 mm. More inferiorly within the right lower lobe 22 x 29 mm nodule with peripheral ground-glass opacity is increased from 16 x 13 mm on prior (image 31, series 5). 10 mm nodule at the left lung base (image 44) is not changed from 9 mm on prior. Peripheral of 9 mm nodule at the left lung base may be slightly increased from 6 mm on prior (image 45 series 5).  Limited view of the upper abdomen demonstrates normal adrenal glands. Two low-density lesions within the liver are unchanged likely represent benign cysts.  IMPRESSION: 1. Two right lower nodules have increased in size and have an infiltrative appearance and are concerning for adenocarcinoma. 2. Stable perihilar thickening on the right which extend to the mediastinum. 3. Stable small pleural nodule anterior to the ascending aorta. 4. Nodules at the left lung base are not significantly changed. Recommend attention on follow-up.    Electronically Signed   By: Suzy Bouchard M.D.   On: 10/29/2013 14:28   ASSESSMENT AND PLAN: this is a very pleasant 79 years old white male with history of stage IIIB non-small cell lung cancer status post concurrent chemoradiation followed by consolidation chemotherapy and has been observation. His recent scan showed evidence for disease progression with increase in the size of the right lower lobe pulmonary nodules. I discussed the scan results and showed the images to the patient and his wife. I recommended for him to consider resuming systemic chemotherapy with carboplatin and Alimta but I also gave him the option of continuous observation. The patient would like to proceed with the systemic chemotherapy. I discussed with him the adverse effect of this treatment including but not limited to alopecia, myelosuppression, nausea and vomiting, peripheral neuropathy, liver or renal dysfunction. He is expected to start the first cycle of this treatment on 11/13/2013. The patient will receive vitamin B12 injection today. I will Escribe folic acid 1 mg by mouth daily in addition to Decadron 4 mg by mouth twice a day the day before, day of and day after the chemotherapy. He would come back for followup visit in 4 weeks with the start of cycle #2. He was advised to call immediately if he has any concerning symptoms in the interval. The patient voices understanding of current disease status and  treatment options and is in agreement with the current care plan.  All questions were answered. The patient knows to call the clinic with any problems, questions or concerns. We can certainly see the patient much sooner if necessary.  Disclaimer: This note was dictated with voice recognition software. Similar sounding words can inadvertently be transcribed and may not be corrected upon review.

## 2013-11-07 ENCOUNTER — Encounter: Payer: Self-pay | Admitting: Oncology

## 2013-11-07 NOTE — Progress Notes (Signed)
Insurance is paying neulasta 100%

## 2013-11-12 ENCOUNTER — Other Ambulatory Visit (HOSPITAL_BASED_OUTPATIENT_CLINIC_OR_DEPARTMENT_OTHER): Payer: Medicare Other

## 2013-11-12 ENCOUNTER — Ambulatory Visit (HOSPITAL_BASED_OUTPATIENT_CLINIC_OR_DEPARTMENT_OTHER): Payer: Medicare Other

## 2013-11-12 VITALS — BP 143/85 | HR 89 | Temp 97.5°F | Resp 18

## 2013-11-12 DIAGNOSIS — C3490 Malignant neoplasm of unspecified part of unspecified bronchus or lung: Secondary | ICD-10-CM

## 2013-11-12 DIAGNOSIS — C3430 Malignant neoplasm of lower lobe, unspecified bronchus or lung: Secondary | ICD-10-CM

## 2013-11-12 DIAGNOSIS — Z5111 Encounter for antineoplastic chemotherapy: Secondary | ICD-10-CM

## 2013-11-12 LAB — CBC WITH DIFFERENTIAL/PLATELET
BASO%: 0 % (ref 0.0–2.0)
BASOS ABS: 0 10*3/uL (ref 0.0–0.1)
EOS%: 0.1 % (ref 0.0–7.0)
Eosinophils Absolute: 0 10*3/uL (ref 0.0–0.5)
HEMATOCRIT: 38.7 % (ref 38.4–49.9)
HEMOGLOBIN: 13.2 g/dL (ref 13.0–17.1)
LYMPH#: 0.7 10*3/uL — AB (ref 0.9–3.3)
LYMPH%: 5.8 % — ABNORMAL LOW (ref 14.0–49.0)
MCH: 31.6 pg (ref 27.2–33.4)
MCHC: 34.1 g/dL (ref 32.0–36.0)
MCV: 92.6 fL (ref 79.3–98.0)
MONO#: 0.5 10*3/uL (ref 0.1–0.9)
MONO%: 4.4 % (ref 0.0–14.0)
NEUT#: 11 10*3/uL — ABNORMAL HIGH (ref 1.5–6.5)
NEUT%: 89.7 % — ABNORMAL HIGH (ref 39.0–75.0)
PLATELETS: 192 10*3/uL (ref 140–400)
RBC: 4.18 10*6/uL — ABNORMAL LOW (ref 4.20–5.82)
RDW: 13.8 % (ref 11.0–14.6)
WBC: 12.3 10*3/uL — ABNORMAL HIGH (ref 4.0–10.3)

## 2013-11-12 LAB — COMPREHENSIVE METABOLIC PANEL (CC13)
ALT: 14 U/L (ref 0–55)
AST: 18 U/L (ref 5–34)
Albumin: 3.9 g/dL (ref 3.5–5.0)
Alkaline Phosphatase: 104 U/L (ref 40–150)
Anion Gap: 11 mEq/L (ref 3–11)
BILIRUBIN TOTAL: 0.46 mg/dL (ref 0.20–1.20)
BUN: 24.6 mg/dL (ref 7.0–26.0)
CALCIUM: 9.6 mg/dL (ref 8.4–10.4)
CHLORIDE: 104 meq/L (ref 98–109)
CO2: 24 meq/L (ref 22–29)
CREATININE: 1.5 mg/dL — AB (ref 0.7–1.3)
Glucose: 118 mg/dl (ref 70–140)
Potassium: 3.9 mEq/L (ref 3.5–5.1)
Sodium: 139 mEq/L (ref 136–145)
Total Protein: 7.4 g/dL (ref 6.4–8.3)

## 2013-11-12 MED ORDER — ONDANSETRON 16 MG/50ML IVPB (CHCC)
INTRAVENOUS | Status: AC
  Filled 2013-11-12: qty 16

## 2013-11-12 MED ORDER — SODIUM CHLORIDE 0.9 % IV SOLN
500.0000 mg/m2 | Freq: Once | INTRAVENOUS | Status: AC
  Administered 2013-11-12: 1000 mg via INTRAVENOUS
  Filled 2013-11-12: qty 40

## 2013-11-12 MED ORDER — SODIUM CHLORIDE 0.9 % IV SOLN: 500.0000 mg/m2 | Freq: Once | INTRAVENOUS | Status: DC

## 2013-11-12 MED ORDER — ONDANSETRON 16 MG/50ML IVPB (CHCC)
16.0000 mg | Freq: Once | INTRAVENOUS | Status: AC
  Administered 2013-11-12: 16 mg via INTRAVENOUS

## 2013-11-12 MED ORDER — DEXAMETHASONE SODIUM PHOSPHATE 20 MG/5ML IJ SOLN
20.0000 mg | Freq: Once | INTRAMUSCULAR | Status: AC
  Administered 2013-11-12: 20 mg via INTRAVENOUS

## 2013-11-12 MED ORDER — SODIUM CHLORIDE 0.9 % IV SOLN
Freq: Once | INTRAVENOUS | Status: AC
  Administered 2013-11-12: 14:00:00 via INTRAVENOUS

## 2013-11-12 MED ORDER — SODIUM CHLORIDE 0.9 % IV SOLN
371.5000 mg | Freq: Once | INTRAVENOUS | Status: AC
  Administered 2013-11-12: 370 mg via INTRAVENOUS
  Filled 2013-11-12: qty 37

## 2013-11-12 MED ORDER — CARBOPLATIN CHEMO INTRADERMAL TEST DOSE 100MCG/0.02ML
100.0000 ug | Freq: Once | INTRADERMAL | Status: AC
  Administered 2013-11-12: 100 ug via INTRADERMAL
  Filled 2013-11-12: qty 0.01

## 2013-11-12 MED ORDER — DEXAMETHASONE SODIUM PHOSPHATE 20 MG/5ML IJ SOLN
INTRAMUSCULAR | Status: AC
  Filled 2013-11-12: qty 5

## 2013-11-12 MED ORDER — SODIUM CHLORIDE 0.9 % IV SOLN: 1100.0000 mg | Freq: Once | INTRAVENOUS | Status: DC

## 2013-11-12 NOTE — Patient Instructions (Addendum)
[address] Discharge Instructions for Patients Receiving Chemotherapy  Today you received the following chemotherapy agents: Alimta, Carboplatin  To help prevent nausea and vomiting after your treatment, we encourage you to take your nausea medication as prescribed by your physician.    If you develop nausea and vomiting that is not controlled by your nausea medication, call the clinic.   BELOW ARE SYMPTOMS THAT SHOULD BE REPORTED IMMEDIATELY:  *FEVER GREATER THAN 100.5 F  *CHILLS WITH OR WITHOUT FEVER  NAUSEA AND VOMITING THAT IS NOT CONTROLLED WITH YOUR NAUSEA MEDICATION  *UNUSUAL SHORTNESS OF BREATH  *UNUSUAL BRUISING OR BLEEDING  TENDERNESS IN MOUTH AND THROAT WITH OR WITHOUT PRESENCE OF ULCERS  *URINARY PROBLEMS  *BOWEL PROBLEMS  UNUSUAL RASH Items with * indicate a potential emergency and should be followed up as soon as possible.  Feel free to call the clinic you have any questions or concerns. The clinic phone number is [phone].   Pemetrexed injection (Alimta) What is this medicine? PEMETREXED (PEM e TREX ed) is a chemotherapy drug. This medicine affects cells that are rapidly growing, such as cancer cells and cells in your mouth and stomach. It is usually used to treat lung cancers like non-small cell lung cancer and mesothelioma. It may also be used to treat other cancers. This medicine may be used for other purposes; ask your health care Ronella Plunk or pharmacist if you have questions. COMMON BRAND NAME(S): Alimta What should I tell my health care Hafsa Lohn before I take this medicine? They need to know if you have any of these conditions: -if you frequently drink alcohol containing beverages -infection (especially a virus infection such as chickenpox, cold sores, or herpes) -kidney disease -liver disease -low blood counts, like low platelets, red bloods, or white blood cells -an unusual or allergic reaction to pemetrexed, mannitol, other  medicines, foods, dyes, or preservatives -pregnant or trying to get pregnant -breast-feeding How should I use this medicine? This drug is given as an infusion into a vein. It is administered in a hospital or clinic by a specially trained health care professional. Talk to your pediatrician regarding the use of this medicine in children. Special care may be needed. Overdosage: If you think you have taken too much of this medicine contact a poison control center or emergency room at once. NOTE: This medicine is only for you. Do not share this medicine with others. What if I miss a dose? It is important not to miss your dose. Call your doctor or health care professional if you are unable to keep an appointment. What may interact with this medicine? -aspirin and aspirin-like medicines -medicines to increase blood counts like filgrastim, pegfilgrastim, sargramostim -methotrexate -NSAIDS, medicines for pain and inflammation, like ibuprofen or naproxen -probenecid -pyrimethamine -vaccines Talk to your doctor or health care professional before taking any of these medicines: -acetaminophen -aspirin -ibuprofen -ketoprofen -naproxen This list may not describe all possible interactions. Give your health care Coalton Arch a list of all the medicines, herbs, non-prescription drugs, or dietary supplements you use. Also tell them if you smoke, drink alcohol, or use illegal drugs. Some items may interact with your medicine. What should I watch for while using this medicine? Visit your doctor for checks on your progress. This drug may make you feel generally unwell. This is not uncommon, as chemotherapy can affect healthy cells as well as cancer cells. Report any side effects. Continue your course of treatment even though you feel ill unless your doctor tells you to  stop. In some cases, you may be given additional medicines to help with side effects. Follow all directions for their use. Call your doctor or  health care professional for advice if you get a fever, chills or sore throat, or other symptoms of a cold or flu. Do not treat yourself. This drug decreases your body's ability to fight infections. Try to avoid being around people who are sick. This medicine may increase your risk to bruise or bleed. Call your doctor or health care professional if you notice any unusual bleeding. Be careful brushing and flossing your teeth or using a toothpick because you may get an infection or bleed more easily. If you have any dental work done, tell your dentist you are receiving this medicine. Avoid taking products that contain aspirin, acetaminophen, ibuprofen, naproxen, or ketoprofen unless instructed by your doctor. These medicines may hide a fever. Call your doctor or health care professional if you get diarrhea or mouth sores. Do not treat yourself. To protect your kidneys, drink water or other fluids as directed while you are taking this medicine. Men and women must use effective birth control while taking this medicine. You may also need to continue using effective birth control for a time after stopping this medicine. Do not become pregnant while taking this medicine. Tell your doctor right away if you think that you or your partner might be pregnant. There is a potential for serious side effects to an unborn child. Talk to your health care professional or pharmacist for more information. Do not breast-feed an infant while taking this medicine. This medicine may lower sperm counts. What side effects may I notice from receiving this medicine? Side effects that you should report to your doctor or health care professional as soon as possible: -allergic reactions like skin rash, itching or hives, swelling of the face, lips, or tongue -low blood counts - this medicine may decrease the number of white blood cells, red blood cells and platelets. You may be at increased risk for infections and bleeding. -signs of  infection - fever or chills, cough, sore throat, pain or difficulty passing urine -signs of decreased platelets or bleeding - bruising, pinpoint red spots on the skin, black, tarry stools, blood in the urine -signs of decreased red blood cells - unusually weak or tired, fainting spells, lightheadedness -breathing problems, like a dry cough -changes in emotions or moods -chest pain -confusion -diarrhea -high blood pressure -mouth or throat sores or ulcers -pain, swelling, warmth in the leg -pain on swallowing -swelling of the ankles, feet, hands -trouble passing urine or change in the amount of urine -vomiting -yellowing of the eyes or skin Side effects that usually do not require medical attention (report to your doctor or health care professional if they continue or are bothersome): -hair loss -loss of appetite -nausea -stomach upset This list may not describe all possible side effects. Call your doctor for medical advice about side effects. You may report side effects to FDA at 1-800-FDA-1088. Where should I keep my medicine? This drug is given in a hospital or clinic and will not be stored at home. NOTE: This sheet is a summary. It may not cover all possible information. If you have questions about this medicine, talk to your doctor, pharmacist, or health care Sanuel Ladnier.  2014, Elsevier/Gold Standard. (2007-12-18 13:24:03)   Carboplatin injection What is this medicine? CARBOPLATIN (KAR boe pla tin) is a chemotherapy drug. It targets fast dividing cells, like cancer cells, and causes these cells to  die. This medicine is used to treat ovarian cancer and many other cancers. This medicine may be used for other purposes; ask your health care Odile Veloso or pharmacist if you have questions. COMMON BRAND NAME(S): Paraplatin What should I tell my health care Ailene Royal before I take this medicine? They need to know if you have any of these conditions: -blood disorders -hearing  problems -kidney disease -recent or ongoing radiation therapy -an unusual or allergic reaction to carboplatin, cisplatin, other chemotherapy, other medicines, foods, dyes, or preservatives -pregnant or trying to get pregnant -breast-feeding How should I use this medicine? This drug is usually given as an infusion into a vein. It is administered in a hospital or clinic by a specially trained health care professional. Talk to your pediatrician regarding the use of this medicine in children. Special care may be needed. Overdosage: If you think you have taken too much of this medicine contact a poison control center or emergency room at once. NOTE: This medicine is only for you. Do not share this medicine with others. What if I miss a dose? It is important not to miss a dose. Call your doctor or health care professional if you are unable to keep an appointment. What may interact with this medicine? -medicines for seizures -medicines to increase blood counts like filgrastim, pegfilgrastim, sargramostim -some antibiotics like amikacin, gentamicin, neomycin, streptomycin, tobramycin -vaccines Talk to your doctor or health care professional before taking any of these medicines: -acetaminophen -aspirin -ibuprofen -ketoprofen -naproxen This list may not describe all possible interactions. Give your health care Amarachi Kotz a list of all the medicines, herbs, non-prescription drugs, or dietary supplements you use. Also tell them if you smoke, drink alcohol, or use illegal drugs. Some items may interact with your medicine. What should I watch for while using this medicine? Your condition will be monitored carefully while you are receiving this medicine. You will need important blood work done while you are taking this medicine. This drug may make you feel generally unwell. This is not uncommon, as chemotherapy can affect healthy cells as well as cancer cells. Report any side effects. Continue your course  of treatment even though you feel ill unless your doctor tells you to stop. In some cases, you may be given additional medicines to help with side effects. Follow all directions for their use. Call your doctor or health care professional for advice if you get a fever, chills or sore throat, or other symptoms of a cold or flu. Do not treat yourself. This drug decreases your body's ability to fight infections. Try to avoid being around people who are sick. This medicine may increase your risk to bruise or bleed. Call your doctor or health care professional if you notice any unusual bleeding. Be careful brushing and flossing your teeth or using a toothpick because you may get an infection or bleed more easily. If you have any dental work done, tell your dentist you are receiving this medicine. Avoid taking products that contain aspirin, acetaminophen, ibuprofen, naproxen, or ketoprofen unless instructed by your doctor. These medicines may hide a fever. Do not become pregnant while taking this medicine. Women should inform their doctor if they wish to become pregnant or think they might be pregnant. There is a potential for serious side effects to an unborn child. Talk to your health care professional or pharmacist for more information. Do not breast-feed an infant while taking this medicine. What side effects may I notice from receiving this medicine? Side effects that you  should report to your doctor or health care professional as soon as possible: -allergic reactions like skin rash, itching or hives, swelling of the face, lips, or tongue -signs of infection - fever or chills, cough, sore throat, pain or difficulty passing urine -signs of decreased platelets or bleeding - bruising, pinpoint red spots on the skin, black, tarry stools, nosebleeds -signs of decreased red blood cells - unusually weak or tired, fainting spells, lightheadedness -breathing problems -changes in hearing -changes in  vision -chest pain -high blood pressure -low blood counts - This drug may decrease the number of white blood cells, red blood cells and platelets. You may be at increased risk for infections and bleeding. -nausea and vomiting -pain, swelling, redness or irritation at the injection site -pain, tingling, numbness in the hands or feet -problems with balance, talking, walking -trouble passing urine or change in the amount of urine Side effects that usually do not require medical attention (report to your doctor or health care professional if they continue or are bothersome): -hair loss -loss of appetite -metallic taste in the mouth or changes in taste This list may not describe all possible side effects. Call your doctor for medical advice about side effects. You may report side effects to FDA at 1-800-FDA-1088. Where should I keep my medicine? This drug is given in a hospital or clinic and will not be stored at home. NOTE: This sheet is a summary. It may not cover all possible information. If you have questions about this medicine, talk to your doctor, pharmacist, or health care Adiah Guereca.  2014, Elsevier/Gold Standard. (2007-08-21 14:38:05)

## 2013-11-19 ENCOUNTER — Other Ambulatory Visit (HOSPITAL_BASED_OUTPATIENT_CLINIC_OR_DEPARTMENT_OTHER): Payer: Medicare Other

## 2013-11-19 DIAGNOSIS — C3490 Malignant neoplasm of unspecified part of unspecified bronchus or lung: Secondary | ICD-10-CM

## 2013-11-19 DIAGNOSIS — C3430 Malignant neoplasm of lower lobe, unspecified bronchus or lung: Secondary | ICD-10-CM

## 2013-11-19 LAB — COMPREHENSIVE METABOLIC PANEL (CC13)
ALBUMIN: 3.4 g/dL — AB (ref 3.5–5.0)
ALT: 16 U/L (ref 0–55)
AST: 18 U/L (ref 5–34)
Alkaline Phosphatase: 104 U/L (ref 40–150)
Anion Gap: 9 mEq/L (ref 3–11)
BUN: 35.2 mg/dL — AB (ref 7.0–26.0)
CALCIUM: 8.9 mg/dL (ref 8.4–10.4)
CHLORIDE: 101 meq/L (ref 98–109)
CO2: 27 meq/L (ref 22–29)
Creatinine: 1.9 mg/dL — ABNORMAL HIGH (ref 0.7–1.3)
Glucose: 92 mg/dl (ref 70–140)
POTASSIUM: 3.4 meq/L — AB (ref 3.5–5.1)
Sodium: 137 mEq/L (ref 136–145)
Total Bilirubin: 0.86 mg/dL (ref 0.20–1.20)
Total Protein: 6.6 g/dL (ref 6.4–8.3)

## 2013-11-19 LAB — CBC WITH DIFFERENTIAL/PLATELET
BASO%: 0.5 % (ref 0.0–2.0)
Basophils Absolute: 0 10*3/uL (ref 0.0–0.1)
EOS%: 4.6 % (ref 0.0–7.0)
Eosinophils Absolute: 0.2 10*3/uL (ref 0.0–0.5)
HCT: 39 % (ref 38.4–49.9)
HGB: 13.1 g/dL (ref 13.0–17.1)
LYMPH%: 16 % (ref 14.0–49.0)
MCH: 31.4 pg (ref 27.2–33.4)
MCHC: 33.5 g/dL (ref 32.0–36.0)
MCV: 93.8 fL (ref 79.3–98.0)
MONO#: 0.1 10*3/uL (ref 0.1–0.9)
MONO%: 2.7 % (ref 0.0–14.0)
NEUT%: 76.2 % — ABNORMAL HIGH (ref 39.0–75.0)
NEUTROS ABS: 3 10*3/uL (ref 1.5–6.5)
Platelets: 131 10*3/uL — ABNORMAL LOW (ref 140–400)
RBC: 4.16 10*6/uL — ABNORMAL LOW (ref 4.20–5.82)
RDW: 13.6 % (ref 11.0–14.6)
WBC: 4 10*3/uL (ref 4.0–10.3)
lymph#: 0.6 10*3/uL — ABNORMAL LOW (ref 0.9–3.3)

## 2013-11-26 ENCOUNTER — Other Ambulatory Visit: Payer: Self-pay | Admitting: *Deleted

## 2013-11-26 ENCOUNTER — Other Ambulatory Visit (HOSPITAL_BASED_OUTPATIENT_CLINIC_OR_DEPARTMENT_OTHER): Payer: Medicare Other

## 2013-11-26 ENCOUNTER — Telehealth: Payer: Self-pay | Admitting: *Deleted

## 2013-11-26 DIAGNOSIS — C3490 Malignant neoplasm of unspecified part of unspecified bronchus or lung: Secondary | ICD-10-CM

## 2013-11-26 LAB — CBC WITH DIFFERENTIAL/PLATELET
BASO%: 0.3 % (ref 0.0–2.0)
Basophils Absolute: 0 10*3/uL (ref 0.0–0.1)
EOS ABS: 0.3 10*3/uL (ref 0.0–0.5)
EOS%: 8 % — AB (ref 0.0–7.0)
HCT: 33.6 % — ABNORMAL LOW (ref 38.4–49.9)
HGB: 11.3 g/dL — ABNORMAL LOW (ref 13.0–17.1)
LYMPH%: 21 % (ref 14.0–49.0)
MCH: 31.4 pg (ref 27.2–33.4)
MCHC: 33.5 g/dL (ref 32.0–36.0)
MCV: 93.8 fL (ref 79.3–98.0)
MONO#: 0.4 10*3/uL (ref 0.1–0.9)
MONO%: 9.1 % (ref 0.0–14.0)
NEUT#: 2.5 10*3/uL (ref 1.5–6.5)
NEUT%: 61.6 % (ref 39.0–75.0)
Platelets: 47 10*3/uL — ABNORMAL LOW (ref 140–400)
RBC: 3.58 10*6/uL — AB (ref 4.20–5.82)
RDW: 13.4 % (ref 11.0–14.6)
WBC: 4.1 10*3/uL (ref 4.0–10.3)
lymph#: 0.9 10*3/uL (ref 0.9–3.3)

## 2013-11-26 LAB — COMPREHENSIVE METABOLIC PANEL (CC13)
ALBUMIN: 3.3 g/dL — AB (ref 3.5–5.0)
ALT: 47 U/L (ref 0–55)
AST: 29 U/L (ref 5–34)
Alkaline Phosphatase: 93 U/L (ref 40–150)
Anion Gap: 10 mEq/L (ref 3–11)
BUN: 20.2 mg/dL (ref 7.0–26.0)
CO2: 27 mEq/L (ref 22–29)
Calcium: 8.8 mg/dL (ref 8.4–10.4)
Chloride: 103 mEq/L (ref 98–109)
Creatinine: 1.3 mg/dL (ref 0.7–1.3)
GLUCOSE: 87 mg/dL (ref 70–140)
POTASSIUM: 3 meq/L — AB (ref 3.5–5.1)
SODIUM: 139 meq/L (ref 136–145)
Total Bilirubin: 0.31 mg/dL (ref 0.20–1.20)
Total Protein: 6.6 g/dL (ref 6.4–8.3)

## 2013-11-26 MED ORDER — POTASSIUM CHLORIDE CRYS ER 20 MEQ PO TBCR: EXTENDED_RELEASE_TABLET | ORAL | Status: DC

## 2013-11-26 NOTE — Telephone Encounter (Signed)
Left vm regarding rx for K-dur 20 meq 1 tablet by mouth for 7 days only and that it was called in to his pharmacy. [phone] and Rn's name given for questions.

## 2013-12-03 ENCOUNTER — Telehealth: Payer: Self-pay | Admitting: *Deleted

## 2013-12-03 ENCOUNTER — Other Ambulatory Visit (HOSPITAL_BASED_OUTPATIENT_CLINIC_OR_DEPARTMENT_OTHER): Payer: Medicare Other

## 2013-12-03 ENCOUNTER — Telehealth: Payer: Self-pay | Admitting: Internal Medicine

## 2013-12-03 ENCOUNTER — Ambulatory Visit (HOSPITAL_BASED_OUTPATIENT_CLINIC_OR_DEPARTMENT_OTHER): Payer: Medicare Other | Admitting: Internal Medicine

## 2013-12-03 ENCOUNTER — Encounter: Payer: Self-pay | Admitting: Internal Medicine

## 2013-12-03 ENCOUNTER — Ambulatory Visit (HOSPITAL_BASED_OUTPATIENT_CLINIC_OR_DEPARTMENT_OTHER): Payer: Medicare Other

## 2013-12-03 VITALS — BP 167/94 | HR 109 | Temp 97.0°F | Resp 18 | Ht 68.5 in | Wt 198.8 lb

## 2013-12-03 VITALS — BP 119/74 | HR 102

## 2013-12-03 DIAGNOSIS — R5381 Other malaise: Secondary | ICD-10-CM

## 2013-12-03 DIAGNOSIS — Z5111 Encounter for antineoplastic chemotherapy: Secondary | ICD-10-CM

## 2013-12-03 DIAGNOSIS — C3430 Malignant neoplasm of lower lobe, unspecified bronchus or lung: Secondary | ICD-10-CM

## 2013-12-03 DIAGNOSIS — R05 Cough: Secondary | ICD-10-CM

## 2013-12-03 DIAGNOSIS — C3490 Malignant neoplasm of unspecified part of unspecified bronchus or lung: Secondary | ICD-10-CM

## 2013-12-03 DIAGNOSIS — C3491 Malignant neoplasm of unspecified part of right bronchus or lung: Secondary | ICD-10-CM

## 2013-12-03 DIAGNOSIS — R0989 Other specified symptoms and signs involving the circulatory and respiratory systems: Secondary | ICD-10-CM

## 2013-12-03 DIAGNOSIS — R5383 Other fatigue: Secondary | ICD-10-CM

## 2013-12-03 DIAGNOSIS — R0609 Other forms of dyspnea: Secondary | ICD-10-CM

## 2013-12-03 DIAGNOSIS — Z87891 Personal history of nicotine dependence: Secondary | ICD-10-CM

## 2013-12-03 DIAGNOSIS — R059 Cough, unspecified: Secondary | ICD-10-CM

## 2013-12-03 LAB — COMPREHENSIVE METABOLIC PANEL (CC13)
ALBUMIN: 3.5 g/dL (ref 3.5–5.0)
ALK PHOS: 121 U/L (ref 40–150)
ALT: 27 U/L (ref 0–55)
AST: 18 U/L (ref 5–34)
Anion Gap: 11 mEq/L (ref 3–11)
BUN: 19.9 mg/dL (ref 7.0–26.0)
CO2: 22 mEq/L (ref 22–29)
Calcium: 9.5 mg/dL (ref 8.4–10.4)
Chloride: 105 mEq/L (ref 98–109)
Creatinine: 1.4 mg/dL — ABNORMAL HIGH (ref 0.7–1.3)
Glucose: 124 mg/dl (ref 70–140)
Potassium: 3.7 mEq/L (ref 3.5–5.1)
SODIUM: 139 meq/L (ref 136–145)
Total Bilirubin: 0.31 mg/dL (ref 0.20–1.20)
Total Protein: 7.5 g/dL (ref 6.4–8.3)

## 2013-12-03 LAB — CBC WITH DIFFERENTIAL/PLATELET
BASO%: 0.2 % (ref 0.0–2.0)
Basophils Absolute: 0 10*3/uL (ref 0.0–0.1)
EOS%: 0.1 % (ref 0.0–7.0)
Eosinophils Absolute: 0 10*3/uL (ref 0.0–0.5)
HCT: 33.6 % — ABNORMAL LOW (ref 38.4–49.9)
HEMOGLOBIN: 11.2 g/dL — AB (ref 13.0–17.1)
LYMPH%: 9.1 % — ABNORMAL LOW (ref 14.0–49.0)
MCH: 31.5 pg (ref 27.2–33.4)
MCHC: 33.4 g/dL (ref 32.0–36.0)
MCV: 94.3 fL (ref 79.3–98.0)
MONO#: 0.5 10*3/uL (ref 0.1–0.9)
MONO%: 7.5 % (ref 0.0–14.0)
NEUT#: 5 10*3/uL (ref 1.5–6.5)
NEUT%: 83.1 % — ABNORMAL HIGH (ref 39.0–75.0)
Platelets: 292 10*3/uL (ref 140–400)
RBC: 3.57 10*6/uL — ABNORMAL LOW (ref 4.20–5.82)
RDW: 13.9 % (ref 11.0–14.6)
WBC: 6 10*3/uL (ref 4.0–10.3)
lymph#: 0.6 10*3/uL — ABNORMAL LOW (ref 0.9–3.3)

## 2013-12-03 MED ORDER — SODIUM CHLORIDE 0.9 % IV SOLN
480.0000 mg/m2 | Freq: Once | INTRAVENOUS | Status: AC
  Administered 2013-12-03: 1000 mg via INTRAVENOUS
  Filled 2013-12-03: qty 40

## 2013-12-03 MED ORDER — SODIUM CHLORIDE 0.9 % IV SOLN
Freq: Once | INTRAVENOUS | Status: AC
  Administered 2013-12-03: 11:00:00 via INTRAVENOUS

## 2013-12-03 MED ORDER — CARBOPLATIN CHEMO INTRADERMAL TEST DOSE 100MCG/0.02ML
100.0000 ug | Freq: Once | INTRADERMAL | Status: AC
  Administered 2013-12-03: 100 ug via INTRADERMAL
  Filled 2013-12-03: qty 0.01

## 2013-12-03 MED ORDER — ONDANSETRON 16 MG/50ML IVPB (CHCC)
16.0000 mg | Freq: Once | INTRAVENOUS | Status: AC
  Administered 2013-12-03: 16 mg via INTRAVENOUS

## 2013-12-03 MED ORDER — ONDANSETRON 16 MG/50ML IVPB (CHCC)
INTRAVENOUS | Status: AC
  Filled 2013-12-03: qty 16

## 2013-12-03 MED ORDER — DEXAMETHASONE SODIUM PHOSPHATE 20 MG/5ML IJ SOLN
20.0000 mg | Freq: Once | INTRAMUSCULAR | Status: AC
  Administered 2013-12-03: 20 mg via INTRAVENOUS

## 2013-12-03 MED ORDER — DEXAMETHASONE SODIUM PHOSPHATE 20 MG/5ML IJ SOLN
INTRAMUSCULAR | Status: AC
  Filled 2013-12-03: qty 5

## 2013-12-03 NOTE — Progress Notes (Signed)
Per Dr. Julien Nordmann, omit Carbo, give premeds and Alimta only today

## 2013-12-03 NOTE — Telephone Encounter (Signed)
Talked to pt gave him appt for lab,md and chemo for July 2015

## 2013-12-03 NOTE — Patient Instructions (Signed)
[employer] [facility] Discharge Instructions for Patients Receiving Chemotherapy  Today you received the following chemotherapy agents: Alimta  To help prevent nausea and vomiting after your treatment, we encourage you to take your nausea medication as prescribed by your physician.    If you develop nausea and vomiting that is not controlled by your nausea medication, call the clinic.   BELOW ARE SYMPTOMS THAT SHOULD BE REPORTED IMMEDIATELY:  *FEVER GREATER THAN 100.5 F  *CHILLS WITH OR WITHOUT FEVER  NAUSEA AND VOMITING THAT IS NOT CONTROLLED WITH YOUR NAUSEA MEDICATION  *UNUSUAL SHORTNESS OF BREATH  *UNUSUAL BRUISING OR BLEEDING  TENDERNESS IN MOUTH AND THROAT WITH OR WITHOUT PRESENCE OF ULCERS  *URINARY PROBLEMS  *BOWEL PROBLEMS  UNUSUAL RASH Items with * indicate a potential emergency and should be followed up as soon as possible.  Feel free to call the clinic you have any questions or concerns. The clinic phone number is [phone].

## 2013-12-03 NOTE — Progress Notes (Signed)
At 15 min mark of carbo skin test, area where test dose was administered is reddened. Pharmacy agrees to this being a positive reaction. Diane RN with Dr. Julien Nordmann made aware.

## 2013-12-03 NOTE — Progress Notes (Signed)
[place] Telephone:([phone]) [phone]   Fax:([phone]) [phone]  OFFICE PROGRESS NOTE  [name], MD [place] [address]  DIAGNOSIS: Stage IIIB (T2a., N3, M0) non-small cell lung cancer consistent with invasive adenocarcinoma with negative ALK gene translocation and negative EGFR mutation diagnosed in October of 2013   PRIOR THERAPY:  1) Concurrent chemoradiation with weekly carboplatin for AUC of 2 and paclitaxel 45 mg/M2, last dose was given 06/11/2012 with partial response.  2) Consolidation chemotherapy with carboplatin for AUC of 5 and Alimta 500 mg/M2 every 3 weeks. Status post 3 cycles last dose was given 09/12/2012 with stable disease.   CURRENT THERAPY: Systemic chemotherapy again with carboplatin for AUC of 5 and Alimta 500 mg/M2 every 3 weeks. First cycle expected on 11/13/2013. Status post 1 cycle.  CHEMOTHERAPY INTENT: Palliative  CURRENT # OF CHEMOTHERAPY CYCLES: 2  CURRENT ANTIEMETICS: N/A  CURRENT SMOKING STATUS: Former Smoker  ORAL CHEMOTHERAPY AND CONSENT: None  CURRENT BISPHOSPHONATES USE: None  PAIN MANAGEMENT: 0/10  NARCOTICS INDUCED CONSTIPATION: None  LIVING WILL AND CODE STATUS: no code Blue  INTERVAL HISTORY: Logan Russell 79 y.o. male returns to the clinic today for three-month follow up visit accompanied by his wife. He was on systemic chemotherapy with carboplatin and Alimta and tolerated the first cycle of this treatment fairly well except for mild fatigue a few days after the chemotherapy. He denied having any significant chest pain but continues to have shortness of breath with exertion and dry cough with no hemoptysis. He denied having any significant weight loss or night sweats. The patient has no nausea or vomiting. He is here today to start cycle #2.  MEDICAL HISTORY: Past Medical History  Diagnosis Date  . Hypertension   . History of radiation therapy 05/02/12-06/19/12    left lung mediastinal  lymph nodes 66Gy/74fx  . Lung cancer 04/12/2012    ALLERGIES:  has No Known Allergies.  MEDICATIONS:  Current Outpatient Prescriptions  Medication Sig Dispense Refill  . dexamethasone (DECADRON) 4 MG tablet 4 Milligram by mouth twice a day the day before, day of and day after the chemotherapy  40 tablet  1  . folic acid (FOLVITE) 1 MG tablet Take 1 tablet (1 mg total) by mouth daily.  30 tablet  4  . guaiFENesin (MUCINEX) 600 MG 12 hr tablet Take 1,200 mg by mouth 2 (two) times daily.      Marland Kitchen ibuprofen (ADVIL,MOTRIN) 200 MG tablet Take 400 mg by mouth every 6 (six) hours as needed.      . potassium chloride SA (K-DUR,KLOR-CON) 20 MEQ tablet Take 1 tablet by mouth for 7 days  7 tablet  0  . triamterene-hydrochlorothiazide (MAXZIDE) 75-50 MG per tablet Take 1 tablet by mouth daily.      Marland Kitchen PNEUMOVAX 23 25 MCG/0.5ML injection        No current facility-administered medications for this visit.    REVIEW OF SYSTEMS:  Constitutional: positive for fatigue Eyes: negative Ears, nose, mouth, throat, and face: negative Respiratory: positive for dyspnea on exertion Cardiovascular: negative Gastrointestinal: negative Genitourinary:negative Integument/breast: negative Hematologic/lymphatic: negative Musculoskeletal:negative Neurological: negative Behavioral/Psych: negative Endocrine: negative Allergic/Immunologic: negative   PHYSICAL EXAMINATION: General appearance: alert, cooperative and no distress Head: Normocephalic, without obvious abnormality, atraumatic Neck: no adenopathy Lymph nodes: Cervical, supraclavicular, and axillary nodes normal. Resp: clear to auscultation bilaterally Cardio: regular rate and rhythm, S1, S2 normal, no murmur, click, rub or gallop GI: soft, non-tender; bowel sounds normal;  no masses,  no organomegaly Extremities: extremities normal, atraumatic, no cyanosis or edema  ECOG PERFORMANCE STATUS: 1 - Symptomatic but completely ambulatory  Blood pressure  167/94, pulse 109, temperature 97 F (36.1 C), temperature source Oral, resp. rate 18, height 5' 8.5" (1.74 m), weight 198 lb 12.8 oz (90.175 kg).  LABORATORY DATA: Lab Results  Component Value Date   WBC 6.0 12/03/2013   HGB 11.2* 12/03/2013   HCT 33.6* 12/03/2013   MCV 94.3 12/03/2013   PLT 292 12/03/2013      Chemistry      Component Value Date/Time   NA 139 12/03/2013 1008   K 3.7 12/03/2013 1008   CL 106 10/08/2012 1028   CO2 22 12/03/2013 1008   BUN 19.9 12/03/2013 1008   CREATININE 1.4* 12/03/2013 1008      Component Value Date/Time   CALCIUM 9.5 12/03/2013 1008   ALKPHOS 121 12/03/2013 1008   AST 18 12/03/2013 1008   ALT 27 12/03/2013 1008   BILITOT 0.31 12/03/2013 1008       RADIOGRAPHIC STUDIES:  ASSESSMENT AND PLAN: this is a very pleasant 79 years old white male with history of stage IIIB non-small cell lung cancer status post concurrent chemoradiation followed by consolidation chemotherapy and has been observation. The patient is currently on systemic chemotherapy with carboplatin and Alimta status post 1 cycle. He tolerated the first cycle of his treatment fairly well with no significant adverse effects except for mild fatigue. I recommended for him to proceed with cycle #2 today as scheduled. He would come back for followup visit in 3 weeks with the start of cycle #3. He was advised to call immediately if he has any concerning symptoms in the interval. The patient voices understanding of current disease status and treatment options and is in agreement with the current care plan.  All questions were answered. The patient knows to call the clinic with any problems, questions or concerns. We can certainly see the patient much sooner if necessary.  Disclaimer: This note was dictated with voice recognition software. Similar sounding words can inadvertently be transcribed and may not be corrected upon review.

## 2013-12-03 NOTE — Telephone Encounter (Signed)
Gave pt appt for lab and and MD, emailed Sharyn Lull regarding chemo adjustment and chemo

## 2013-12-03 NOTE — Telephone Encounter (Signed)
Per staff phone call and POF I have schedueld appts. Scheduler advised of appts. I also adjusted 7/28 appt JMW

## 2013-12-10 ENCOUNTER — Other Ambulatory Visit (HOSPITAL_BASED_OUTPATIENT_CLINIC_OR_DEPARTMENT_OTHER): Payer: Medicare Other

## 2013-12-10 DIAGNOSIS — C3490 Malignant neoplasm of unspecified part of unspecified bronchus or lung: Secondary | ICD-10-CM

## 2013-12-10 DIAGNOSIS — C3430 Malignant neoplasm of lower lobe, unspecified bronchus or lung: Secondary | ICD-10-CM

## 2013-12-10 LAB — COMPREHENSIVE METABOLIC PANEL (CC13)
ALT: 25 U/L (ref 0–55)
ANION GAP: 9 meq/L (ref 3–11)
AST: 23 U/L (ref 5–34)
Albumin: 3.3 g/dL — ABNORMAL LOW (ref 3.5–5.0)
Alkaline Phosphatase: 112 U/L (ref 40–150)
BILIRUBIN TOTAL: 0.8 mg/dL (ref 0.20–1.20)
BUN: 33.7 mg/dL — ABNORMAL HIGH (ref 7.0–26.0)
CO2: 26 meq/L (ref 22–29)
CREATININE: 1.4 mg/dL — AB (ref 0.7–1.3)
Calcium: 9 mg/dL (ref 8.4–10.4)
Chloride: 100 mEq/L (ref 98–109)
Glucose: 93 mg/dl (ref 70–140)
Potassium: 3.4 mEq/L — ABNORMAL LOW (ref 3.5–5.1)
Sodium: 135 mEq/L — ABNORMAL LOW (ref 136–145)
Total Protein: 6.8 g/dL (ref 6.4–8.3)

## 2013-12-10 LAB — CBC WITH DIFFERENTIAL/PLATELET
BASO%: 0.5 % (ref 0.0–2.0)
Basophils Absolute: 0 10*3/uL (ref 0.0–0.1)
EOS ABS: 0 10*3/uL (ref 0.0–0.5)
EOS%: 0 % (ref 0.0–7.0)
HCT: 31 % — ABNORMAL LOW (ref 38.4–49.9)
HGB: 10.7 g/dL — ABNORMAL LOW (ref 13.0–17.1)
LYMPH%: 33 % (ref 14.0–49.0)
MCH: 31.7 pg (ref 27.2–33.4)
MCHC: 34.5 g/dL (ref 32.0–36.0)
MCV: 91.7 fL (ref 79.3–98.0)
MONO#: 0.1 10*3/uL (ref 0.1–0.9)
MONO%: 4.7 % (ref 0.0–14.0)
NEUT#: 1.2 10*3/uL — ABNORMAL LOW (ref 1.5–6.5)
NEUT%: 61.8 % (ref 39.0–75.0)
NRBC: 0 % (ref 0–0)
Platelets: 186 10*3/uL (ref 140–400)
RBC: 3.38 10*6/uL — AB (ref 4.20–5.82)
RDW: 14.3 % (ref 11.0–14.6)
WBC: 1.9 10*3/uL — ABNORMAL LOW (ref 4.0–10.3)
lymph#: 0.6 10*3/uL — ABNORMAL LOW (ref 0.9–3.3)

## 2013-12-17 ENCOUNTER — Other Ambulatory Visit (HOSPITAL_BASED_OUTPATIENT_CLINIC_OR_DEPARTMENT_OTHER): Payer: Medicare Other

## 2013-12-17 DIAGNOSIS — C3430 Malignant neoplasm of lower lobe, unspecified bronchus or lung: Secondary | ICD-10-CM

## 2013-12-17 DIAGNOSIS — C3490 Malignant neoplasm of unspecified part of unspecified bronchus or lung: Secondary | ICD-10-CM

## 2013-12-17 LAB — CBC WITH DIFFERENTIAL/PLATELET
BASO%: 0 % (ref 0.0–2.0)
Basophils Absolute: 0 10*3/uL (ref 0.0–0.1)
EOS ABS: 0.1 10*3/uL (ref 0.0–0.5)
EOS%: 2.7 % (ref 0.0–7.0)
HEMATOCRIT: 29.1 % — AB (ref 38.4–49.9)
HGB: 10.2 g/dL — ABNORMAL LOW (ref 13.0–17.1)
LYMPH#: 0.8 10*3/uL — AB (ref 0.9–3.3)
LYMPH%: 14.5 % (ref 14.0–49.0)
MCH: 31.7 pg (ref 27.2–33.4)
MCHC: 35.1 g/dL (ref 32.0–36.0)
MCV: 90.4 fL (ref 79.3–98.0)
MONO#: 0.8 10*3/uL (ref 0.1–0.9)
MONO%: 15.6 % — ABNORMAL HIGH (ref 0.0–14.0)
NEUT#: 3.5 10*3/uL (ref 1.5–6.5)
NEUT%: 67.2 % (ref 39.0–75.0)
Platelets: 121 10*3/uL — ABNORMAL LOW (ref 140–400)
RBC: 3.22 10*6/uL — ABNORMAL LOW (ref 4.20–5.82)
RDW: 14.2 % (ref 11.0–14.6)
WBC: 5.3 10*3/uL (ref 4.0–10.3)
nRBC: 0 % (ref 0–0)

## 2013-12-17 LAB — COMPREHENSIVE METABOLIC PANEL (CC13)
ALBUMIN: 3.2 g/dL — AB (ref 3.5–5.0)
ALT: 41 U/L (ref 0–55)
AST: 30 U/L (ref 5–34)
Alkaline Phosphatase: 121 U/L (ref 40–150)
Anion Gap: 11 mEq/L (ref 3–11)
BUN: 31.9 mg/dL — ABNORMAL HIGH (ref 7.0–26.0)
CHLORIDE: 102 meq/L (ref 98–109)
CO2: 24 mEq/L (ref 22–29)
Calcium: 9.2 mg/dL (ref 8.4–10.4)
Creatinine: 1.9 mg/dL — ABNORMAL HIGH (ref 0.7–1.3)
GLUCOSE: 100 mg/dL (ref 70–140)
POTASSIUM: 3.3 meq/L — AB (ref 3.5–5.1)
Sodium: 137 mEq/L (ref 136–145)
Total Bilirubin: 0.32 mg/dL (ref 0.20–1.20)
Total Protein: 7.2 g/dL (ref 6.4–8.3)

## 2013-12-23 ENCOUNTER — Other Ambulatory Visit: Payer: Self-pay | Admitting: Internal Medicine

## 2013-12-24 ENCOUNTER — Encounter: Payer: Self-pay | Admitting: Internal Medicine

## 2013-12-24 ENCOUNTER — Other Ambulatory Visit (HOSPITAL_BASED_OUTPATIENT_CLINIC_OR_DEPARTMENT_OTHER): Payer: Medicare Other

## 2013-12-24 ENCOUNTER — Ambulatory Visit (HOSPITAL_BASED_OUTPATIENT_CLINIC_OR_DEPARTMENT_OTHER): Payer: Medicare Other | Admitting: Internal Medicine

## 2013-12-24 ENCOUNTER — Other Ambulatory Visit: Payer: Self-pay | Admitting: *Deleted

## 2013-12-24 ENCOUNTER — Telehealth: Payer: Self-pay | Admitting: Internal Medicine

## 2013-12-24 ENCOUNTER — Ambulatory Visit (HOSPITAL_BASED_OUTPATIENT_CLINIC_OR_DEPARTMENT_OTHER): Payer: Medicare Other

## 2013-12-24 VITALS — BP 146/87 | HR 96 | Temp 98.1°F | Resp 18 | Ht 68.5 in | Wt 191.4 lb

## 2013-12-24 DIAGNOSIS — R5381 Other malaise: Secondary | ICD-10-CM

## 2013-12-24 DIAGNOSIS — R5383 Other fatigue: Secondary | ICD-10-CM

## 2013-12-24 DIAGNOSIS — C3491 Malignant neoplasm of unspecified part of right bronchus or lung: Secondary | ICD-10-CM

## 2013-12-24 DIAGNOSIS — C3430 Malignant neoplasm of lower lobe, unspecified bronchus or lung: Secondary | ICD-10-CM

## 2013-12-24 DIAGNOSIS — C3490 Malignant neoplasm of unspecified part of unspecified bronchus or lung: Secondary | ICD-10-CM

## 2013-12-24 LAB — COMPREHENSIVE METABOLIC PANEL (CC13)
ALBUMIN: 3.2 g/dL — AB (ref 3.5–5.0)
ALK PHOS: 109 U/L (ref 40–150)
ALT: 23 U/L (ref 0–55)
AST: 23 U/L (ref 5–34)
Anion Gap: 11 mEq/L (ref 3–11)
BUN: 21.5 mg/dL (ref 7.0–26.0)
CO2: 26 mEq/L (ref 22–29)
Calcium: 9.1 mg/dL (ref 8.4–10.4)
Chloride: 105 mEq/L (ref 98–109)
Creatinine: 2 mg/dL — ABNORMAL HIGH (ref 0.7–1.3)
GLUCOSE: 95 mg/dL (ref 70–140)
Potassium: 3.2 mEq/L — ABNORMAL LOW (ref 3.5–5.1)
SODIUM: 142 meq/L (ref 136–145)
TOTAL PROTEIN: 7.1 g/dL (ref 6.4–8.3)
Total Bilirubin: 0.28 mg/dL (ref 0.20–1.20)

## 2013-12-24 LAB — CBC WITH DIFFERENTIAL/PLATELET
BASO%: 0.7 % (ref 0.0–2.0)
Basophils Absolute: 0 10*3/uL (ref 0.0–0.1)
EOS ABS: 0.4 10*3/uL (ref 0.0–0.5)
EOS%: 5.5 % (ref 0.0–7.0)
HCT: 33.5 % — ABNORMAL LOW (ref 38.4–49.9)
HGB: 11.1 g/dL — ABNORMAL LOW (ref 13.0–17.1)
LYMPH%: 13.3 % — ABNORMAL LOW (ref 14.0–49.0)
MCH: 32.3 pg (ref 27.2–33.4)
MCHC: 33.3 g/dL (ref 32.0–36.0)
MCV: 97.1 fL (ref 79.3–98.0)
MONO#: 1.1 10*3/uL — ABNORMAL HIGH (ref 0.1–0.9)
MONO%: 15.5 % — AB (ref 0.0–14.0)
NEUT%: 65 % (ref 39.0–75.0)
NEUTROS ABS: 4.6 10*3/uL (ref 1.5–6.5)
Platelets: 373 10*3/uL (ref 140–400)
RBC: 3.45 10*6/uL — ABNORMAL LOW (ref 4.20–5.82)
RDW: 15.8 % — ABNORMAL HIGH (ref 11.0–14.6)
WBC: 7 10*3/uL (ref 4.0–10.3)
lymph#: 0.9 10*3/uL (ref 0.9–3.3)

## 2013-12-24 MED ORDER — SODIUM CHLORIDE 0.9 % IV SOLN
Freq: Once | INTRAVENOUS | Status: DC
  Administered 2013-12-24: 11:00:00 via INTRAVENOUS

## 2013-12-24 NOTE — Progress Notes (Signed)
Quick Note:  Call patient with the result and encourage K rich diet ______ 

## 2013-12-24 NOTE — Progress Notes (Signed)
Per Dr. Julien Nordmann will hold Alimta today due to elevated creatinine of 2.0. Pt to receive 1 Liter of NS over two hours instead. Patient aware and verbalized understanding.

## 2013-12-24 NOTE — Progress Notes (Signed)
[facility] [phone]   [phone]  OFFICE PROGRESS NOTE  [name], MD [place] [address]  DIAGNOSIS: Stage IIIB (T2a., N3, M0) non-small cell lung cancer consistent with invasive adenocarcinoma with negative ALK gene translocation and negative EGFR mutation diagnosed in October of 2013   PRIOR THERAPY:  1) Concurrent chemoradiation with weekly carboplatin for AUC of 2 and paclitaxel 45 mg/M2, last dose was given 06/11/2012 with partial response.  2) Consolidation chemotherapy with carboplatin for AUC of 5 and Alimta 500 mg/M2 every 3 weeks. Status post 3 cycles last dose was given 09/12/2012 with stable disease.   CURRENT THERAPY: Systemic chemotherapy again with carboplatin for AUC of 5 and Alimta 500 mg/M2 every 3 weeks. First cycle expected on 11/13/2013. Status post 2 cycles. Carboplatin was discontinued starting from cycle #2 secondary to hypersensitivity reaction.  CHEMOTHERAPY INTENT: Palliative  CURRENT # OF CHEMOTHERAPY CYCLES: 3  CURRENT ANTIEMETICS: N/A  CURRENT SMOKING STATUS: Former Smoker  ORAL CHEMOTHERAPY AND CONSENT: None  CURRENT BISPHOSPHONATES USE: None  PAIN MANAGEMENT: 0/10  NARCOTICS INDUCED CONSTIPATION: None  LIVING WILL AND CODE STATUS: no code Blue  INTERVAL HISTORY: Logan Russell 79 y.o. male returns to the clinic today for three-month follow up visit accompanied by his wife. He is on systemic chemotherapy with carboplatin and Alimta and tolerated the first cycle of this treatment fairly well except for mild fatigue a few days after the chemotherapy. During the second cycle of his treatment the patient had a hypersensitivity reaction to carboplatin and this was discontinued. He continues to have increasing fatigue and weakness after the second cycle of his treatment with single agent Alimta. He denied having any significant chest pain but continues to have shortness of  breath with exertion and dry cough with no hemoptysis. He denied having any significant weight loss or night sweats. The patient has no nausea or vomiting. He is here today to start cycle #3 with Alimta.  MEDICAL HISTORY: Past Medical History  Diagnosis Date  . Hypertension   . History of radiation therapy 05/02/12-06/19/12    left lung mediastinal lymph nodes 66Gy/54fx  . Lung cancer 04/12/2012    ALLERGIES:  has No Known Allergies.  MEDICATIONS:  Current Outpatient Prescriptions  Medication Sig Dispense Refill  . dexamethasone (DECADRON) 4 MG tablet 4 Milligram by mouth twice a day the day before, day of and day after the chemotherapy  40 tablet  1  . folic acid (FOLVITE) 1 MG tablet Take 1 tablet (1 mg total) by mouth daily.  30 tablet  4  . guaiFENesin (MUCINEX) 600 MG 12 hr tablet Take 1,200 mg by mouth 2 (two) times daily.      Marland Kitchen ibuprofen (ADVIL,MOTRIN) 200 MG tablet Take 400 mg by mouth every 6 (six) hours as needed.      Marland Kitchen PNEUMOVAX 23 25 MCG/0.5ML injection       . potassium chloride SA (K-DUR,KLOR-CON) 20 MEQ tablet Take 1 tablet by mouth for 7 days  7 tablet  0  . triamterene-hydrochlorothiazide (MAXZIDE) 75-50 MG per tablet Take 1 tablet by mouth daily.       No current facility-administered medications for this visit.    REVIEW OF SYSTEMS:  Constitutional: positive for fatigue Eyes: negative Ears, nose, mouth, throat, and face: negative Respiratory: positive for dyspnea on exertion Cardiovascular: negative Gastrointestinal: negative Genitourinary:negative Integument/breast: negative Hematologic/lymphatic: negative Musculoskeletal:negative Neurological: negative Behavioral/Psych: negative Endocrine: negative Allergic/Immunologic: negative  PHYSICAL EXAMINATION: General appearance: alert, cooperative and no distress Head: Normocephalic, without obvious abnormality, atraumatic Neck: no adenopathy Lymph nodes: Cervical, supraclavicular, and axillary nodes  normal. Resp: clear to auscultation bilaterally Cardio: regular rate and rhythm, S1, S2 normal, no murmur, click, rub or gallop GI: soft, non-tender; bowel sounds normal; no masses,  no organomegaly Extremities: extremities normal, atraumatic, no cyanosis or edema  ECOG PERFORMANCE STATUS: 1 - Symptomatic but completely ambulatory  Blood pressure 146/87, pulse 96, temperature 98.1 F (36.7 C), temperature source Oral, resp. rate 18, height 5' 8.5" (1.74 m), weight 191 lb 6.4 oz (86.818 kg).  LABORATORY DATA: Lab Results  Component Value Date   WBC 7.0 12/24/2013   HGB 11.1* 12/24/2013   HCT 33.5* 12/24/2013   MCV 97.1 12/24/2013   PLT 373 12/24/2013      Chemistry      Component Value Date/Time   NA 137 12/17/2013 1148   K 3.3* 12/17/2013 1148   CL 106 10/08/2012 1028   CO2 24 12/17/2013 1148   BUN 31.9* 12/17/2013 1148   CREATININE 1.9* 12/17/2013 1148      Component Value Date/Time   CALCIUM 9.2 12/17/2013 1148   ALKPHOS 121 12/17/2013 1148   AST 30 12/17/2013 1148   ALT 41 12/17/2013 1148   BILITOT 0.32 12/17/2013 1148       RADIOGRAPHIC STUDIES:  ASSESSMENT AND PLAN: this is a very pleasant 79 years old white male with history of stage IIIB non-small cell lung cancer status post concurrent chemoradiation followed by consolidation chemotherapy and has been observation. The patient is currently on systemic chemotherapy with carboplatin and Alimta status post 1 cycle. The second cycle was given with single agent Alimta but the patient continues to have increasing fatigue. History creatinine has been elevated recently. I will arrange for the patient to have 1 L of normal saline for IV hydration today. I would discontinue treatment with Alimta at this point and I will arrange for the patient to have repeat CT scan of the chest, abdomen and pelvis without contrast next week. He would come back for followup visit after the scan for evaluation and discussion of treatment options. He was  advised to call immediately if he has any concerning symptoms in the interval. The patient voices understanding of current disease status and treatment options and is in agreement with the current care plan.  All questions were answered. The patient knows to call the clinic with any problems, questions or concerns. We can certainly see the patient much sooner if necessary.  Disclaimer: This note was dictated with voice recognition software. Similar sounding words can inadvertently be transcribed and may not be corrected upon review.

## 2013-12-24 NOTE — Patient Instructions (Signed)
Dehydration, Adult Dehydration is when you lose more fluids from the body than you take in. Vital organs like the kidneys, brain, and heart cannot function without a proper amount of fluids and salt. Any loss of fluids from the body can cause dehydration.  CAUSES   Vomiting.  Diarrhea.  Excessive sweating.  Excessive urine output.  Fever. SYMPTOMS  Mild dehydration  Thirst.  Dry lips.  Slightly dry mouth. Moderate dehydration  Very dry mouth.  Sunken eyes.  Skin does not bounce back quickly when lightly pinched and released.  Dark urine and decreased urine production.  Decreased tear production.  Headache. Severe dehydration  Very dry mouth.  Extreme thirst.  Rapid, weak pulse (more than 100 beats per minute at rest).  Cold hands and feet.  Not able to sweat in spite of heat and temperature.  Rapid breathing.  Blue lips.  Confusion and lethargy.  Difficulty being awakened.  Minimal urine production.  No tears. DIAGNOSIS  Your caregiver will diagnose dehydration based on your symptoms and your exam. Blood and urine tests will help confirm the diagnosis. The diagnostic evaluation should also identify the cause of dehydration. TREATMENT  Treatment of mild or moderate dehydration can often be done at home by increasing the amount of fluids that you drink. It is best to drink small amounts of fluid more often. Drinking too much at one time can make vomiting worse. Refer to the home care instructions below. Severe dehydration needs to be treated at the hospital where you will probably be given intravenous (IV) fluids that contain water and electrolytes. HOME CARE INSTRUCTIONS   Ask your caregiver about specific rehydration instructions.  Drink enough fluids to keep your urine clear or pale yellow.  Drink small amounts frequently if you have nausea and vomiting.  Eat as you normally do.  Avoid:  Foods or drinks high in sugar.  Carbonated  drinks.  Juice.  Extremely hot or cold fluids.  Drinks with caffeine.  Fatty, greasy foods.  Alcohol.  Tobacco.  Overeating.  Gelatin desserts.  Wash your hands well to avoid spreading bacteria and viruses.  Only take over-the-counter or prescription medicines for pain, discomfort, or fever as directed by your caregiver.  Ask your caregiver if you should continue all prescribed and over-the-counter medicines.  Keep all follow-up appointments with your caregiver. SEEK MEDICAL CARE IF:  You have abdominal pain and it increases or stays in one area (localizes).  You have a rash, stiff neck, or severe headache.  You are irritable, sleepy, or difficult to awaken.  You are weak, dizzy, or extremely thirsty. SEEK IMMEDIATE MEDICAL CARE IF:   You are unable to keep fluids down or you get worse despite treatment.  You have frequent episodes of vomiting or diarrhea.  You have blood or green matter (bile) in your vomit.  You have blood in your stool or your stool looks black and tarry.  You have not urinated in 6 to 8 hours, or you have only urinated a small amount of very dark urine.  You have a fever.  You faint. MAKE SURE YOU:   Understand these instructions.  Will watch your condition.  Will get help right away if you are not doing well or get worse. Document Released: 05/16/2005 Document Revised: 08/08/2011 Document Reviewed: 01/03/2011 [employer] Patient Information 2015 [employer]. This information is not intended to replace advice given to you by your health care provider. Make sure you discuss any questions you have with your health care   provider.   Serum Creatinine Test A creatinine test measures the amount of creatinine in your blood serum. Creatinine is a waste product of normal muscle activity (contraction). The kidneys filter creatinine from your blood and excrete it from your body in urine. Blood creatinine levels stay consistent in people whose  muscle mass stays consistent. However, this level can be increased in people who perform resistance exercise to increase muscle mass.  The amount of creatinine in your blood serum is measured by a laboratory instrument. A sample of your blood is taken. An instrument causes chemical reactions to produce a color change in your blood sample. The degree of color change is also measured by the instrument. The degree of color change is directly related to the amount of creatinine in the sample. The more creatinine there is in your blood, the deeper the color becomes.  RESULTS It is your responsibility to obtain your test results. Ask the lab or department performing the test when and how you will get your results. Contact your caregiver to discuss any questions you have about your results. Range of Normal Values Ranges for normal values may vary among different labs and hospitals. You should always check with your doctor after having lab work or other tests done to discuss the meaning of your test results and whether your values are considered within normal limits. After adolescence, normal values of serum creatinine are different between females and males because adult males typically have more muscle mass than adult females. The range of values for serum creatinine are listed as follows:  Adult:  Male--0.5-1.1 mg/dL or 44-7 micromole/L (SI units)  Male--0.6-1.2 mg/dL or 53-106 micromole/L (SI units)  Adolescent--0.5-1.0 mg/dL or 44 micromole/L (SI units)  Child--0.3-0.7 mg/dL or 27-62 micromole/L (SI units)  Infant--0.2-0.4 mg/dL or 18-35 micromole/L (SI units)  Newborn--0.3-1.2 mg/dL or 27-106 micromole/L (SI units) The range of normal values may be higher in people who perform resistance exercise to increase muscle mass.  Meaning of Results Outside of Normal Value Ranges Abnormally high serum creatinine levels are most commonly seen in renal failure. When your kidneys are not working, the  creatinine is not removed. The levels of creatinine then build up in your blood. Abnormally high serum creatinine levels also can be seen with dehydration, overactive thyroid glands (hyperthyroidism), conditions related to overgrowth of the body (acromegaly or gigantism), abnormal breakdown of muscle tissue (rhabdomyolysis), and early muscular dystrophy. Abnormally low serum creatinine levels may indicate low muscle mass associated with malnutrition, lack of activity, increasing age, or late-stage muscular dystrophy. Abnormally low creatinine values also are seen during the first 6 months of pregnancy. Document Released: 06/08/2004 Document Revised: 05/02/2012 Document Reviewed: 04/02/2012 [facility] Patient Information 2015 [place], [place]. This information is not intended to replace advice given to you by your health care provider. Make sure you discuss any questions you have with your health care provider.

## 2013-12-24 NOTE — Telephone Encounter (Signed)
Pt confirmed labs/ov per 07/28 POF, gave pt AVS....KJ

## 2013-12-27 ENCOUNTER — Telehealth: Payer: Self-pay | Admitting: Medical Oncology

## 2013-12-27 NOTE — Telephone Encounter (Signed)
Message copied by Ardeen Garland on Fri Dec 27, 2013 11:11 AM ------      Message from: Curt Bears      Created: Tue Dec 24, 2013  4:36 PM       Call patient with the result and encourage K rich diet. ------

## 2013-12-27 NOTE — Telephone Encounter (Signed)
I left a voice message for pt to add K+ rich food to  diet including: bananas,oranges, avocado, squash, raisins, potatoes, cabbage,cauliflower, tuna,honey,beef,chicken,sardines,milk,turkey, .

## 2013-12-31 ENCOUNTER — Telehealth: Payer: Self-pay | Admitting: *Deleted

## 2013-12-31 ENCOUNTER — Other Ambulatory Visit (HOSPITAL_BASED_OUTPATIENT_CLINIC_OR_DEPARTMENT_OTHER): Payer: Medicare Other

## 2013-12-31 ENCOUNTER — Ambulatory Visit (HOSPITAL_COMMUNITY)
Admission: RE | Admit: 2013-12-31 | Discharge: 2013-12-31 | Disposition: A | Discharge status: Home / Self Care | Payer: Medicare Other | Source: Ambulatory Visit | Attending: Internal Medicine | Admitting: Internal Medicine

## 2013-12-31 DIAGNOSIS — C3430 Malignant neoplasm of lower lobe, unspecified bronchus or lung: Secondary | ICD-10-CM

## 2013-12-31 DIAGNOSIS — C3491 Malignant neoplasm of unspecified part of right bronchus or lung: Secondary | ICD-10-CM

## 2013-12-31 LAB — CBC WITH DIFFERENTIAL/PLATELET
BASO%: 1 % (ref 0.0–2.0)
Basophils Absolute: 0.1 10*3/uL (ref 0.0–0.1)
EOS ABS: 0.3 10*3/uL (ref 0.0–0.5)
EOS%: 3.8 % (ref 0.0–7.0)
HEMATOCRIT: 34.8 % — AB (ref 38.4–49.9)
HGB: 11.7 g/dL — ABNORMAL LOW (ref 13.0–17.1)
LYMPH#: 1.2 10*3/uL (ref 0.9–3.3)
LYMPH%: 16.1 % (ref 14.0–49.0)
MCH: 33.3 pg (ref 27.2–33.4)
MCHC: 33.6 g/dL (ref 32.0–36.0)
MCV: 99.1 fL — ABNORMAL HIGH (ref 79.3–98.0)
MONO#: 1 10*3/uL — AB (ref 0.1–0.9)
MONO%: 13.6 % (ref 0.0–14.0)
NEUT%: 65.5 % (ref 39.0–75.0)
NEUTROS ABS: 4.9 10*3/uL (ref 1.5–6.5)
Platelets: 227 10*3/uL (ref 140–400)
RBC: 3.51 10*6/uL — AB (ref 4.20–5.82)
RDW: 20.1 % — ABNORMAL HIGH (ref 11.0–14.6)
WBC: 7.4 10*3/uL (ref 4.0–10.3)

## 2013-12-31 LAB — COMPREHENSIVE METABOLIC PANEL (CC13)
ALT: 17 U/L (ref 0–55)
ANION GAP: 11 meq/L (ref 3–11)
AST: 24 U/L (ref 5–34)
Albumin: 3.3 g/dL — ABNORMAL LOW (ref 3.5–5.0)
Alkaline Phosphatase: 115 U/L (ref 40–150)
BILIRUBIN TOTAL: 0.43 mg/dL (ref 0.20–1.20)
BUN: 23.9 mg/dL (ref 7.0–26.0)
CHLORIDE: 103 meq/L (ref 98–109)
CO2: 26 meq/L (ref 22–29)
Calcium: 9.5 mg/dL (ref 8.4–10.4)
Creatinine: 1.8 mg/dL — ABNORMAL HIGH (ref 0.7–1.3)
GLUCOSE: 91 mg/dL (ref 70–140)
Potassium: 3.5 mEq/L (ref 3.5–5.1)
SODIUM: 140 meq/L (ref 136–145)
Total Protein: 7.3 g/dL (ref 6.4–8.3)

## 2013-12-31 NOTE — Telephone Encounter (Signed)
Pt came as a walk-in with question about contrast for CT scan.  Spoke with Judeen Hammans  in radiology.  Per Judeen Hammans,  Ros[place] for pt  to drink and need to drink  oral contrast for CT scan; however, pt will not receive IV contrast due to increase Crea level.  Dr. Julien Nordmann notified of radiology information.  OK with md for pt to follow radiology instructions.  Dr. Julien Nordmann also reviewed CMET results done today. Spoke with pt and wife in the lobby.  Explanations given to pt and wife.  Both voiced understanding.

## 2014-01-02 ENCOUNTER — Other Ambulatory Visit: Payer: Self-pay | Admitting: *Deleted

## 2014-01-06 ENCOUNTER — Telehealth: Payer: Self-pay | Admitting: Internal Medicine

## 2014-01-06 NOTE — Telephone Encounter (Signed)
S/w pt, advised lab appt added prior to ct on 8/13 @ 9.30am.

## 2014-01-07 ENCOUNTER — Other Ambulatory Visit: Payer: Medicare Other

## 2014-01-09 ENCOUNTER — Ambulatory Visit (HOSPITAL_COMMUNITY)
Admission: RE | Admit: 2014-01-09 | Discharge: 2014-01-09 | Disposition: A | Discharge status: Home / Self Care | Payer: Medicare Other | Source: Ambulatory Visit | Attending: Internal Medicine | Admitting: Internal Medicine

## 2014-01-09 ENCOUNTER — Encounter: Payer: Self-pay | Admitting: Physician Assistant

## 2014-01-09 ENCOUNTER — Other Ambulatory Visit: Payer: Medicare Other

## 2014-01-09 ENCOUNTER — Telehealth: Payer: Self-pay | Admitting: Internal Medicine

## 2014-01-09 ENCOUNTER — Ambulatory Visit (HOSPITAL_BASED_OUTPATIENT_CLINIC_OR_DEPARTMENT_OTHER): Payer: Medicare Other | Admitting: Physician Assistant

## 2014-01-09 ENCOUNTER — Other Ambulatory Visit (HOSPITAL_BASED_OUTPATIENT_CLINIC_OR_DEPARTMENT_OTHER): Payer: Medicare Other

## 2014-01-09 VITALS — BP 145/89 | HR 111 | Temp 98.6°F | Resp 18 | Ht 68.5 in | Wt 191.3 lb

## 2014-01-09 DIAGNOSIS — C3490 Malignant neoplasm of unspecified part of unspecified bronchus or lung: Secondary | ICD-10-CM

## 2014-01-09 DIAGNOSIS — I700 Atherosclerosis of aorta: Secondary | ICD-10-CM | POA: Diagnosis not present

## 2014-01-09 DIAGNOSIS — R5381 Other malaise: Secondary | ICD-10-CM

## 2014-01-09 DIAGNOSIS — R5383 Other fatigue: Secondary | ICD-10-CM

## 2014-01-09 DIAGNOSIS — R0609 Other forms of dyspnea: Secondary | ICD-10-CM

## 2014-01-09 DIAGNOSIS — C3430 Malignant neoplasm of lower lobe, unspecified bronchus or lung: Secondary | ICD-10-CM

## 2014-01-09 DIAGNOSIS — R0989 Other specified symptoms and signs involving the circulatory and respiratory systems: Secondary | ICD-10-CM

## 2014-01-09 LAB — COMPREHENSIVE METABOLIC PANEL (CC13)
ALT: 19 U/L (ref 0–55)
ANION GAP: 11 meq/L (ref 3–11)
AST: 26 U/L (ref 5–34)
Albumin: 3.7 g/dL (ref 3.5–5.0)
Alkaline Phosphatase: 111 U/L (ref 40–150)
BILIRUBIN TOTAL: 0.39 mg/dL (ref 0.20–1.20)
BUN: 27.4 mg/dL — ABNORMAL HIGH (ref 7.0–26.0)
CALCIUM: 9.8 mg/dL (ref 8.4–10.4)
CHLORIDE: 101 meq/L (ref 98–109)
CO2: 28 meq/L (ref 22–29)
Creatinine: 1.8 mg/dL — ABNORMAL HIGH (ref 0.7–1.3)
Glucose: 96 mg/dl (ref 70–140)
Potassium: 3.4 mEq/L — ABNORMAL LOW (ref 3.5–5.1)
SODIUM: 140 meq/L (ref 136–145)
Total Protein: 7.9 g/dL (ref 6.4–8.3)

## 2014-01-09 LAB — CBC WITH DIFFERENTIAL/PLATELET
BASO%: 1.2 % (ref 0.0–2.0)
Basophils Absolute: 0.1 10*3/uL (ref 0.0–0.1)
EOS%: 3.9 % (ref 0.0–7.0)
Eosinophils Absolute: 0.3 10*3/uL (ref 0.0–0.5)
HCT: 37.4 % — ABNORMAL LOW (ref 38.4–49.9)
HGB: 12.5 g/dL — ABNORMAL LOW (ref 13.0–17.1)
LYMPH#: 1.2 10*3/uL (ref 0.9–3.3)
LYMPH%: 15.6 % (ref 14.0–49.0)
MCH: 33.5 pg — ABNORMAL HIGH (ref 27.2–33.4)
MCHC: 33.5 g/dL (ref 32.0–36.0)
MCV: 100 fL — ABNORMAL HIGH (ref 79.3–98.0)
MONO#: 0.9 10*3/uL (ref 0.1–0.9)
MONO%: 11.2 % (ref 0.0–14.0)
NEUT#: 5.3 10*3/uL (ref 1.5–6.5)
NEUT%: 68.1 % (ref 39.0–75.0)
Platelets: 190 10*3/uL (ref 140–400)
RBC: 3.74 10*6/uL — AB (ref 4.20–5.82)
RDW: 20.7 % — AB (ref 11.0–14.6)
WBC: 7.8 10*3/uL (ref 4.0–10.3)

## 2014-01-09 MED ORDER — E-Z SPACER DEVI: Status: DC

## 2014-01-09 MED ORDER — ALBUTEROL SULFATE HFA 108 (90 BASE) MCG/ACT IN AERS: 2.0000 | INHALATION_SPRAY | Freq: Four times a day (QID) | RESPIRATORY_TRACT | Status: DC | PRN

## 2014-01-09 NOTE — Telephone Encounter (Signed)
gv pt appt schedule for aug/sept.

## 2014-01-09 NOTE — Progress Notes (Addendum)
[place] [phone]   [phone]  OFFICE PROGRESS NOTE  [name], MD [place] [address]  DIAGNOSIS: Stage IIIB (T2a., N3, M0) non-small cell lung cancer consistent with invasive adenocarcinoma with negative ALK gene translocation and negative EGFR mutation diagnosed in October of 2013   PRIOR THERAPY:  1) Concurrent chemoradiation with weekly carboplatin for AUC of 2 and paclitaxel 45 mg/M2, last dose was given 06/11/2012 with partial response.  2) Consolidation chemotherapy with carboplatin for AUC of 5 and Alimta 500 mg/M2 every 3 weeks. Status post 3 cycles last dose was given 09/12/2012 with stable disease.   CURRENT THERAPY: Systemic chemotherapy again with carboplatin for AUC of 5 and Alimta 500 mg/M2 every 3 weeks. First cycle expected on 11/13/2013. Status post 3 cycles. Carboplatin was discontinued starting from cycle #2 secondary to hypersensitivity reaction.  CHEMOTHERAPY INTENT: Palliative  CURRENT # OF CHEMOTHERAPY CYCLES: 3  CURRENT ANTIEMETICS: N/A  CURRENT SMOKING STATUS: Former Smoker  ORAL CHEMOTHERAPY AND CONSENT: None  CURRENT BISPHOSPHONATES USE: None  PAIN MANAGEMENT: 0/10  NARCOTICS INDUCED CONSTIPATION: None  LIVING WILL AND CODE STATUS: no code Blue  INTERVAL HISTORY: Logan Russell 79 y.o. male returns to the clinic today for three-month follow up visit accompanied by his wife. He is on systemic chemotherapy with carboplatin and Alimta and tolerated the first cycle of this treatment fairly well except for mild fatigue a few days after the chemotherapy. During the second cycle of his treatment the patient had a hypersensitivity reaction to carboplatin and this was discontinued. He continues to have increasing fatigue and weakness after continuing his treatment with single agent Alimta. He denied having any significant chest pain but continues to have shortness of breath with  exertion and dry cough with no hemoptysis. He denied having any significant weight loss or night sweats. The patient has no nausea or vomiting. He recently had a restaging CT scan of the chest, abdomen and pelvis and presents to discuss the results.  MEDICAL HISTORY: Past Medical History  Diagnosis Date  . Hypertension   . History of radiation therapy 05/02/12-06/19/12    left lung mediastinal lymph nodes 66Gy/62fx  . Lung cancer 04/12/2012    ALLERGIES:  has No Known Allergies.  MEDICATIONS:  Current Outpatient Prescriptions  Medication Sig Dispense Refill  . folic acid (FOLVITE) 1 MG tablet Take 1 tablet (1 mg total) by mouth daily.  30 tablet  4  . guaiFENesin (MUCINEX) 600 MG 12 hr tablet Take 1,200 mg by mouth 2 (two) times daily.      Marland Kitchen triamterene-hydrochlorothiazide (MAXZIDE) 75-50 MG per tablet Take 1 tablet by mouth daily.      Marland Kitchen dexamethasone (DECADRON) 4 MG tablet 4 Milligram by mouth twice a day the day before, day of and day after the chemotherapy  40 tablet  1  . ibuprofen (ADVIL,MOTRIN) 200 MG tablet Take 400 mg by mouth every 6 (six) hours as needed.      Marland Kitchen PNEUMOVAX 23 25 MCG/0.5ML injection       . potassium chloride SA (K-DUR,KLOR-CON) 20 MEQ tablet Take 1 tablet by mouth for 7 days  7 tablet  0  . traMADol (ULTRAM) 50 MG tablet        No current facility-administered medications for this visit.    REVIEW OF SYSTEMS:  Constitutional: positive for fatigue Eyes: negative Ears, nose, mouth, throat, and face: negative Respiratory: positive for cough and dyspnea on exertion  Cardiovascular: negative Gastrointestinal: negative Genitourinary:negative Integument/breast: negative Hematologic/lymphatic: negative Musculoskeletal:negative Neurological: negative Behavioral/Psych: negative Endocrine: negative Allergic/Immunologic: negative   PHYSICAL EXAMINATION: General appearance: alert, cooperative and no distress Head: Normocephalic, without obvious abnormality,  atraumatic Neck: no adenopathy Lymph nodes: Cervical, supraclavicular, and axillary nodes normal. Resp: wheezes generalized Cardio: regular rate and rhythm, S1, S2 normal, no murmur, click, rub or gallop GI: soft, non-tender; bowel sounds normal; no masses,  no organomegaly Extremities: extremities normal, atraumatic, no cyanosis or edema  ECOG PERFORMANCE STATUS: 1 - Symptomatic but completely ambulatory  Blood pressure 145/89, pulse 111, temperature 98.6 F (37 C), resp. rate 18, height 5' 8.5" (1.74 m), weight 191 lb 4.8 oz (86.773 kg).  LABORATORY DATA: Lab Results  Component Value Date   WBC 7.8 01/09/2014   HGB 12.5* 01/09/2014   HCT 37.4* 01/09/2014   MCV 100.0* 01/09/2014   PLT 190 01/09/2014      Chemistry      Component Value Date/Time   NA 140 01/09/2014 0925   K 3.4* 01/09/2014 0925   CL 106 10/08/2012 1028   CO2 28 01/09/2014 0925   BUN 27.4* 01/09/2014 0925   CREATININE 1.8* 01/09/2014 0925      Component Value Date/Time   CALCIUM 9.8 01/09/2014 0925   ALKPHOS 111 01/09/2014 0925   AST 26 01/09/2014 0925   ALT 19 01/09/2014 0925   BILITOT 0.39 01/09/2014 0925       RADIOGRAPHIC STUDIES: Ct Abdomen Pelvis Wo Contrast  01/09/2014   CLINICAL DATA:  Restaging lung cancer.  EXAM: CT CHEST, ABDOMEN AND PELVIS WITHOUT CONTRAST  TECHNIQUE: Multidetector CT imaging of the chest, abdomen and pelvis was performed following the standard protocol without IV contrast.  COMPARISON:  10/29/2013 CT scan and PET-CT 04/23/2013  FINDINGS: CT CHEST FINDINGS  Chest wall:  No chest wall mass, supraclavicular or axillary adenopathy the bony thorax is intact. No acute bony findings or destructive bony changes. Advanced degenerative changes are noted in the spine.  Mediastinum:  The heart is normal in size. No pericardial effusion. Stable mediastinal and hilar lymph nodes and ill-defined soft tissue thickening in the right mediastinum and paramediastinal long which is likely radiation change. Stable  coronary artery calcifications and aortic calcifications. The esophagus is grossly normal.  Lungs:  Stable radiation changes. Ill-defined airspace opacity in the right lower lobe on image number 29 measures 18.5 x 17.0 mm and previously measured 25.5 x 22 mm. The ill-defined right hilar density on image number 33 measures 19 x 15 mm and previously measured 29 x 22 mm. The right lower lobe density previously measured a maximum of 19 mm and now measures 17 mm. Small scattered left lower lobe lung nodules are stable. The left lower lobe nodule on image number 43 near the left ventricle measured 10 mm and is stable at 10 mm. Subpleural density in the left lower lobe on image number 45 measured 11 mm and now measures 10 mm. Other smaller nodules are stable. No new lesions are identified.  Stable emphysematous changes. No infiltrates or effusions. Stable left basilar atelectasis and scarring.  CT ABDOMEN AND PELVIS FINDINGS  CT abdomen:  Stable left hepatic lobe cyst. No new or worrisome hepatic lesions. The gallbladder is normal. No common bile duct dilatation. The pancreas is unremarkable. Stable duodenum diverticulum. The spleen is normal in size. No focal lesions. The adrenal glands are unremarkable and stable. Stable adenomas. No renal abnormalities except for simple cysts.  The stomach, duodenum, small bowel and colon  are unremarkable. No inflammatory changes, mass lesions or obstructive findings. Scattered colonic diverticulosis without findings for acute diverticulitis. The appendix is normal.  There are enlarging retroperitoneal lymph nodes. There is an 18 x 14 mm node adjacent to the left common iliac artery on image number 87. Interaortocaval lymph nodes are borderline enlarged. The largest node measures 12.5 x 11 mm on image number 71.  Advanced atherosclerotic calcifications involving the aorta but no focal aneurysm. 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 There is also a small focal saccular aneurysm  versus proximal dissection of the left iliac artery.  CT pelvis:  The bladder, prostate gland and seminal vesicles are unremarkable. Mild prostate gland enlargement. No pelvic mass or free pelvic fluid collection. There is left-sided pelvic adenopathy. Nodal mass on image number 100 measures 3.6 x 2.0 cm. No inguinal adenopathy or mass.  Bones:  Advanced degenerative changes involving the spine but no destructive bone lesions. Moderate hip joint degenerative changes but no pelvic bone lesions.  IMPRESSION: 1. Improved CT appearance of the lungs. The lung lesions are smaller or stable as specifically discussed above. No new lesions. 2. Stable radiation changes and stable mediastinal and hilar lymph nodes. 3. New and progressive adenopathy in the abdomen/pelvis. 4. Chronic advanced atherosclerotic disease involving the aorta and branch vessels.   Electronically Signed   By: Kalman Jewels M.D.   On: 01/09/2014 12:15   Ct Chest Wo Contrast  01/09/2014   CLINICAL DATA:  Restaging lung cancer.  EXAM: CT CHEST, ABDOMEN AND PELVIS WITHOUT CONTRAST  TECHNIQUE: Multidetector CT imaging of the chest, abdomen and pelvis was performed following the standard protocol without IV contrast.  COMPARISON:  10/29/2013 CT scan and PET-CT 04/23/2013  FINDINGS: CT CHEST FINDINGS  Chest wall:  No chest wall mass, supraclavicular or axillary adenopathy the bony thorax is intact. No acute bony findings or destructive bony changes. Advanced degenerative changes are noted in the spine.  Mediastinum:  The heart is normal in size. No pericardial effusion. Stable mediastinal and hilar lymph nodes and ill-defined soft tissue thickening in the right mediastinum and paramediastinal long which is likely radiation change. Stable coronary artery calcifications and aortic calcifications. The esophagus is grossly normal.  Lungs:  Stable radiation changes. Ill-defined airspace opacity in the right lower lobe on image number 29 measures 18.5 x 17.0 mm  and previously measured 25.5 x 22 mm. The ill-defined right hilar density on image number 33 measures 19 x 15 mm and previously measured 29 x 22 mm. The right lower lobe density previously measured a maximum of 19 mm and now measures 17 mm. Small scattered left lower lobe lung nodules are stable. The left lower lobe nodule on image number 43 near the left ventricle measured 10 mm and is stable at 10 mm. Subpleural density in the left lower lobe on image number 45 measured 11 mm and now measures 10 mm. Other smaller nodules are stable. No new lesions are identified.  Stable emphysematous changes. No infiltrates or effusions. Stable left basilar atelectasis and scarring.  CT ABDOMEN AND PELVIS FINDINGS  CT abdomen:  Stable left hepatic lobe cyst. No new or worrisome hepatic lesions. The gallbladder is normal. No common bile duct dilatation. The pancreas is unremarkable. Stable duodenum diverticulum. The spleen is normal in size. No focal lesions. The adrenal glands are unremarkable and stable. Stable adenomas. No renal abnormalities except for simple cysts.  The stomach, duodenum, small bowel and colon are unremarkable. No inflammatory changes, mass lesions or  obstructive findings. Scattered colonic diverticulosis without findings for acute diverticulitis. The appendix is normal.  There are enlarging retroperitoneal lymph nodes. There is an 18 x 14 mm node adjacent to the left common iliac artery on image number 87. Interaortocaval lymph nodes are borderline enlarged. The largest node measures 12.5 x 11 mm on image number 71.  Advanced atherosclerotic calcifications involving the aorta but no focal aneurysm. 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 There is also a small focal saccular aneurysm versus proximal dissection of the left iliac artery.  CT pelvis:  The bladder, prostate gland and seminal vesicles are unremarkable. Mild prostate gland enlargement. No pelvic mass or free pelvic fluid collection. There is  left-sided pelvic adenopathy. Nodal mass on image number 100 measures 3.6 x 2.0 cm. No inguinal adenopathy or mass.  Bones:  Advanced degenerative changes involving the spine but no destructive bone lesions. Moderate hip joint degenerative changes but no pelvic bone lesions.  IMPRESSION: 1. Improved CT appearance of the lungs. The lung lesions are smaller or stable as specifically discussed above. No new lesions. 2. Stable radiation changes and stable mediastinal and hilar lymph nodes. 3. New and progressive adenopathy in the abdomen/pelvis. 4. Chronic advanced atherosclerotic disease involving the aorta and branch vessels.   Electronically Signed   By: Kalman Jewels M.D.   On: 01/09/2014 12:15   ASSESSMENT AND PLAN: this is a very pleasant 79 years old white male with history of stage IIIB non-small cell lung cancer status post concurrent chemoradiation followed by consolidation chemotherapy and has been observation. The patient is currently on systemic chemotherapy with carboplatin and Alimta status post 1 cycle. The second and third cycles were given with single agent Alimta but the patient continues to have increasing fatigue. History creatinine has been elevated recently. Creatinine today is 1.8 is relatively stable. Patient was discussed with him and also seen by Dr. Julien Nordmann. His recent restaging CT scan of the chest abdomen and pelvis revealed stable to improved appearance within the lung however there was new and progressive adenopathy in the abdomen and pelvis. Dr. Julien Nordmann reviewed that it is results with the patient and his wife and recommended for the patient to proceed with immunotherapy with Opdivo (nivolumab). Side effects of this treatment were discussed with the patient including but not limited to shortness of breath, diarrhea and skin rash as well as abnormalities the thyroid and other endocrine systems. The patient is agreeable to proceeding with this treatment and gave verbal consent. We  will consult with our managed care Department to make sure the patient has insurance coverage for this medication prior to initiating therapy. We'll we will plan to initiate therapy next week if insurance coverage is available. Regarding his generalized wheezing and a prescription for an albuterol inhaler with the addition of a spacer to his pharmacy of record via E. scribe. He may take 1-2 puffs of the albuterol inhaler via the spacer every 6 hours as needed for wheezing. He will followup in approximately 3 weeks prior to the next scheduled cycle of immunotherapy. He was advised to call immediately if he has any concerning symptoms in the interval. The patient voices understanding of current disease status and treatment options and is in agreement with the current care plan.  All questions were answered. The patient knows to call the clinic with any problems, questions or concerns. We can certainly see the patient much sooner if necessary.  Carlton Adam PA-C  ADDENDUM: Hematology/Oncology Attending: I had a face  to face encounter with the patient. I recommended his care plan. This is a very pleasant 79 years old white male with recurrent non-small cell lung cancer, adenocarcinoma recently treated with 3 cycles of systemic chemotherapy with carboplatin and Alimta but carboplatin was discontinued secondary to hypersensitivity reaction starting cycle #2. His recent CT scan of the chest, abdomen and pelvis showed evidence for disease progression after this treatment. I have a lengthy discussion with the patient and his wife today about his current disease status and treatment options. I gave the patient the options of treatment with immunotherapy with Nivolumab versus consideration of second line chemotherapy with docetaxel and Cyramza. I discussed with the patient and his wife the adverse effects of these treatments. He would like to proceed with immunotherapy treatment. He would be treated with  Nivolumab 3 mg/kg every 2 weeks. We will obtain a preauthorization for this treatment and the patient is expected to start the first cycle of this treatment in one week. He would come back for followup visit in 3 weeks with the start of cycle #2. He was advised to call immediately if he has any concerning symptoms in the interval.  Disclaimer: This note was dictated with voice recognition software. Similar sounding words can inadvertently be transcribed and may not be corrected upon review. Eilleen Kempf., MD 01/12/2014

## 2014-01-10 NOTE — Patient Instructions (Signed)
Return as scheduled next week to begin immunotherapy Follow up in 3 weeks

## 2014-01-14 ENCOUNTER — Ambulatory Visit: Payer: Medicare Other

## 2014-01-14 ENCOUNTER — Ambulatory Visit (HOSPITAL_BASED_OUTPATIENT_CLINIC_OR_DEPARTMENT_OTHER): Payer: Medicare Other

## 2014-01-14 ENCOUNTER — Ambulatory Visit: Payer: Medicare Other | Admitting: Physician Assistant

## 2014-01-14 ENCOUNTER — Other Ambulatory Visit (HOSPITAL_BASED_OUTPATIENT_CLINIC_OR_DEPARTMENT_OTHER): Payer: Medicare Other

## 2014-01-14 ENCOUNTER — Other Ambulatory Visit: Payer: Medicare Other

## 2014-01-14 VITALS — BP 156/85 | HR 86 | Temp 98.1°F | Resp 18

## 2014-01-14 DIAGNOSIS — C3430 Malignant neoplasm of lower lobe, unspecified bronchus or lung: Secondary | ICD-10-CM

## 2014-01-14 DIAGNOSIS — Z79899 Other long term (current) drug therapy: Secondary | ICD-10-CM

## 2014-01-14 DIAGNOSIS — C3490 Malignant neoplasm of unspecified part of unspecified bronchus or lung: Secondary | ICD-10-CM

## 2014-01-14 DIAGNOSIS — C3410 Malignant neoplasm of upper lobe, unspecified bronchus or lung: Secondary | ICD-10-CM

## 2014-01-14 DIAGNOSIS — Z5112 Encounter for antineoplastic immunotherapy: Secondary | ICD-10-CM

## 2014-01-14 LAB — COMPREHENSIVE METABOLIC PANEL (CC13)
ALK PHOS: 104 U/L (ref 40–150)
ALT: 12 U/L (ref 0–55)
AST: 20 U/L (ref 5–34)
Albumin: 3.6 g/dL (ref 3.5–5.0)
Anion Gap: 11 mEq/L (ref 3–11)
BUN: 20.7 mg/dL (ref 7.0–26.0)
CO2: 26 mEq/L (ref 22–29)
CREATININE: 1.6 mg/dL — AB (ref 0.7–1.3)
Calcium: 9.4 mg/dL (ref 8.4–10.4)
Chloride: 103 mEq/L (ref 98–109)
Glucose: 87 mg/dl (ref 70–140)
POTASSIUM: 3.4 meq/L — AB (ref 3.5–5.1)
Sodium: 139 mEq/L (ref 136–145)
Total Bilirubin: 0.26 mg/dL (ref 0.20–1.20)
Total Protein: 7.6 g/dL (ref 6.4–8.3)

## 2014-01-14 LAB — CBC WITH DIFFERENTIAL/PLATELET
BASO%: 0.9 % (ref 0.0–2.0)
BASOS ABS: 0.1 10*3/uL (ref 0.0–0.1)
EOS%: 4.1 % (ref 0.0–7.0)
Eosinophils Absolute: 0.3 10*3/uL (ref 0.0–0.5)
HCT: 36.4 % — ABNORMAL LOW (ref 38.4–49.9)
HEMOGLOBIN: 12 g/dL — AB (ref 13.0–17.1)
LYMPH%: 14 % (ref 14.0–49.0)
MCH: 33.3 pg (ref 27.2–33.4)
MCHC: 33 g/dL (ref 32.0–36.0)
MCV: 101.1 fL — AB (ref 79.3–98.0)
MONO#: 0.8 10*3/uL (ref 0.1–0.9)
MONO%: 9.1 % (ref 0.0–14.0)
NEUT#: 5.9 10*3/uL (ref 1.5–6.5)
NEUT%: 71.9 % (ref 39.0–75.0)
Platelets: 212 10*3/uL (ref 140–400)
RBC: 3.6 10*6/uL — ABNORMAL LOW (ref 4.20–5.82)
RDW: 19.5 % — AB (ref 11.0–14.6)
WBC: 8.3 10*3/uL (ref 4.0–10.3)
lymph#: 1.2 10*3/uL (ref 0.9–3.3)

## 2014-01-14 LAB — TSH CHCC: TSH: 1.407 m(IU)/L (ref 0.320–4.118)

## 2014-01-14 MED ORDER — SODIUM CHLORIDE 0.9 % IV SOLN
3.0000 mg/kg | Freq: Once | INTRAVENOUS | Status: AC
  Administered 2014-01-14: 260 mg via INTRAVENOUS
  Filled 2014-01-14: qty 26

## 2014-01-14 MED ORDER — SODIUM CHLORIDE 0.9 % IV SOLN
Freq: Once | INTRAVENOUS | Status: AC
  Administered 2014-01-14: 15:00:00 via INTRAVENOUS

## 2014-01-14 NOTE — Patient Instructions (Signed)
Nivolumab injection What is this medicine? NIVOLUMAB (nye VOL ue mab) is used to treat certain types of melanoma and lung cancer. This medicine may be used for other purposes; ask your health care provider or pharmacist if you have questions. COMMON BRAND NAME(S): Opdivo What should I tell my health care provider before I take this medicine? They need to know if you have any of these conditions: -eye disease, vision problems -history of pancreatitis -immune system problems -inflammatory bowel disease -kidney disease -liver disease -lung disease -lupus -myasthenia gravis -multiple sclerosis -organ transplant -stomach or intestine problems -thyroid disease -tingling of the fingers or toes, or other nerve disorder -an unusual or allergic reaction to nivolumab, other medicines, foods, dyes, or preservatives -pregnant or trying to get pregnant -breast-feeding How should I use this medicine? This medicine is for infusion into a vein. It is given by a health care professional in a hospital or clinic setting. A special MedGuide will be given to you before each treatment. Be sure to read this information carefully each time. Talk to your pediatrician regarding the use of this medicine in children. Special care may be needed. Overdosage: If you think you've taken too much of this medicine contact a poison control center or emergency room at once. Overdosage: If you think you have taken too much of this medicine contact a poison control center or emergency room at once. NOTE: This medicine is only for you. Do not share this medicine with others. What if I miss a dose? It is important not to miss your dose. Call your doctor or health care professional if you are unable to keep an appointment. What may interact with this medicine? Interactions have not been studied. This list may not describe all possible interactions. Give your health care provider a list of all the medicines, herbs,  non-prescription drugs, or dietary supplements you use. Also tell them if you smoke, drink alcohol, or use illegal drugs. Some items may interact with your medicine. What should I watch for while using this medicine? Tell your doctor or healthcare professional if your symptoms do not start to get better or if they get worse. Your condition will be monitored carefully while you are receiving this medicine. You may need blood work done while you are taking this medicine. What side effects may I notice from receiving this medicine? Side effects that you should report to your doctor or health care professional as soon as possible: -allergic reactions like skin rash, itching or hives, swelling of the face, lips, or tongue -black, tarry stools -bloody or watery diarrhea -changes in vision -chills -cough -depressed mood -eye pain -feeling anxious -fever -general ill feeling or flu-like symptoms -hair loss -loss of appetite -low blood counts - this medicine may decrease the number of white blood cells, red blood cells and platelets. You may be at increased risk for infections and bleeding -pain, tingling, numbness in the hands or feet -redness, blistering, peeling or loosening of the skin, including inside the mouth -red pinpoint spots on skin -signs of decreased platelets or bleeding - bruising, pinpoint red spots on the skin, black, tarry stools, blood in the urine -signs of decreased red blood cells - unusually weak or tired, feeling faint or lightheaded, falls -signs of infection - fever or chills, cough, sore throat, pain or trouble passing urine -signs and symptoms of a dangerous change in heartbeat or heart rhythm like chest pain; dizziness; fast or irregular heartbeat; palpitations; feeling faint or lightheaded, falls; breathing problems -signs  and symptoms of high blood sugar such as dizziness; dry mouth; dry skin; fruity breath; nausea; stomach pain; increased hunger or thirst; increased  urination -signs and symptoms of kidney injury like trouble passing urine or change in the amount of urine -signs and symptoms of liver injury like dark yellow or brown urine; general ill feeling or flu-like symptoms; light-colored stools; loss of appetite; nausea; right upper belly pain; unusually weak or tired; yellowing of the eyes or skin -signs and symptoms of increased potassium like muscle weakness; chest pain; or fast, irregular heartbeat -signs and symptoms of low potassium like muscle cramps or muscle pain; chest pain; dizziness; feeling faint or lightheaded, falls; palpitations; breathing problems; or fast, irregular heartbeat -swelling of the ankles, feet, hands -weight gainSide effects that usually do not require medical attention (report to your doctor or health care professional if they continue or are bothersome): -constipation -general ill feeling or flu-like symptoms -hair loss -loss of appetite -nausea, vomiting This list may not describe all possible side effects. Call your doctor for medical advice about side effects. You may report side effects to FDA at 1-800-FDA-1088. Where should I keep my medicine? This drug is given in a hospital or clinic and will not be stored at home. NOTE: This sheet is a summary. It may not cover all possible information. If you have questions about this medicine, talk to your doctor, pharmacist, or health care provider.  2015, [employer]. (2013-08-05 13:18:19)

## 2014-01-15 ENCOUNTER — Telehealth: Payer: Self-pay | Admitting: *Deleted

## 2014-01-15 NOTE — Telephone Encounter (Signed)
Called pt at home and left message on voice mail requesting a call back from pt for post chemo follow up.

## 2014-01-20 ENCOUNTER — Telehealth: Payer: Self-pay

## 2014-01-20 NOTE — Telephone Encounter (Signed)
Message copied by Bevelyn Ngo on Mon Jan 20, 2014  2:17 PM ------      Message from: Awilda Metro E      Created: Mon Jan 20, 2014  1:15 PM       Abnormal results, please call  and notify patient to increase dietary intake of potassium rich foods ------

## 2014-01-20 NOTE — Telephone Encounter (Signed)
Called patient and informed him his potassium was low at 3.4 and to increase his foods in potassium. Patient verbalized understanding, denies any questions or concerns.

## 2014-01-21 ENCOUNTER — Other Ambulatory Visit: Payer: Medicare Other

## 2014-01-28 ENCOUNTER — Other Ambulatory Visit: Payer: Medicare Other

## 2014-01-28 ENCOUNTER — Encounter: Payer: Self-pay | Admitting: Internal Medicine

## 2014-01-28 ENCOUNTER — Ambulatory Visit (HOSPITAL_BASED_OUTPATIENT_CLINIC_OR_DEPARTMENT_OTHER): Payer: Medicare Other

## 2014-01-28 ENCOUNTER — Other Ambulatory Visit (HOSPITAL_BASED_OUTPATIENT_CLINIC_OR_DEPARTMENT_OTHER): Payer: Medicare Other

## 2014-01-28 ENCOUNTER — Ambulatory Visit (HOSPITAL_BASED_OUTPATIENT_CLINIC_OR_DEPARTMENT_OTHER): Payer: Medicare Other | Admitting: Internal Medicine

## 2014-01-28 VITALS — BP 158/81 | HR 88 | Temp 97.9°F | Resp 18 | Ht 68.5 in | Wt 197.7 lb

## 2014-01-28 DIAGNOSIS — C3430 Malignant neoplasm of lower lobe, unspecified bronchus or lung: Secondary | ICD-10-CM

## 2014-01-28 DIAGNOSIS — C3410 Malignant neoplasm of upper lobe, unspecified bronchus or lung: Secondary | ICD-10-CM

## 2014-01-28 DIAGNOSIS — Z5112 Encounter for antineoplastic immunotherapy: Secondary | ICD-10-CM

## 2014-01-28 DIAGNOSIS — C3490 Malignant neoplasm of unspecified part of unspecified bronchus or lung: Secondary | ICD-10-CM

## 2014-01-28 LAB — CBC WITH DIFFERENTIAL/PLATELET
BASO%: 0.4 % (ref 0.0–2.0)
Basophils Absolute: 0 10*3/uL (ref 0.0–0.1)
EOS%: 6 % (ref 0.0–7.0)
Eosinophils Absolute: 0.4 10*3/uL (ref 0.0–0.5)
HCT: 36.5 % — ABNORMAL LOW (ref 38.4–49.9)
HGB: 12.2 g/dL — ABNORMAL LOW (ref 13.0–17.1)
LYMPH#: 1.1 10*3/uL (ref 0.9–3.3)
LYMPH%: 15.3 % (ref 14.0–49.0)
MCH: 33.2 pg (ref 27.2–33.4)
MCHC: 33.4 g/dL (ref 32.0–36.0)
MCV: 99.5 fL — ABNORMAL HIGH (ref 79.3–98.0)
MONO#: 0.5 10*3/uL (ref 0.1–0.9)
MONO%: 7.5 % (ref 0.0–14.0)
NEUT#: 5 10*3/uL (ref 1.5–6.5)
NEUT%: 70.8 % (ref 39.0–75.0)
NRBC: 0 % (ref 0–0)
Platelets: 167 10*3/uL (ref 140–400)
RBC: 3.67 10*6/uL — AB (ref 4.20–5.82)
RDW: 16.1 % — ABNORMAL HIGH (ref 11.0–14.6)
WBC: 7.1 10*3/uL (ref 4.0–10.3)

## 2014-01-28 LAB — COMPREHENSIVE METABOLIC PANEL (CC13)
ALK PHOS: 105 U/L (ref 40–150)
ALT: 15 U/L (ref 0–55)
AST: 19 U/L (ref 5–34)
Albumin: 3.5 g/dL (ref 3.5–5.0)
Anion Gap: 10 mEq/L (ref 3–11)
BUN: 13.8 mg/dL (ref 7.0–26.0)
CALCIUM: 9.4 mg/dL (ref 8.4–10.4)
CHLORIDE: 105 meq/L (ref 98–109)
CO2: 26 mEq/L (ref 22–29)
CREATININE: 1.4 mg/dL — AB (ref 0.7–1.3)
Glucose: 90 mg/dl (ref 70–140)
POTASSIUM: 3.5 meq/L (ref 3.5–5.1)
Sodium: 141 mEq/L (ref 136–145)
Total Bilirubin: 0.34 mg/dL (ref 0.20–1.20)
Total Protein: 7.7 g/dL (ref 6.4–8.3)

## 2014-01-28 LAB — MAGNESIUM (CC13): MAGNESIUM: 1.9 mg/dL (ref 1.5–2.5)

## 2014-01-28 MED ORDER — SODIUM CHLORIDE 0.9 % IV SOLN
3.0000 mg/kg | Freq: Once | INTRAVENOUS | Status: AC
  Administered 2014-01-28: 260 mg via INTRAVENOUS
  Filled 2014-01-28: qty 26

## 2014-01-28 MED ORDER — SODIUM CHLORIDE 0.9 % IV SOLN
Freq: Once | INTRAVENOUS | Status: AC
  Administered 2014-01-28: 11:00:00 via INTRAVENOUS

## 2014-01-28 NOTE — Patient Instructions (Signed)
Nivolumab injection What is this medicine? NIVOLUMAB (nye VOL ue mab) is used to treat certain types of melanoma and lung cancer. This medicine may be used for other purposes; ask your health care provider or pharmacist if you have questions. COMMON BRAND NAME(S): Opdivo What should I tell my health care provider before I take this medicine? They need to know if you have any of these conditions: -eye disease, vision problems -history of pancreatitis -immune system problems -inflammatory bowel disease -kidney disease -liver disease -lung disease -lupus -myasthenia gravis -multiple sclerosis -organ transplant -stomach or intestine problems -thyroid disease -tingling of the fingers or toes, or other nerve disorder -an unusual or allergic reaction to nivolumab, other medicines, foods, dyes, or preservatives -pregnant or trying to get pregnant -breast-feeding How should I use this medicine? This medicine is for infusion into a vein. It is given by a health care professional in a hospital or clinic setting. A special MedGuide will be given to you before each treatment. Be sure to read this information carefully each time. Talk to your pediatrician regarding the use of this medicine in children. Special care may be needed. Overdosage: If you think you've taken too much of this medicine contact a poison control center or emergency room at once. Overdosage: If you think you have taken too much of this medicine contact a poison control center or emergency room at once. NOTE: This medicine is only for you. Do not share this medicine with others. What if I miss a dose? It is important not to miss your dose. Call your doctor or health care professional if you are unable to keep an appointment. What may interact with this medicine? Interactions have not been studied. This list may not describe all possible interactions. Give your health care provider a list of all the medicines, herbs,  non-prescription drugs, or dietary supplements you use. Also tell them if you smoke, drink alcohol, or use illegal drugs. Some items may interact with your medicine. What should I watch for while using this medicine? Tell your doctor or healthcare professional if your symptoms do not start to get better or if they get worse. Your condition will be monitored carefully while you are receiving this medicine. You may need blood work done while you are taking this medicine. What side effects may I notice from receiving this medicine? Side effects that you should report to your doctor or health care professional as soon as possible: -allergic reactions like skin rash, itching or hives, swelling of the face, lips, or tongue -black, tarry stools -bloody or watery diarrhea -changes in vision -chills -cough -depressed mood -eye pain -feeling anxious -fever -general ill feeling or flu-like symptoms -hair loss -loss of appetite -low blood counts - this medicine may decrease the number of white blood cells, red blood cells and platelets. You may be at increased risk for infections and bleeding -pain, tingling, numbness in the hands or feet -redness, blistering, peeling or loosening of the skin, including inside the mouth -red pinpoint spots on skin -signs of decreased platelets or bleeding - bruising, pinpoint red spots on the skin, black, tarry stools, blood in the urine -signs of decreased red blood cells - unusually weak or tired, feeling faint or lightheaded, falls -signs of infection - fever or chills, cough, sore throat, pain or trouble passing urine -signs and symptoms of a dangerous change in heartbeat or heart rhythm like chest pain; dizziness; fast or irregular heartbeat; palpitations; feeling faint or lightheaded, falls; breathing problems -signs  and symptoms of high blood sugar such as dizziness; dry mouth; dry skin; fruity breath; nausea; stomach pain; increased hunger or thirst; increased  urination -signs and symptoms of kidney injury like trouble passing urine or change in the amount of urine -signs and symptoms of liver injury like dark yellow or brown urine; general ill feeling or flu-like symptoms; light-colored stools; loss of appetite; nausea; right upper belly pain; unusually weak or tired; yellowing of the eyes or skin -signs and symptoms of increased potassium like muscle weakness; chest pain; or fast, irregular heartbeat -signs and symptoms of low potassium like muscle cramps or muscle pain; chest pain; dizziness; feeling faint or lightheaded, falls; palpitations; breathing problems; or fast, irregular heartbeat -swelling of the ankles, feet, hands -weight gainSide effects that usually do not require medical attention (report to your doctor or health care professional if they continue or are bothersome): -constipation -general ill feeling or flu-like symptoms -hair loss -loss of appetite -nausea, vomiting This list may not describe all possible side effects. Call your doctor for medical advice about side effects. You may report side effects to FDA at 1-800-FDA-1088. Where should I keep my medicine? This drug is given in a hospital or clinic and will not be stored at home. NOTE: This sheet is a summary. It may not cover all possible information. If you have questions about this medicine, talk to your doctor, pharmacist, or health care provider.  2015, [employer]. (2013-08-05 13:18:19)

## 2014-01-28 NOTE — Progress Notes (Signed)
[facility] [phone]   [phone]  OFFICE PROGRESS NOTE  [name], MD [place] [address]  DIAGNOSIS: Stage IIIB (T2a., N3, M0) non-small cell lung cancer consistent with invasive adenocarcinoma with negative ALK gene translocation and negative EGFR mutation diagnosed in October of 2013   PRIOR THERAPY:  1) Concurrent chemoradiation with weekly carboplatin for AUC of 2 and paclitaxel 45 mg/M2, last dose was given 06/11/2012 with partial response.  2) Consolidation chemotherapy with carboplatin for AUC of 5 and Alimta 500 mg/M2 every 3 weeks. Status post 3 cycles last dose was given 09/12/2012 with stable disease.  3) Systemic chemotherapy again with carboplatin for AUC of 5 and Alimta 500 mg/M2 every 3 weeks. First cycle expected on 11/13/2013. Status post 3 cycles, discontinued secondary to disease progression. Carboplatin was discontinued starting from cycle #2 secondary to hypersensitivity reaction.  CURRENT THERAPY: Immunotherapy with single agent Nivolumab 3 MG/TG every 2 weeks status post 1 cycle.   CHEMOTHERAPY INTENT: Palliative  CURRENT # OF CHEMOTHERAPY CYCLES: 2  CURRENT ANTIEMETICS: N/A  CURRENT SMOKING STATUS: Former Smoker  ORAL CHEMOTHERAPY AND CONSENT: None  CURRENT BISPHOSPHONATES USE: None  PAIN MANAGEMENT: 0/10  NARCOTICS INDUCED CONSTIPATION: None  LIVING WILL AND CODE STATUS: no code Blue  INTERVAL HISTORY: Logan Russell 79 y.o. male returns to the clinic today for three-month follow up visit accompanied by his wife. He is currently undergoing chemotherapy with Nivolumab, status post 1 cycle. He tolerated the first cycle of his treatment fairly well with no significant adverse effects. He denied having any nausea or vomiting, no fever or chills, no diarrhea or skin rash. The patient denied having any significant weight loss or night sweats. He has no chest pain but continues to  have shortness breath with exertion with no cough or hemoptysis. He is here today for cycle #2.  MEDICAL HISTORY: Past Medical History  Diagnosis Date  . Hypertension   . History of radiation therapy 05/02/12-06/19/12    left lung mediastinal lymph nodes 66Gy/66fx  . Lung cancer 04/12/2012    ALLERGIES:  has No Known Allergies.  MEDICATIONS:  Current Outpatient Prescriptions  Medication Sig Dispense Refill  . albuterol (PROVENTIL HFA;VENTOLIN HFA) 108 (90 BASE) MCG/ACT inhaler Inhale 2 puffs into the lungs every 6 (six) hours as needed for wheezing or shortness of breath.  1 Inhaler  2  . dexamethasone (DECADRON) 4 MG tablet 4 Milligram by mouth twice a day the day before, day of and day after the chemotherapy  40 tablet  1  . folic acid (FOLVITE) 1 MG tablet Take 1 tablet (1 mg total) by mouth daily.  30 tablet  4  . guaiFENesin (MUCINEX) 600 MG 12 hr tablet Take 1,200 mg by mouth 2 (two) times daily.      Marland Kitchen ibuprofen (ADVIL,MOTRIN) 200 MG tablet Take 400 mg by mouth every 6 (six) hours as needed.      Marland Kitchen PNEUMOVAX 23 25 MCG/0.5ML injection       . potassium chloride SA (K-DUR,KLOR-CON) 20 MEQ tablet Take 1 tablet by mouth for 7 days  7 tablet  0  . Spacer/Aero-Holding Chambers (E-Z SPACER) inhaler Use as instructed  1 each  2  . traMADol (ULTRAM) 50 MG tablet       . triamterene-hydrochlorothiazide (MAXZIDE) 75-50 MG per tablet Take 1 tablet by mouth daily.       No current facility-administered medications for this visit.  REVIEW OF SYSTEMS:  Constitutional: positive for fatigue Eyes: negative Ears, nose, mouth, throat, and face: negative Respiratory: positive for dyspnea on exertion Cardiovascular: negative Gastrointestinal: negative Genitourinary:negative Integument/breast: negative Hematologic/lymphatic: negative Musculoskeletal:negative Neurological: negative Behavioral/Psych: negative Endocrine: negative Allergic/Immunologic: negative   PHYSICAL EXAMINATION:  General appearance: alert, cooperative and no distress Head: Normocephalic, without obvious abnormality, atraumatic Neck: no adenopathy Lymph nodes: Cervical, supraclavicular, and axillary nodes normal. Resp: clear to auscultation bilaterally Cardio: regular rate and rhythm, S1, S2 normal, no murmur, click, rub or gallop GI: soft, non-tender; bowel sounds normal; no masses,  no organomegaly Extremities: extremities normal, atraumatic, no cyanosis or edema  ECOG PERFORMANCE STATUS: 1 - Symptomatic but completely ambulatory  Blood pressure 158/81, pulse 88, temperature 97.9 F (36.6 C), temperature source Oral, resp. rate 18, height 5' 8.5" (1.74 m), weight 197 lb 11.2 oz (89.676 kg).  LABORATORY DATA: Lab Results  Component Value Date   WBC 7.1 01/28/2014   HGB 12.2* 01/28/2014   HCT 36.5* 01/28/2014   MCV 99.5* 01/28/2014   PLT 167 01/28/2014      Chemistry      Component Value Date/Time   NA 141 01/28/2014 0914   K 3.5 01/28/2014 0914   CL 106 10/08/2012 1028   CO2 26 01/28/2014 0914   BUN 13.8 01/28/2014 0914   CREATININE 1.4* 01/28/2014 0914      Component Value Date/Time   CALCIUM 9.4 01/28/2014 0914   ALKPHOS 105 01/28/2014 0914   AST 19 01/28/2014 0914   ALT 15 01/28/2014 0914   BILITOT 0.34 01/28/2014 0914       RADIOGRAPHIC STUDIES:  ASSESSMENT AND PLAN: this is a very pleasant 79 years old white male white male with history of stage IIIB non-small cell lung cancer status post concurrent chemoradiation followed by consolidation chemotherapy. He had evidence for disease recurrence and the patient was started on systemic chemotherapy with carboplatin and Alimta discontinued secondary to disease progression. He is currently undergoing immunotherapy with single agent Nivolumab status post 1 cycle. He is tolerating his treatment fairly well with no significant adverse effects. I recommended for the patient to proceed with cycle #2 today as scheduled.  he would come back for follow up visit in 2 weeks for  evaluation before starting cycle #3. He was advised to call immediately if he has any concerning symptoms in the interval. The patient voices understanding of current disease status and treatment options and is in agreement with the current care plan.  All questions were answered. The patient knows to call the clinic with any problems, questions or concerns. We can certainly see the patient much sooner if necessary.  Disclaimer: This note was dictated with voice recognition software. Similar sounding words can inadvertently be transcribed and may not be corrected upon review.

## 2014-01-29 ENCOUNTER — Telehealth: Payer: Self-pay | Admitting: Internal Medicine

## 2014-01-29 NOTE — Telephone Encounter (Signed)
Pt confirmed labs/md visit went to chemo advised I would mail it to him pt was ok with it.Marland Kitchen..KJ

## 2014-02-11 ENCOUNTER — Telehealth: Payer: Self-pay | Admitting: Internal Medicine

## 2014-02-11 ENCOUNTER — Other Ambulatory Visit (HOSPITAL_BASED_OUTPATIENT_CLINIC_OR_DEPARTMENT_OTHER): Payer: Medicare Other

## 2014-02-11 ENCOUNTER — Encounter: Payer: Self-pay | Admitting: Internal Medicine

## 2014-02-11 ENCOUNTER — Ambulatory Visit (HOSPITAL_BASED_OUTPATIENT_CLINIC_OR_DEPARTMENT_OTHER): Payer: Medicare Other

## 2014-02-11 ENCOUNTER — Ambulatory Visit (HOSPITAL_BASED_OUTPATIENT_CLINIC_OR_DEPARTMENT_OTHER): Payer: Medicare Other | Admitting: Internal Medicine

## 2014-02-11 VITALS — BP 143/83 | HR 95 | Temp 97.8°F | Resp 18 | Ht 68.5 in | Wt 200.2 lb

## 2014-02-11 DIAGNOSIS — C3490 Malignant neoplasm of unspecified part of unspecified bronchus or lung: Secondary | ICD-10-CM

## 2014-02-11 DIAGNOSIS — R0989 Other specified symptoms and signs involving the circulatory and respiratory systems: Secondary | ICD-10-CM

## 2014-02-11 DIAGNOSIS — R0609 Other forms of dyspnea: Secondary | ICD-10-CM

## 2014-02-11 DIAGNOSIS — C3430 Malignant neoplasm of lower lobe, unspecified bronchus or lung: Secondary | ICD-10-CM

## 2014-02-11 DIAGNOSIS — C3410 Malignant neoplasm of upper lobe, unspecified bronchus or lung: Secondary | ICD-10-CM

## 2014-02-11 DIAGNOSIS — Z5112 Encounter for antineoplastic immunotherapy: Secondary | ICD-10-CM

## 2014-02-11 LAB — COMPREHENSIVE METABOLIC PANEL (CC13)
ALK PHOS: 112 U/L (ref 40–150)
ALT: 20 U/L (ref 0–55)
ANION GAP: 12 meq/L — AB (ref 3–11)
AST: 27 U/L (ref 5–34)
Albumin: 3.6 g/dL (ref 3.5–5.0)
BILIRUBIN TOTAL: 0.4 mg/dL (ref 0.20–1.20)
BUN: 28.7 mg/dL — ABNORMAL HIGH (ref 7.0–26.0)
CO2: 24 mEq/L (ref 22–29)
Calcium: 9.2 mg/dL (ref 8.4–10.4)
Chloride: 103 mEq/L (ref 98–109)
Creatinine: 1.6 mg/dL — ABNORMAL HIGH (ref 0.7–1.3)
GLUCOSE: 84 mg/dL (ref 70–140)
Potassium: 3.9 mEq/L (ref 3.5–5.1)
SODIUM: 139 meq/L (ref 136–145)
TOTAL PROTEIN: 7.8 g/dL (ref 6.4–8.3)

## 2014-02-11 LAB — CBC WITH DIFFERENTIAL/PLATELET
BASO%: 0.9 % (ref 0.0–2.0)
Basophils Absolute: 0.1 10*3/uL (ref 0.0–0.1)
EOS%: 4.8 % (ref 0.0–7.0)
Eosinophils Absolute: 0.3 10*3/uL (ref 0.0–0.5)
HCT: 39.4 % (ref 38.4–49.9)
HGB: 12.9 g/dL — ABNORMAL LOW (ref 13.0–17.1)
LYMPH%: 16 % (ref 14.0–49.0)
MCH: 32.8 pg (ref 27.2–33.4)
MCHC: 32.7 g/dL (ref 32.0–36.0)
MCV: 100.5 fL — ABNORMAL HIGH (ref 79.3–98.0)
MONO#: 0.5 10*3/uL (ref 0.1–0.9)
MONO%: 8.7 % (ref 0.0–14.0)
NEUT#: 4.4 10*3/uL (ref 1.5–6.5)
NEUT%: 69.6 % (ref 39.0–75.0)
Platelets: 185 10*3/uL (ref 140–400)
RBC: 3.92 10*6/uL — AB (ref 4.20–5.82)
RDW: 16 % — ABNORMAL HIGH (ref 11.0–14.6)
WBC: 6.3 10*3/uL (ref 4.0–10.3)
lymph#: 1 10*3/uL (ref 0.9–3.3)

## 2014-02-11 MED ORDER — SODIUM CHLORIDE 0.9 % IV SOLN
3.0000 mg/kg | Freq: Once | INTRAVENOUS | Status: AC
  Administered 2014-02-11: 260 mg via INTRAVENOUS
  Filled 2014-02-11: qty 26

## 2014-02-11 MED ORDER — SODIUM CHLORIDE 0.9 % IV SOLN
Freq: Once | INTRAVENOUS | Status: AC
  Administered 2014-02-11: 13:00:00 via INTRAVENOUS

## 2014-02-11 NOTE — Progress Notes (Signed)
[facility] [phone]   [phone]) [phone]  OFFICE PROGRESS NOTE  [name], MD [place] [address]  DIAGNOSIS: Stage IIIB (T2a., N3, M0) non-small cell lung cancer consistent with invasive adenocarcinoma with negative ALK gene translocation and negative EGFR mutation diagnosed in October of 2013   PRIOR THERAPY:  1) Concurrent chemoradiation with weekly carboplatin for AUC of 2 and paclitaxel 45 mg/M2, last dose was given 06/11/2012 with partial response.  2) Consolidation chemotherapy with carboplatin for AUC of 5 and Alimta 500 mg/M2 every 3 weeks. Status post 3 cycles last dose was given 09/12/2012 with stable disease.  3) Systemic chemotherapy again with carboplatin for AUC of 5 and Alimta 500 mg/M2 every 3 weeks. First cycle expected on 11/13/2013. Status post 3 cycles, discontinued secondary to disease progression. Carboplatin was discontinued starting from cycle #2 secondary to hypersensitivity reaction.  CURRENT THERAPY: Immunotherapy with single agent Nivolumab 3 MG/TG every 2 weeks status post 2 cycles.   CHEMOTHERAPY INTENT: Palliative  CURRENT # OF CHEMOTHERAPY CYCLES: 3  CURRENT ANTIEMETICS: N/A  CURRENT SMOKING STATUS: Former Smoker  ORAL CHEMOTHERAPY AND CONSENT: None  CURRENT BISPHOSPHONATES USE: None  PAIN MANAGEMENT: 0/10  NARCOTICS INDUCED CONSTIPATION: None  LIVING WILL AND CODE STATUS: no code Blue  INTERVAL HISTORY: Logan Russell 79 y.o. male returns to the clinic today for three-month follow up visit accompanied by his wife. He is tolerating his treatment with Nivolumab fairly well with no significant adverse effects. He denied having any nausea or vomiting, no fever or chills, no diarrhea or skin rash. The patient denied having any significant weight loss or night sweats. He has no chest pain but continues to have shortness breath with exertion with no cough or hemoptysis. He is here  today for cycle #3.  MEDICAL HISTORY: Past Medical History  Diagnosis Date  . Hypertension   . History of radiation therapy 05/02/12-06/19/12    left lung mediastinal lymph nodes 66Gy/73fx  . Lung cancer 04/12/2012    ALLERGIES:  has No Known Allergies.  MEDICATIONS:  Current Outpatient Prescriptions  Medication Sig Dispense Refill  . albuterol (PROVENTIL HFA;VENTOLIN HFA) 108 (90 BASE) MCG/ACT inhaler Inhale 2 puffs into the lungs every 6 (six) hours as needed for wheezing or shortness of breath.  1 Inhaler  2  . dexamethasone (DECADRON) 4 MG tablet 4 Milligram by mouth twice a day the day before, day of and day after the chemotherapy  40 tablet  1  . folic acid (FOLVITE) 1 MG tablet Take 1 tablet (1 mg total) by mouth daily.  30 tablet  4  . guaiFENesin (MUCINEX) 600 MG 12 hr tablet Take 1,200 mg by mouth 2 (two) times daily.      Marland Kitchen ibuprofen (ADVIL,MOTRIN) 200 MG tablet Take 400 mg by mouth every 6 (six) hours as needed.      Marland Kitchen PNEUMOVAX 23 25 MCG/0.5ML injection       . potassium chloride SA (K-DUR,KLOR-CON) 20 MEQ tablet Take 1 tablet by mouth for 7 days  7 tablet  0  . Spacer/Aero-Holding Chambers (E-Z SPACER) inhaler Use as instructed  1 each  2  . traMADol (ULTRAM) 50 MG tablet       . triamterene-hydrochlorothiazide (MAXZIDE) 75-50 MG per tablet Take 1 tablet by mouth daily.       No current facility-administered medications for this visit.    REVIEW OF SYSTEMS:  Constitutional: positive for fatigue Eyes: negative Ears,  nose, mouth, throat, and face: negative Respiratory: positive for dyspnea on exertion Cardiovascular: negative Gastrointestinal: negative Genitourinary:negative Integument/breast: negative Hematologic/lymphatic: negative Musculoskeletal:negative Neurological: negative Behavioral/Psych: negative Endocrine: negative Allergic/Immunologic: negative   PHYSICAL EXAMINATION: General appearance: alert, cooperative and no distress Head: Normocephalic,  without obvious abnormality, atraumatic Neck: no adenopathy Lymph nodes: Cervical, supraclavicular, and axillary nodes normal. Resp: clear to auscultation bilaterally Cardio: regular rate and rhythm, S1, S2 normal, no murmur, click, rub or gallop GI: soft, non-tender; bowel sounds normal; no masses,  no organomegaly Extremities: extremities normal, atraumatic, no cyanosis or edema  ECOG PERFORMANCE STATUS: 1 - Symptomatic but completely ambulatory  Blood pressure 143/83, pulse 95, temperature 97.8 F (36.6 C), temperature source Oral, resp. rate 18, height 5' 8.5" (1.74 m), weight 200 lb 3.2 oz (90.81 kg), SpO2 100.00%.  LABORATORY DATA: Lab Results  Component Value Date   WBC 6.3 02/11/2014   HGB 12.9* 02/11/2014   HCT 39.4 02/11/2014   MCV 100.5* 02/11/2014   PLT 185 02/11/2014      Chemistry      Component Value Date/Time   NA 141 01/28/2014 0914   K 3.5 01/28/2014 0914   CL 106 10/08/2012 1028   CO2 26 01/28/2014 0914   BUN 13.8 01/28/2014 0914   CREATININE 1.4* 01/28/2014 0914      Component Value Date/Time   CALCIUM 9.4 01/28/2014 0914   ALKPHOS 105 01/28/2014 0914   AST 19 01/28/2014 0914   ALT 15 01/28/2014 0914   BILITOT 0.34 01/28/2014 0914       RADIOGRAPHIC STUDIES:  ASSESSMENT AND PLAN: this is a very pleasant 79 years old white male with history of stage IIIB non-small cell lung cancer status post concurrent chemoradiation followed by consolidation chemotherapy. He had evidence for disease recurrence and the patient was started on systemic chemotherapy with carboplatin and Alimta discontinued secondary to disease progression. He is currently undergoing immunotherapy with single agent Nivolumab status post 2 cycles. He is tolerating his treatment fairly well with no significant adverse effects. I recommended for the patient to proceed with cycle #3 today as scheduled.  he would come back for follow up visit in 2 weeks for evaluation before starting cycle #4. He was advised to call  immediately if he has any concerning symptoms in the interval. The patient voices understanding of current disease status and treatment options and is in agreement with the current care plan.  All questions were answered. The patient knows to call the clinic with any problems, questions or concerns. We can certainly see the patient much sooner if necessary.  Disclaimer: This note was dictated with voice recognition software. Similar sounding words can inadvertently be transcribed and may not be corrected upon review.

## 2014-02-11 NOTE — Progress Notes (Signed)
Okay to proceed with treatment today with creatinine of 1.6 per Dr. Julien Nordmann.

## 2014-02-11 NOTE — Patient Instructions (Signed)
[facility] Discharge Instructions for Patients Receiving Chemotherapy  Today you received the following chemotherapy agents: Nivolumab.  To help prevent nausea and vomiting after your treatment, we encourage you to take your nausea medication as prescribed.   If you develop nausea and vomiting that is not controlled by your nausea medication, call the clinic.   BELOW ARE SYMPTOMS THAT SHOULD BE REPORTED IMMEDIATELY:  *FEVER GREATER THAN 100.5 F  *CHILLS WITH OR WITHOUT FEVER  NAUSEA AND VOMITING THAT IS NOT CONTROLLED WITH YOUR NAUSEA MEDICATION  *UNUSUAL SHORTNESS OF BREATH  *UNUSUAL BRUISING OR BLEEDING  TENDERNESS IN MOUTH AND THROAT WITH OR WITHOUT PRESENCE OF ULCERS  *URINARY PROBLEMS  *BOWEL PROBLEMS  UNUSUAL RASH Items with * indicate a potential emergency and should be followed up as soon as possible.  Feel free to call the clinic you have any questions or concerns. The clinic phone number is [phone].

## 2014-02-11 NOTE — Telephone Encounter (Signed)
gv adn pritned appt sched and avs for pt for Sept....sed added tx.

## 2014-02-25 ENCOUNTER — Ambulatory Visit (HOSPITAL_BASED_OUTPATIENT_CLINIC_OR_DEPARTMENT_OTHER): Payer: Medicare Other

## 2014-02-25 ENCOUNTER — Ambulatory Visit (HOSPITAL_BASED_OUTPATIENT_CLINIC_OR_DEPARTMENT_OTHER): Payer: Medicare Other | Admitting: Nurse Practitioner

## 2014-02-25 ENCOUNTER — Encounter: Payer: Self-pay | Admitting: Nurse Practitioner

## 2014-02-25 ENCOUNTER — Other Ambulatory Visit (HOSPITAL_BASED_OUTPATIENT_CLINIC_OR_DEPARTMENT_OTHER): Payer: Medicare Other

## 2014-02-25 VITALS — BP 148/91 | HR 85 | Temp 98.1°F | Resp 18 | Ht 68.5 in | Wt 200.4 lb

## 2014-02-25 DIAGNOSIS — I10 Essential (primary) hypertension: Secondary | ICD-10-CM

## 2014-02-25 DIAGNOSIS — C3490 Malignant neoplasm of unspecified part of unspecified bronchus or lung: Secondary | ICD-10-CM

## 2014-02-25 DIAGNOSIS — N189 Chronic kidney disease, unspecified: Secondary | ICD-10-CM

## 2014-02-25 DIAGNOSIS — E8809 Other disorders of plasma-protein metabolism, not elsewhere classified: Secondary | ICD-10-CM

## 2014-02-25 DIAGNOSIS — C3410 Malignant neoplasm of upper lobe, unspecified bronchus or lung: Secondary | ICD-10-CM

## 2014-02-25 DIAGNOSIS — C3430 Malignant neoplasm of lower lobe, unspecified bronchus or lung: Secondary | ICD-10-CM

## 2014-02-25 DIAGNOSIS — J069 Acute upper respiratory infection, unspecified: Secondary | ICD-10-CM

## 2014-02-25 DIAGNOSIS — Z5112 Encounter for antineoplastic immunotherapy: Secondary | ICD-10-CM

## 2014-02-25 DIAGNOSIS — Z79899 Other long term (current) drug therapy: Secondary | ICD-10-CM

## 2014-02-25 LAB — COMPREHENSIVE METABOLIC PANEL (CC13)
ALBUMIN: 3.3 g/dL — AB (ref 3.5–5.0)
ALK PHOS: 119 U/L (ref 40–150)
ALT: 10 U/L (ref 0–55)
AST: 15 U/L (ref 5–34)
Anion Gap: 9 mEq/L (ref 3–11)
BUN: 20.5 mg/dL (ref 7.0–26.0)
CO2: 27 mEq/L (ref 22–29)
Calcium: 9.4 mg/dL (ref 8.4–10.4)
Chloride: 103 mEq/L (ref 98–109)
Creatinine: 1.4 mg/dL — ABNORMAL HIGH (ref 0.7–1.3)
Glucose: 106 mg/dl (ref 70–140)
POTASSIUM: 3.6 meq/L (ref 3.5–5.1)
SODIUM: 140 meq/L (ref 136–145)
TOTAL PROTEIN: 7.3 g/dL (ref 6.4–8.3)
Total Bilirubin: 0.46 mg/dL (ref 0.20–1.20)

## 2014-02-25 LAB — CBC WITH DIFFERENTIAL/PLATELET
BASO%: 0.8 % (ref 0.0–2.0)
BASOS ABS: 0 10*3/uL (ref 0.0–0.1)
EOS%: 4 % (ref 0.0–7.0)
Eosinophils Absolute: 0.2 10*3/uL (ref 0.0–0.5)
HEMATOCRIT: 38.1 % — AB (ref 38.4–49.9)
HGB: 12.5 g/dL — ABNORMAL LOW (ref 13.0–17.1)
LYMPH#: 1.1 10*3/uL (ref 0.9–3.3)
LYMPH%: 19.2 % (ref 14.0–49.0)
MCH: 32.9 pg (ref 27.2–33.4)
MCHC: 32.8 g/dL (ref 32.0–36.0)
MCV: 100.3 fL — ABNORMAL HIGH (ref 79.3–98.0)
MONO#: 0.5 10*3/uL (ref 0.1–0.9)
MONO%: 8 % (ref 0.0–14.0)
NEUT#: 4 10*3/uL (ref 1.5–6.5)
NEUT%: 68 % (ref 39.0–75.0)
Platelets: 174 10*3/uL (ref 140–400)
RBC: 3.8 10*6/uL — ABNORMAL LOW (ref 4.20–5.82)
RDW: 14.5 % (ref 11.0–14.6)
WBC: 6 10*3/uL (ref 4.0–10.3)

## 2014-02-25 LAB — TSH CHCC: TSH: 1.619 m[IU]/L (ref 0.320–4.118)

## 2014-02-25 MED ORDER — SODIUM CHLORIDE 0.9 % IV SOLN
280.0000 mg | Freq: Once | INTRAVENOUS | Status: AC
  Administered 2014-02-25: 280 mg via INTRAVENOUS
  Filled 2014-02-25: qty 28

## 2014-02-25 MED ORDER — SODIUM CHLORIDE 0.9 % IV SOLN
Freq: Once | INTRAVENOUS | Status: AC
  Administered 2014-02-25: 13:00:00 via INTRAVENOUS

## 2014-02-25 NOTE — Progress Notes (Signed)
[facility]   Chief Complaint  Patient presents with  . Follow-up    HPI: Logan Russell 79 y.o. male diagnosed with lung cancer.  Patient is status post chemotherapy and radiation therapy.  Patient is currently undergoing immunotherapy with Nivolumab. .  Patient presents today to receive cycle 4 of his immunotherapy agent Nivolumab.  He states he has been tolerating this regimen fairly well.  He states that he has developed a very mild cold with the main symptoms of nasal congestion and an occasional dry cough.  He denies any fevers or chills.  He states his wife is at home with the same cold.  Patient denies any issues with either fatigue or appetite.  He denies any recent nausea, vomiting, or diarrhea.   HPI  ROS  Past Medical History  Diagnosis Date  . Hypertension   . History of radiation therapy 05/02/12-06/19/12    left lung mediastinal lymph nodes 66Gy/15fx  . Lung cancer 04/12/2012    History reviewed. No pertinent past surgical history.  has Lung cancer; Hypertension; URI (upper respiratory infection); Chronic renal insufficiency; and Hypoalbuminemia on his problem list.     has No Known Allergies.    Medication List       This list is accurate as of: 02/25/14  6:09 PM.  Always use your most recent med list.               albuterol 108 (90 BASE) MCG/ACT inhaler  Commonly known as:  PROVENTIL HFA;VENTOLIN HFA  Inhale 2 puffs into the lungs every 6 (six) hours as needed for wheezing or shortness of breath.     folic acid 1 MG tablet  Commonly known as:  FOLVITE  Take 1 tablet (1 mg total) by mouth daily.     guaiFENesin 600 MG 12 hr tablet  Commonly known as:  MUCINEX  Take 1,200 mg by mouth 2 (two) times daily.     ibuprofen 200 MG tablet  Commonly known as:  ADVIL,MOTRIN  Take 400 mg by mouth every 6 (six) hours as needed.     triamterene-hydrochlorothiazide 75-50 MG per tablet  Commonly known as:  MAXZIDE  Take 1 tablet by mouth  daily.         PHYSICAL EXAMINATION  Blood pressure 148/91, pulse 85, temperature 98.1 F (36.7 C), temperature source Oral, resp. rate 18, height 5' 8.5" (1.74 m), weight 200 lb 6.4 oz (90.901 kg), SpO2 100.00%.  Physical Exam  Nursing note and vitals reviewed. Constitutional: He is oriented to person, place, and time and well-developed, well-nourished, and in no distress. No distress.  HENT:  Head: Normocephalic and atraumatic.  Mouth/Throat: Oropharynx is clear and moist. No oropharyngeal exudate.  Eyes: Conjunctivae are normal. Pupils are equal, round, and reactive to light. No scleral icterus.  Neck: Normal range of motion. Neck supple. No tracheal deviation present. No thyromegaly present.  Cardiovascular: Normal rate, regular rhythm and intact distal pulses.  Exam reveals no friction rub.   No murmur heard. Pulmonary/Chest: Effort normal. No respiratory distress. He has wheezes.  Patient does have some occasional bilateral wheeze noted on exam.  Patient also has an occasional dry cough.  No shortness of breath or respiratory distress noted on exam.  Abdominal: Soft. Bowel sounds are normal. He exhibits no distension and no mass. There is no tenderness. There is no rebound.  Musculoskeletal: Normal range of motion. He exhibits no edema and no tenderness.  Lymphadenopathy:    He has  no cervical adenopathy.  Neurological: He is alert and oriented to person, place, and time. Gait normal.  Skin: Skin is warm and dry. No rash noted. No erythema.  Psychiatric: Affect normal.    LABORATORY DATA:. CBC  Lab Results  Component Value Date   WBC 6.0 02/25/2014   RBC 3.80* 02/25/2014   HGB 12.5* 02/25/2014   HCT 38.1* 02/25/2014   PLT 174 02/25/2014   MCV 100.3* 02/25/2014   MCH 32.9 02/25/2014   MCHC 32.8 02/25/2014   RDW 14.5 02/25/2014   LYMPHSABS 1.1 02/25/2014   MONOABS 0.5 02/25/2014   EOSABS 0.2 02/25/2014   BASOSABS 0.0 02/25/2014     CMET  Lab Results  Component Value Date     NA 140 02/25/2014   K 3.6 02/25/2014   CL 106 10/08/2012   CO2 27 02/25/2014   GLUCOSE 106 02/25/2014   BUN 20.5 02/25/2014   CREATININE 1.4* 02/25/2014   CALCIUM 9.4 02/25/2014   PROT 7.3 02/25/2014   ALBUMIN 3.3* 02/25/2014   AST 15 02/25/2014   ALT 10 02/25/2014   ALKPHOS 119 02/25/2014   BILITOT 0.46 02/25/2014   ASSESSMENT/PLAN:    Lung cancer  Assessment & Plan Reviewed patient's lab results with him in detail today.  Patient appears to be tolerating his immunotherapy fairly well.  Will proceed today with cycle 4 of his Nivolumab as previously ordered.  Cycle 5 of the same regimen will be due in 2 weeks on 03/11/2014.  A few days prior to this cycle-patient will obtain a restaging CT of the chest/abdomen/pelvis.   Hypertension  Assessment & Plan Pressure today was elevated to 148/91.  Patient states that his blood pressure is always slightly elevated when he is here for an appointment.  Patient was advised to recheck his blood pressure gets back on; and to let us know if it remains elevated.   URI (upper respiratory infection)  Assessment & Plan Patient is complaining of some very mild URI symptoms which include occasional nasal congestion and dry cough.  He denies any recent fevers or chills.  Patient states he has been taking Mucinex over-the-counter.   Chronic renal insufficiency  Assessment & Plan Patient does have a history of chronic renal insufficiency.  Creatinine has improved from 1.6 down to 1.4.   Hypoalbuminemia  Assessment & Plan Patient albumin continues slightly decreased.  Patient was encouraged to push protein is much as possible.    Patient stated understanding of all instructions; and was in agreement with this plan of care. The patient knows to call the clinic with any problems, questions or concerns.   This was a shared visit with Dr. Julien Nordmann today.   Total time spent with patient was 25 minutes;  with greater than 75 percent of that time spent in  face to face counseling regarding his symptoms, and coordination of care and follow up.  Disclaimer: This note was dictated with voice recognition software. Similar sounding words can inadvertently be transcribed and may not be corrected upon review.   Drue Second, NP 02/25/2014   ADDENDUM: Hematology/Oncology Attending: I had a face to face encounter with the patient. I recommended his care plan. This is a very pleasant 79 years old white male with metastatic non-small cell lung cancer currently undergoing immunotherapy with Nivolumab status post 3 cycles. He is tolerating his treatment well with no significant adverse effects. The patient denied having any skin rash, diarrhea, liver or renal dysfunction. We will proceed with cycle #4 today as  scheduled. The patient would come back for followup visit in 2 weeks after repeating CT scan of the chest for restaging of his disease. He was advised to call immediately if he has any concerning symptoms in the interval.  Disclaimer: This note was dictated with voice recognition software. Similar sounding words can inadvertently be transcribed and may be missed upon review. Eilleen Kempf., MD 02/26/2014

## 2014-02-25 NOTE — Patient Instructions (Signed)
Nivolumab injection What is this medicine? NIVOLUMAB (nye VOL ue mab) is used to treat certain types of melanoma and lung cancer. This medicine may be used for other purposes; ask your health care provider or pharmacist if you have questions. COMMON BRAND NAME(S): Opdivo What should I tell my health care provider before I take this medicine? They need to know if you have any of these conditions: -eye disease, vision problems -history of pancreatitis -immune system problems -inflammatory bowel disease -kidney disease -liver disease -lung disease -lupus -myasthenia gravis -multiple sclerosis -organ transplant -stomach or intestine problems -thyroid disease -tingling of the fingers or toes, or other nerve disorder -an unusual or allergic reaction to nivolumab, other medicines, foods, dyes, or preservatives -pregnant or trying to get pregnant -breast-feeding How should I use this medicine? This medicine is for infusion into a vein. It is given by a health care professional in a hospital or clinic setting. A special MedGuide will be given to you before each treatment. Be sure to read this information carefully each time. Talk to your pediatrician regarding the use of this medicine in children. Special care may be needed. Overdosage: If you think you've taken too much of this medicine contact a poison control center or emergency room at once. Overdosage: If you think you have taken too much of this medicine contact a poison control center or emergency room at once. NOTE: This medicine is only for you. Do not share this medicine with others. What if I miss a dose? It is important not to miss your dose. Call your doctor or health care professional if you are unable to keep an appointment. What may interact with this medicine? Interactions have not been studied. This list may not describe all possible interactions. Give your health care provider a list of all the medicines, herbs,  non-prescription drugs, or dietary supplements you use. Also tell them if you smoke, drink alcohol, or use illegal drugs. Some items may interact with your medicine. What should I watch for while using this medicine? Tell your doctor or healthcare professional if your symptoms do not start to get better or if they get worse. Your condition will be monitored carefully while you are receiving this medicine. You may need blood work done while you are taking this medicine. What side effects may I notice from receiving this medicine? Side effects that you should report to your doctor or health care professional as soon as possible: -allergic reactions like skin rash, itching or hives, swelling of the face, lips, or tongue -black, tarry stools -bloody or watery diarrhea -changes in vision -chills -cough -depressed mood -eye pain -feeling anxious -fever -general ill feeling or flu-like symptoms -hair loss -loss of appetite -low blood counts - this medicine may decrease the number of white blood cells, red blood cells and platelets. You may be at increased risk for infections and bleeding -pain, tingling, numbness in the hands or feet -redness, blistering, peeling or loosening of the skin, including inside the mouth -red pinpoint spots on skin -signs of decreased platelets or bleeding - bruising, pinpoint red spots on the skin, black, tarry stools, blood in the urine -signs of decreased red blood cells - unusually weak or tired, feeling faint or lightheaded, falls -signs of infection - fever or chills, cough, sore throat, pain or trouble passing urine -signs and symptoms of a dangerous change in heartbeat or heart rhythm like chest pain; dizziness; fast or irregular heartbeat; palpitations; feeling faint or lightheaded, falls; breathing problems -signs  and symptoms of high blood sugar such as dizziness; dry mouth; dry skin; fruity breath; nausea; stomach pain; increased hunger or thirst; increased  urination -signs and symptoms of kidney injury like trouble passing urine or change in the amount of urine -signs and symptoms of liver injury like dark yellow or brown urine; general ill feeling or flu-like symptoms; light-colored stools; loss of appetite; nausea; right upper belly pain; unusually weak or tired; yellowing of the eyes or skin -signs and symptoms of increased potassium like muscle weakness; chest pain; or fast, irregular heartbeat -signs and symptoms of low potassium like muscle cramps or muscle pain; chest pain; dizziness; feeling faint or lightheaded, falls; palpitations; breathing problems; or fast, irregular heartbeat -swelling of the ankles, feet, hands -weight gainSide effects that usually do not require medical attention (report to your doctor or health care professional if they continue or are bothersome): -constipation -general ill feeling or flu-like symptoms -hair loss -loss of appetite -nausea, vomiting This list may not describe all possible side effects. Call your doctor for medical advice about side effects. You may report side effects to FDA at 1-800-FDA-1088. Where should I keep my medicine? This drug is given in a hospital or clinic and will not be stored at home. NOTE: This sheet is a summary. It may not cover all possible information. If you have questions about this medicine, talk to your doctor, pharmacist, or health care provider.  2015, [employer]. (2013-08-05 13:18:19)

## 2014-02-25 NOTE — Assessment & Plan Note (Addendum)
Reviewed patient's lab results with him in detail today.  Patient appears to be tolerating his immunotherapy fairly well.  Will proceed today with cycle 4 of his Nivolumab as previously ordered.  Cycle 5 of the same regimen will be due in 2 weeks on 03/11/2014.  A few days prior to this cycle-patient will obtain a restaging CT of the chest/abdomen/pelvis.

## 2014-02-25 NOTE — Assessment & Plan Note (Signed)
Patient is complaining of some very mild URI symptoms which include occasional nasal congestion and dry cough.  He denies any recent fevers or chills.  Patient states he has been taking Mucinex over-the-counter.

## 2014-02-25 NOTE — Assessment & Plan Note (Signed)
Patient does have a history of chronic renal insufficiency.  Creatinine has improved from 1.6 down to 1.4.

## 2014-02-25 NOTE — Assessment & Plan Note (Signed)
Patient albumin continues slightly decreased.  Patient was encouraged to push protein is much as possible.

## 2014-02-25 NOTE — Assessment & Plan Note (Signed)
Pressure today was elevated to 148/91.  Patient states that his blood pressure is always slightly elevated when he is here for an appointment.  Patient was advised to recheck his blood pressure gets back on; and to let us know if it remains elevated.

## 2014-02-26 ENCOUNTER — Telehealth: Payer: Self-pay | Admitting: Internal Medicine

## 2014-02-26 NOTE — Telephone Encounter (Signed)
s.w. pt and advised on OCT appts....pt ok and aware

## 2014-03-07 ENCOUNTER — Ambulatory Visit (HOSPITAL_COMMUNITY)
Admission: RE | Admit: 2014-03-07 | Discharge: 2014-03-07 | Disposition: A | Discharge status: Home / Self Care | Payer: Medicare Other | Source: Ambulatory Visit | Attending: Nurse Practitioner | Admitting: Nurse Practitioner

## 2014-03-07 ENCOUNTER — Other Ambulatory Visit: Payer: Self-pay | Admitting: Nurse Practitioner

## 2014-03-07 ENCOUNTER — Encounter (HOSPITAL_COMMUNITY): Payer: Self-pay

## 2014-03-07 DIAGNOSIS — C3490 Malignant neoplasm of unspecified part of unspecified bronchus or lung: Secondary | ICD-10-CM | POA: Insufficient documentation

## 2014-03-07 MED ORDER — IOHEXOL 300 MG/ML  SOLN
100.0000 mL | Freq: Once | INTRAMUSCULAR | Status: AC | PRN
  Administered 2014-03-07: 100 mL via INTRAVENOUS

## 2014-03-11 ENCOUNTER — Ambulatory Visit (HOSPITAL_BASED_OUTPATIENT_CLINIC_OR_DEPARTMENT_OTHER): Payer: Medicare Other

## 2014-03-11 ENCOUNTER — Telehealth: Payer: Self-pay | Admitting: Physician Assistant

## 2014-03-11 ENCOUNTER — Other Ambulatory Visit (HOSPITAL_BASED_OUTPATIENT_CLINIC_OR_DEPARTMENT_OTHER): Payer: Medicare Other

## 2014-03-11 ENCOUNTER — Encounter: Payer: Self-pay | Admitting: Internal Medicine

## 2014-03-11 ENCOUNTER — Encounter: Payer: Self-pay | Admitting: Physician Assistant

## 2014-03-11 ENCOUNTER — Ambulatory Visit (HOSPITAL_BASED_OUTPATIENT_CLINIC_OR_DEPARTMENT_OTHER): Payer: Medicare Other | Admitting: Physician Assistant

## 2014-03-11 VITALS — BP 178/91 | HR 79 | Temp 98.0°F | Resp 20 | Ht 65.5 in | Wt 200.0 lb

## 2014-03-11 DIAGNOSIS — C3490 Malignant neoplasm of unspecified part of unspecified bronchus or lung: Secondary | ICD-10-CM

## 2014-03-11 DIAGNOSIS — C3410 Malignant neoplasm of upper lobe, unspecified bronchus or lung: Secondary | ICD-10-CM

## 2014-03-11 DIAGNOSIS — Z5112 Encounter for antineoplastic immunotherapy: Secondary | ICD-10-CM

## 2014-03-11 DIAGNOSIS — C3430 Malignant neoplasm of lower lobe, unspecified bronchus or lung: Secondary | ICD-10-CM

## 2014-03-11 LAB — COMPREHENSIVE METABOLIC PANEL (CC13)
ALBUMIN: 3.5 g/dL (ref 3.5–5.0)
ALT: 12 U/L (ref 0–55)
ANION GAP: 11 meq/L (ref 3–11)
AST: 16 U/L (ref 5–34)
Alkaline Phosphatase: 125 U/L (ref 40–150)
BUN: 17.6 mg/dL (ref 7.0–26.0)
CO2: 25 mEq/L (ref 22–29)
CREATININE: 1.5 mg/dL — AB (ref 0.7–1.3)
Calcium: 9.3 mg/dL (ref 8.4–10.4)
Chloride: 105 mEq/L (ref 98–109)
Glucose: 86 mg/dl (ref 70–140)
POTASSIUM: 3.7 meq/L (ref 3.5–5.1)
Sodium: 140 mEq/L (ref 136–145)
TOTAL PROTEIN: 7.4 g/dL (ref 6.4–8.3)
Total Bilirubin: 0.38 mg/dL (ref 0.20–1.20)

## 2014-03-11 LAB — CBC WITH DIFFERENTIAL/PLATELET
BASO%: 1 % (ref 0.0–2.0)
Basophils Absolute: 0.1 10*3/uL (ref 0.0–0.1)
EOS%: 4 % (ref 0.0–7.0)
Eosinophils Absolute: 0.3 10*3/uL (ref 0.0–0.5)
HCT: 39.2 % (ref 38.4–49.9)
HEMOGLOBIN: 12.9 g/dL — AB (ref 13.0–17.1)
LYMPH#: 1.2 10*3/uL (ref 0.9–3.3)
LYMPH%: 15.6 % (ref 14.0–49.0)
MCH: 32.9 pg (ref 27.2–33.4)
MCHC: 33 g/dL (ref 32.0–36.0)
MCV: 99.5 fL — ABNORMAL HIGH (ref 79.3–98.0)
MONO#: 0.6 10*3/uL (ref 0.1–0.9)
MONO%: 7.3 % (ref 0.0–14.0)
NEUT#: 5.5 10*3/uL (ref 1.5–6.5)
NEUT%: 72.1 % (ref 39.0–75.0)
Platelets: 181 10*3/uL (ref 140–400)
RBC: 3.94 10*6/uL — ABNORMAL LOW (ref 4.20–5.82)
RDW: 14.1 % (ref 11.0–14.6)
WBC: 7.6 10*3/uL (ref 4.0–10.3)

## 2014-03-11 LAB — MAGNESIUM (CC13): Magnesium: 2 mg/dl (ref 1.5–2.5)

## 2014-03-11 MED ORDER — SODIUM CHLORIDE 0.9 % IV SOLN
Freq: Once | INTRAVENOUS | Status: AC
  Administered 2014-03-11: 13:00:00 via INTRAVENOUS

## 2014-03-11 MED ORDER — NIVOLUMAB CHEMO INJECTION 100 MG/10ML
3.0000 mg/kg | Freq: Once | INTRAVENOUS | Status: AC
  Administered 2014-03-11: 260 mg via INTRAVENOUS
  Filled 2014-03-11: qty 26

## 2014-03-11 NOTE — Telephone Encounter (Signed)
, °

## 2014-03-11 NOTE — Progress Notes (Addendum)
[facility] [phone]   [phone]  SHARED VISIT PROGRESS NOTE  [name], MD [place] [address]  DIAGNOSIS: Stage IIIB (T2a., N3, M0) non-small cell lung cancer consistent with invasive adenocarcinoma with negative ALK gene translocation and negative EGFR mutation diagnosed in October of 2013   PRIOR THERAPY:  1) Concurrent chemoradiation with weekly carboplatin for AUC of 2 and paclitaxel 45 mg/M2, last dose was given 06/11/2012 with partial response.  2) Consolidation chemotherapy with carboplatin for AUC of 5 and Alimta 500 mg/M2 every 3 weeks. Status post 3 cycles last dose was given 09/12/2012 with stable disease.  3) Systemic chemotherapy again with carboplatin for AUC of 5 and Alimta 500 mg/M2 every 3 weeks. First cycle expected on 11/13/2013. Status post 3 cycles, discontinued secondary to disease progression. Carboplatin was discontinued starting from cycle #2 secondary to hypersensitivity reaction.  CURRENT THERAPY: Immunotherapy with single agent Nivolumab 3 MG/TG every 2 weeks status post 4 cycles.   CHEMOTHERAPY INTENT: Palliative  CURRENT # OF CHEMOTHERAPY CYCLES: 5  CURRENT ANTIEMETICS: N/A  CURRENT SMOKING STATUS: Former Smoker  ORAL CHEMOTHERAPY AND CONSENT: None  CURRENT BISPHOSPHONATES USE: None  PAIN MANAGEMENT: 0/10  NARCOTICS INDUCED CONSTIPATION: None  LIVING WILL AND CODE STATUS: no code Blue  INTERVAL HISTORY: ROCKFORD LEINEN 79 y.o. male returns to the clinic today for three-month follow up visit accompanied by his wife. He is tolerating his treatment with Nivolumab fairly well with no significant adverse effects. He denied having any nausea or vomiting, no fever or chills, no diarrhea or skin rash. The patient denied having any significant weight loss or night sweats. He has no chest pain but continues to have shortness breath with exertion with no cough or hemoptysis. He is  here today for cycle #5. He also recently had restaging CT scan of the chest, abdomen and pelvis with contrast to reevaluate his disease and presents to discuss those results.  MEDICAL HISTORY: Past Medical History  Diagnosis Date  . Hypertension   . History of radiation therapy 05/02/12-06/19/12    left lung mediastinal lymph nodes 66Gy/8fx  . Lung cancer 04/12/2012    ALLERGIES:  has No Known Allergies.  MEDICATIONS:  Current Outpatient Prescriptions  Medication Sig Dispense Refill  . albuterol (PROVENTIL HFA;VENTOLIN HFA) 108 (90 BASE) MCG/ACT inhaler Inhale 2 puffs into the lungs every 6 (six) hours as needed for wheezing or shortness of breath.  1 Inhaler  2  . folic acid (FOLVITE) 1 MG tablet Take 1 tablet (1 mg total) by mouth daily.  30 tablet  4  . guaiFENesin (MUCINEX) 600 MG 12 hr tablet Take 1,200 mg by mouth 2 (two) times daily.      Marland Kitchen ibuprofen (ADVIL,MOTRIN) 200 MG tablet Take 400 mg by mouth every 6 (six) hours as needed.      . triamterene-hydrochlorothiazide (MAXZIDE) 75-50 MG per tablet Take 1 tablet by mouth daily.       No current facility-administered medications for this visit.    REVIEW OF SYSTEMS:  Constitutional: positive for fatigue Eyes: negative Ears, nose, mouth, throat, and face: negative Respiratory: positive for dyspnea on exertion Cardiovascular: negative Gastrointestinal: negative Genitourinary:negative Integument/breast: negative Hematologic/lymphatic: negative Musculoskeletal:negative Neurological: negative Behavioral/Psych: negative Endocrine: negative Allergic/Immunologic: negative   PHYSICAL EXAMINATION: General appearance: alert, cooperative and no distress Head: Normocephalic, without obvious abnormality, atraumatic Neck: no adenopathy Lymph nodes: Cervical, supraclavicular, and axillary nodes normal. Resp: clear to auscultation bilaterally Cardio:  regular rate and rhythm, S1, S2 normal, no murmur, click, rub or gallop GI: soft,  non-tender; bowel sounds normal; no masses,  no organomegaly Extremities: extremities normal, atraumatic, no cyanosis or edema  ECOG PERFORMANCE STATUS: 1 - Symptomatic but completely ambulatory  Blood pressure 178/91, pulse 79, temperature 98 F (36.7 C), temperature source Oral, resp. rate 20, height 5' 5.5" (1.664 m), weight 200 lb (90.719 kg), SpO2 98.00%.  LABORATORY DATA: Lab Results  Component Value Date   WBC 7.6 03/11/2014   HGB 12.9* 03/11/2014   HCT 39.2 03/11/2014   MCV 99.5* 03/11/2014   PLT 181 03/11/2014      Chemistry      Component Value Date/Time   NA 140 03/11/2014 1000   K 3.7 03/11/2014 1000   CL 106 10/08/2012 1028   CO2 25 03/11/2014 1000   BUN 17.6 03/11/2014 1000   CREATININE 1.5* 03/11/2014 1000      Component Value Date/Time   CALCIUM 9.3 03/11/2014 1000   ALKPHOS 125 03/11/2014 1000   AST 16 03/11/2014 1000   ALT 12 03/11/2014 1000   BILITOT 0.38 03/11/2014 1000       RADIOGRAPHIC STUDIES: Ct Chest W Contrast  03/07/2014   CLINICAL DATA:  Current history of lung cancer.  EXAM: CT CHEST, ABDOMEN, AND PELVIS WITH CONTRAST  TECHNIQUE: Multidetector CT imaging of the chest, abdomen and pelvis was performed following the standard protocol during bolus administration of intravenous contrast.  CONTRAST:  186mL OMNIPAQUE IOHEXOL 300 MG/ML  SOLN  COMPARISON:  CT scan of January 09, 2014.  FINDINGS: CT CHEST FINDINGS  No pneumothorax or pleural effusion is noted. Stable ill-defined density change seen in the right lower lobe best seen on image number 28 consistent with radiation change. Ill-defined right hilar density measures 14 x 10 mm and is slightly improved compared to prior exam. Stable densities containing air bronchograms are noted medially in the right upper lobe along the mediastinal border, as well as in the right retro hilar region of the lower lobe along the spine. These are most consistent with radiation fibrosis. Ill-defined density seen  laterally in right lower lobe best seen on image number 38 measures 9 x 7 mm which is not significantly changed compared to prior exam. Large density is noted posteriorly in the left lower lobe which is unchanged compared to prior exam and consistent with scarring or radiation fibrosis. Irregular density measuring 13 x 6 mm is noted in the left lower lobe adjacent to the left ventricle best seen on image number 45, which is unchanged compared to prior exam. Findings consistent with radiation fibrosis are again noted in the left hilar region and upper lobe which are unchanged compared to prior exam. No new lesions are noted within the pulmonary parenchyma.  Coronary artery calcifications are noted. Thoracic aorta appears normal. Precarinal lymph node measuring 24 x 15 mm is noted which is not changed compared to prior exam. 15 x 10 mm left note is seen to the right of the ascending aorta and anterior mediastinum which is unchanged compared to prior exam. Sub carinal lymph node measuring 18 x 13 mm is noted which is unchanged compared to prior exam. No new mediastinal lymph nodes are noted.  CT ABDOMEN AND PELVIS FINDINGS  No gallstones are noted. Stable small cyst is noted in the left hepatic lobe. Stable small cysts are also noted posteriorly in the right hepatic lobe. The spleen and pancreas appear normal. Stable bilateral adrenal adenomas. Stable left renal  cyst. No hydronephrosis or renal obstruction is noted. No renal or ureteral calculi are noted. Atherosclerotic calcifications of abdominal aorta are noted without aneurysm formation. Sigmoid diverticulosis is noted without inflammation. There is no evidence of bowel obstruction.  Right retroperitoneal left note is noted at the level of the kidneys measuring 16 x 12 mm which is not significantly changed compared to prior exam. Right periaortic lymph node measuring 19 x 15 mm is noted which is slightly increased compared to prior exam, when it measured 15 x 13  mm. Another retroperitoneal lymph node is noted near the aortic bifurcation that measures 14 x 14 mm which is unchanged compared to prior exam. Left external iliac adenopathy is noted with the largest lymph node measuring 26 x 26 mm which may be slightly enlarged compared to prior exam, when it measured 22 x 20 mm. Left common iliac lymph node is noted measuring 22 x 15 mm, which is not significantly changed compared to prior exam. Urinary bladder appears normal. Mild prostatic enlargement is noted which is stable. Stool is noted in the rectum. No abnormal fluid collection is noted.  Multilevel degenerative disc disease is noted in the lumbar spine. Sclerotic density is noted throughout the L5 vertebral body which is unchanged compared to prior exam. Metastatic involvement cannot be excluded.  IMPRESSION: Overall, there are stable densities throughout both lungs compared to prior exam. No new lesions are noted in the pulmonary parenchyma.  Stable mediastinal adenopathy is also noted.  Within the abdomen and pelvis, there is continued presence of adenopathy involving the retroperitoneal in left iliac regions, the majority of which are not significantly changed. There is noted 1 right periaortic lymph node and 1 left external iliac lymph node which are slightly increased in size compared to prior exam. No new lymph nodes are noted.  Stable sclerotic density is seen in the L5 vertebral body which may simply represent degenerative change, metastatic disease cannot be excluded. Node lytic or destructive lesions are noted.   Electronically Signed   By: Roque Lias M.D.   On: 03/07/2014 11:17   Ct Abdomen Pelvis W Contrast  03/07/2014   CLINICAL DATA:  Current history of lung cancer.  EXAM: CT CHEST, ABDOMEN, AND PELVIS WITH CONTRAST  TECHNIQUE: Multidetector CT imaging of the chest, abdomen and pelvis was performed following the standard protocol during bolus administration of intravenous contrast.  CONTRAST:   OMNIPAQUE IOHEXOL 300 MG/ML  SOLN  COMPARISON:  CT scan of January 09, 2014.  FINDINGS: CT CHEST FINDINGS  No pneumothorax or pleural effusion is noted. Stable ill-defined density change seen in the right lower lobe best seen on image number 28 consistent with radiation change. Ill-defined right hilar density measures 14 x 10 mm and is slightly improved compared to prior exam. Stable densities containing air bronchograms are noted medially in the right upper lobe along the mediastinal border, as well as in the right retro hilar region of the lower lobe along the spine. These are most consistent with radiation fibrosis. Ill-defined density seen laterally in right lower lobe best seen on image number 38 measures 9 x 7 mm which is not significantly changed compared to prior exam. Large density is noted posteriorly in the left lower lobe which is unchanged compared to prior exam and consistent with scarring or radiation fibrosis. Irregular density measuring 13 x 6 mm is noted in the left lower lobe adjacent to the left ventricle best seen on image number 45, which is unchanged compared to  prior exam. Findings consistent with radiation fibrosis are again noted in the left hilar region and upper lobe which are unchanged compared to prior exam. No new lesions are noted within the pulmonary parenchyma.  Coronary artery calcifications are noted. Thoracic aorta appears normal. Precarinal lymph node measuring 24 x 15 mm is noted which is not changed compared to prior exam. 15 x 10 mm left note is seen to the right of the ascending aorta and anterior mediastinum which is unchanged compared to prior exam. Sub carinal lymph node measuring 18 x 13 mm is noted which is unchanged compared to prior exam. No new mediastinal lymph nodes are noted.  CT ABDOMEN AND PELVIS FINDINGS  No gallstones are noted. Stable small cyst is noted in the left hepatic lobe. Stable small cysts are also noted posteriorly in the right hepatic lobe. The  spleen and pancreas appear normal. Stable bilateral adrenal adenomas. Stable left renal cyst. No hydronephrosis or renal obstruction is noted. No renal or ureteral calculi are noted. Atherosclerotic calcifications of abdominal aorta are noted without aneurysm formation. Sigmoid diverticulosis is noted without inflammation. There is no evidence of bowel obstruction.  Right retroperitoneal left note is noted at the level of the kidneys measuring 16 x 12 mm which is not significantly changed compared to prior exam. Right periaortic lymph node measuring 19 x 15 mm is noted which is slightly increased compared to prior exam, when it measured 15 x 13 mm. Another retroperitoneal lymph node is noted near the aortic bifurcation that measures 14 x 14 mm which is unchanged compared to prior exam. Left external iliac adenopathy is noted with the largest lymph node measuring 26 x 26 mm which may be slightly enlarged compared to prior exam, when it measured 22 x 20 mm. Left common iliac lymph node is noted measuring 22 x 15 mm, which is not significantly changed compared to prior exam. Urinary bladder appears normal. Mild prostatic enlargement is noted which is stable. Stool is noted in the rectum. No abnormal fluid collection is noted.  Multilevel degenerative disc disease is noted in the lumbar spine. Sclerotic density is noted throughout the L5 vertebral body which is unchanged compared to prior exam. Metastatic involvement cannot be excluded.  IMPRESSION: Overall, there are stable densities throughout both lungs compared to prior exam. No new lesions are noted in the pulmonary parenchyma.  Stable mediastinal adenopathy is also noted.  Within the abdomen and pelvis, there is continued presence of adenopathy involving the retroperitoneal in left iliac regions, the majority of which are not significantly changed. There is noted 1 right periaortic lymph node and 1 left external iliac lymph node which are slightly increased in  size compared to prior exam. No new lymph nodes are noted.  Stable sclerotic density is seen in the L5 vertebral body which may simply represent degenerative change, metastatic disease cannot be excluded. Node lytic or destructive lesions are noted.   Electronically Signed   By: Sabino Dick M.D.   On: 03/07/2014 11:17   ASSESSMENT AND PLAN: this is a very pleasant 79 years old white male with history of stage IIIB non-small cell lung cancer status post concurrent chemoradiation followed by consolidation chemotherapy. He had evidence for disease recurrence and the patient was started on systemic chemotherapy with carboplatin and Alimta discontinued secondary to disease progression. He is currently undergoing immunotherapy with single agent Nivolumab status post 4 cycles. Patient was discussed with and also seen by Dr. Julien Nordmann. His restaging CT scan of the  chest, abdomen and pelvis revealed stable disease. He is tolerating his treatment without difficulty. He'll proceed with cycle #5 of immunotherapy with Nivolumab. He'll followup in 2 weeks prior to the start of cycle #6.  He was advised to call immediately if he has any concerning symptoms in the interval. The patient voices understanding of current disease status and treatment options and is in agreement with the current care plan.  All questions were answered. The patient knows to call the clinic with any problems, questions or concerns. We can certainly see the patient much sooner if necessary.  Carlton Adam PA-C  ADDENDUM: Hematology/Oncology Attending: I had a face to face encounter with the patient. I recommended his care plan. This is a very pleasant 79 years old white male with progressive non-small cell lung cancer initially diagnosed as stage IIIB. He is status post several chemotherapy regimens and recently started on treatment with immunotherapy with Nivolumab status post 4 cycles. He is tolerating his treatment with Nivolumab fairly  well with no significant adverse effects. The recent CT scan of the chest, abdomen and pelvis showed stable disease with no significant evidence for disease progression. I discussed the scan results with the patient and his wife. I recommended for him to continue his current treatment with improvement with the same dose. He was started cycle #5 today. He would come back for followup visit in 2 weeks with the next cycle of his treatment. The patient was advised to call immediately if he has any concerning symptoms in the interval.  Disclaimer: This note was dictated with voice recognition software. Similar sounding words can inadvertently be transcribed and may not be corrected upon review. Eilleen Kempf., MD 03/13/2014

## 2014-03-11 NOTE — Patient Instructions (Signed)
Nivolumab injection What is this medicine? NIVOLUMAB (nye VOL ue mab) is used to treat certain types of melanoma and lung cancer. This medicine may be used for other purposes; ask your health care provider or pharmacist if you have questions. COMMON BRAND NAME(S): Opdivo What should I tell my health care provider before I take this medicine? They need to know if you have any of these conditions: -eye disease, vision problems -history of pancreatitis -immune system problems -inflammatory bowel disease -kidney disease -liver disease -lung disease -lupus -myasthenia gravis -multiple sclerosis -organ transplant -stomach or intestine problems -thyroid disease -tingling of the fingers or toes, or other nerve disorder -an unusual or allergic reaction to nivolumab, other medicines, foods, dyes, or preservatives -pregnant or trying to get pregnant -breast-feeding How should I use this medicine? This medicine is for infusion into a vein. It is given by a health care professional in a hospital or clinic setting. A special MedGuide will be given to you before each treatment. Be sure to read this information carefully each time. Talk to your pediatrician regarding the use of this medicine in children. Special care may be needed. Overdosage: If you think you've taken too much of this medicine contact a poison control center or emergency room at once. Overdosage: If you think you have taken too much of this medicine contact a poison control center or emergency room at once. NOTE: This medicine is only for you. Do not share this medicine with others. What if I miss a dose? It is important not to miss your dose. Call your doctor or health care professional if you are unable to keep an appointment. What may interact with this medicine? Interactions have not been studied. This list may not describe all possible interactions. Give your health care provider a list of all the medicines, herbs,  non-prescription drugs, or dietary supplements you use. Also tell them if you smoke, drink alcohol, or use illegal drugs. Some items may interact with your medicine. What should I watch for while using this medicine? Tell your doctor or healthcare professional if your symptoms do not start to get better or if they get worse. Your condition will be monitored carefully while you are receiving this medicine. You may need blood work done while you are taking this medicine. What side effects may I notice from receiving this medicine? Side effects that you should report to your doctor or health care professional as soon as possible: -allergic reactions like skin rash, itching or hives, swelling of the face, lips, or tongue -black, tarry stools -bloody or watery diarrhea -changes in vision -chills -cough -depressed mood -eye pain -feeling anxious -fever -general ill feeling or flu-like symptoms -hair loss -loss of appetite -low blood counts - this medicine may decrease the number of white blood cells, red blood cells and platelets. You may be at increased risk for infections and bleeding -pain, tingling, numbness in the hands or feet -redness, blistering, peeling or loosening of the skin, including inside the mouth -red pinpoint spots on skin -signs of decreased platelets or bleeding - bruising, pinpoint red spots on the skin, black, tarry stools, blood in the urine -signs of decreased red blood cells - unusually weak or tired, feeling faint or lightheaded, falls -signs of infection - fever or chills, cough, sore throat, pain or trouble passing urine -signs and symptoms of a dangerous change in heartbeat or heart rhythm like chest pain; dizziness; fast or irregular heartbeat; palpitations; feeling faint or lightheaded, falls; breathing problems -signs  and symptoms of high blood sugar such as dizziness; dry mouth; dry skin; fruity breath; nausea; stomach pain; increased hunger or thirst; increased  urination -signs and symptoms of kidney injury like trouble passing urine or change in the amount of urine -signs and symptoms of liver injury like dark yellow or brown urine; general ill feeling or flu-like symptoms; light-colored stools; loss of appetite; nausea; right upper belly pain; unusually weak or tired; yellowing of the eyes or skin -signs and symptoms of increased potassium like muscle weakness; chest pain; or fast, irregular heartbeat -signs and symptoms of low potassium like muscle cramps or muscle pain; chest pain; dizziness; feeling faint or lightheaded, falls; palpitations; breathing problems; or fast, irregular heartbeat -swelling of the ankles, feet, hands -weight gainSide effects that usually do not require medical attention (report to your doctor or health care professional if they continue or are bothersome): -constipation -general ill feeling or flu-like symptoms -hair loss -loss of appetite -nausea, vomiting This list may not describe all possible side effects. Call your doctor for medical advice about side effects. You may report side effects to FDA at 1-800-FDA-1088. Where should I keep my medicine? This drug is given in a hospital or clinic and will not be stored at home. NOTE: This sheet is a summary. It may not cover all possible information. If you have questions about this medicine, talk to your doctor, pharmacist, or health care provider.  2015, [employer]. (2013-08-05 13:18:19)

## 2014-03-13 NOTE — Patient Instructions (Signed)
Your restaging CT scan of the chest, abdomen and pelvis revealed stable disease Followup in 2 weeks prior to your next scheduled cycle of immunotherapy

## 2014-03-25 ENCOUNTER — Other Ambulatory Visit (HOSPITAL_BASED_OUTPATIENT_CLINIC_OR_DEPARTMENT_OTHER): Payer: Medicare Other

## 2014-03-25 ENCOUNTER — Ambulatory Visit (HOSPITAL_BASED_OUTPATIENT_CLINIC_OR_DEPARTMENT_OTHER): Payer: Medicare Other

## 2014-03-25 ENCOUNTER — Other Ambulatory Visit: Payer: Self-pay | Admitting: Hematology and Oncology

## 2014-03-25 VITALS — BP 172/87 | HR 94 | Temp 98.2°F | Resp 20

## 2014-03-25 DIAGNOSIS — Z5112 Encounter for antineoplastic immunotherapy: Secondary | ICD-10-CM | POA: Diagnosis not present

## 2014-03-25 DIAGNOSIS — C3432 Malignant neoplasm of lower lobe, left bronchus or lung: Secondary | ICD-10-CM

## 2014-03-25 DIAGNOSIS — C3490 Malignant neoplasm of unspecified part of unspecified bronchus or lung: Secondary | ICD-10-CM

## 2014-03-25 DIAGNOSIS — C3410 Malignant neoplasm of upper lobe, unspecified bronchus or lung: Secondary | ICD-10-CM

## 2014-03-25 LAB — CBC WITH DIFFERENTIAL/PLATELET
BASO%: 0.7 % (ref 0.0–2.0)
Basophils Absolute: 0.1 10*3/uL (ref 0.0–0.1)
EOS%: 3.1 % (ref 0.0–7.0)
Eosinophils Absolute: 0.3 10*3/uL (ref 0.0–0.5)
HCT: 44.4 % (ref 38.4–49.9)
HGB: 14.4 g/dL (ref 13.0–17.1)
LYMPH%: 13.2 % — ABNORMAL LOW (ref 14.0–49.0)
MCH: 31.7 pg (ref 27.2–33.4)
MCHC: 32.4 g/dL (ref 32.0–36.0)
MCV: 97.7 fL (ref 79.3–98.0)
MONO#: 0.5 10*3/uL (ref 0.1–0.9)
MONO%: 5.8 % (ref 0.0–14.0)
NEUT#: 6.6 10*3/uL — ABNORMAL HIGH (ref 1.5–6.5)
NEUT%: 77.2 % — ABNORMAL HIGH (ref 39.0–75.0)
PLATELETS: 182 10*3/uL (ref 140–400)
RBC: 4.54 10*6/uL (ref 4.20–5.82)
RDW: 14.1 % (ref 11.0–14.6)
WBC: 8.5 10*3/uL (ref 4.0–10.3)
lymph#: 1.1 10*3/uL (ref 0.9–3.3)

## 2014-03-25 LAB — COMPREHENSIVE METABOLIC PANEL (CC13)
ALK PHOS: 112 U/L (ref 40–150)
ALT: 15 U/L (ref 0–55)
AST: 16 U/L (ref 5–34)
Albumin: 3.7 g/dL (ref 3.5–5.0)
Anion Gap: 12 mEq/L — ABNORMAL HIGH (ref 3–11)
BUN: 24.9 mg/dL (ref 7.0–26.0)
CO2: 24 mEq/L (ref 22–29)
Calcium: 9.6 mg/dL (ref 8.4–10.4)
Chloride: 104 mEq/L (ref 98–109)
Creatinine: 1.5 mg/dL — ABNORMAL HIGH (ref 0.7–1.3)
GLUCOSE: 81 mg/dL (ref 70–140)
POTASSIUM: 3.6 meq/L (ref 3.5–5.1)
SODIUM: 139 meq/L (ref 136–145)
TOTAL PROTEIN: 7.7 g/dL (ref 6.4–8.3)
Total Bilirubin: 0.36 mg/dL (ref 0.20–1.20)

## 2014-03-25 MED ORDER — SODIUM CHLORIDE 0.9 % IV SOLN
3.0000 mg/kg | Freq: Once | INTRAVENOUS | Status: AC
  Administered 2014-03-25: 260 mg via INTRAVENOUS
  Filled 2014-03-25: qty 26

## 2014-03-25 MED ORDER — SODIUM CHLORIDE 0.9 % IV SOLN
Freq: Once | INTRAVENOUS | Status: AC
  Administered 2014-03-25: 11:00:00 via INTRAVENOUS

## 2014-03-25 NOTE — Patient Instructions (Signed)
[facility] Discharge Instructions for Patients Receiving Chemotherapy  Today you received the following chemotherapy agents: Nivolumab.  To help prevent nausea and vomiting after your treatment, we encourage you to take your nausea medication as prescribed.   If you develop nausea and vomiting that is not controlled by your nausea medication, call the clinic.   BELOW ARE SYMPTOMS THAT SHOULD BE REPORTED IMMEDIATELY:  *FEVER GREATER THAN 100.5 F  *CHILLS WITH OR WITHOUT FEVER  NAUSEA AND VOMITING THAT IS NOT CONTROLLED WITH YOUR NAUSEA MEDICATION  *UNUSUAL SHORTNESS OF BREATH  *UNUSUAL BRUISING OR BLEEDING  TENDERNESS IN MOUTH AND THROAT WITH OR WITHOUT PRESENCE OF ULCERS  *URINARY PROBLEMS  *BOWEL PROBLEMS  UNUSUAL RASH Items with * indicate a potential emergency and should be followed up as soon as possible.  Feel free to call the clinic you have any questions or concerns. The clinic phone number is [phone].

## 2014-03-28 ENCOUNTER — Telehealth: Payer: Self-pay | Admitting: *Deleted

## 2014-03-28 NOTE — Telephone Encounter (Signed)
PEr desk RN I have scheduled apapts and called the patient's wife

## 2014-04-08 ENCOUNTER — Ambulatory Visit (HOSPITAL_BASED_OUTPATIENT_CLINIC_OR_DEPARTMENT_OTHER): Payer: Medicare Other

## 2014-04-08 ENCOUNTER — Encounter: Payer: Self-pay | Admitting: Internal Medicine

## 2014-04-08 ENCOUNTER — Ambulatory Visit (HOSPITAL_BASED_OUTPATIENT_CLINIC_OR_DEPARTMENT_OTHER): Payer: Medicare Other | Admitting: Internal Medicine

## 2014-04-08 ENCOUNTER — Other Ambulatory Visit (HOSPITAL_BASED_OUTPATIENT_CLINIC_OR_DEPARTMENT_OTHER): Payer: Medicare Other

## 2014-04-08 ENCOUNTER — Telehealth: Payer: Self-pay | Admitting: Internal Medicine

## 2014-04-08 VITALS — BP 146/78 | HR 105 | Temp 98.3°F | Resp 18 | Ht 65.5 in | Wt 198.1 lb

## 2014-04-08 DIAGNOSIS — C3410 Malignant neoplasm of upper lobe, unspecified bronchus or lung: Secondary | ICD-10-CM

## 2014-04-08 DIAGNOSIS — C3432 Malignant neoplasm of lower lobe, left bronchus or lung: Secondary | ICD-10-CM

## 2014-04-08 DIAGNOSIS — R209 Unspecified disturbances of skin sensation: Secondary | ICD-10-CM

## 2014-04-08 DIAGNOSIS — Z5112 Encounter for antineoplastic immunotherapy: Secondary | ICD-10-CM

## 2014-04-08 DIAGNOSIS — C3411 Malignant neoplasm of upper lobe, right bronchus or lung: Secondary | ICD-10-CM

## 2014-04-08 DIAGNOSIS — C3490 Malignant neoplasm of unspecified part of unspecified bronchus or lung: Secondary | ICD-10-CM

## 2014-04-08 DIAGNOSIS — M545 Low back pain: Secondary | ICD-10-CM

## 2014-04-08 LAB — CBC WITH DIFFERENTIAL/PLATELET
BASO%: 0.2 % (ref 0.0–2.0)
Basophils Absolute: 0 10*3/uL (ref 0.0–0.1)
EOS ABS: 0.2 10*3/uL (ref 0.0–0.5)
EOS%: 2.2 % (ref 0.0–7.0)
HEMATOCRIT: 39.8 % (ref 38.4–49.9)
HGB: 13.3 g/dL (ref 13.0–17.1)
LYMPH%: 13.9 % — ABNORMAL LOW (ref 14.0–49.0)
MCH: 31.4 pg (ref 27.2–33.4)
MCHC: 33.4 g/dL (ref 32.0–36.0)
MCV: 93.9 fL (ref 79.3–98.0)
MONO#: 0.8 10*3/uL (ref 0.1–0.9)
MONO%: 9.4 % (ref 0.0–14.0)
NEUT%: 74.3 % (ref 39.0–75.0)
NEUTROS ABS: 6.3 10*3/uL (ref 1.5–6.5)
PLATELETS: 163 10*3/uL (ref 140–400)
RBC: 4.24 10*6/uL (ref 4.20–5.82)
RDW: 13.1 % (ref 11.0–14.6)
WBC: 8.5 10*3/uL (ref 4.0–10.3)
lymph#: 1.2 10*3/uL (ref 0.9–3.3)

## 2014-04-08 LAB — COMPREHENSIVE METABOLIC PANEL (CC13)
ALT: 11 U/L (ref 0–55)
ANION GAP: 10 meq/L (ref 3–11)
AST: 16 U/L (ref 5–34)
Albumin: 3.6 g/dL (ref 3.5–5.0)
Alkaline Phosphatase: 116 U/L (ref 40–150)
BUN: 27.7 mg/dL — ABNORMAL HIGH (ref 7.0–26.0)
CO2: 25 meq/L (ref 22–29)
CREATININE: 1.7 mg/dL — AB (ref 0.7–1.3)
Calcium: 9.4 mg/dL (ref 8.4–10.4)
Chloride: 102 mEq/L (ref 98–109)
Glucose: 97 mg/dl (ref 70–140)
Potassium: 3.5 mEq/L (ref 3.5–5.1)
Sodium: 137 mEq/L (ref 136–145)
Total Bilirubin: 0.5 mg/dL (ref 0.20–1.20)
Total Protein: 7.4 g/dL (ref 6.4–8.3)

## 2014-04-08 MED ORDER — SODIUM CHLORIDE 0.9 % IV SOLN
3.0000 mg/kg | Freq: Once | INTRAVENOUS | Status: AC
  Administered 2014-04-08: 260 mg via INTRAVENOUS
  Filled 2014-04-08: qty 26

## 2014-04-08 MED ORDER — SODIUM CHLORIDE 0.9 % IV SOLN
Freq: Once | INTRAVENOUS | Status: AC
  Administered 2014-04-08: 10:00:00 via INTRAVENOUS

## 2014-04-08 NOTE — Telephone Encounter (Signed)
gv adn printed appt sched and avs fo rpt for NOV and Dec....sed added tx.

## 2014-04-08 NOTE — Progress Notes (Signed)
.      [facility] [phone]) [phone]   [phone]) [phone]  OFFICE PROGRESS NOTE  [name], MD [place] [address]  DIAGNOSIS: Stage IIIB (T2a., N3, M0) non-small cell lung cancer consistent with invasive adenocarcinoma with negative ALK gene translocation and negative EGFR mutation diagnosed in October of 2013   PRIOR THERAPY:  1) Concurrent chemoradiation with weekly carboplatin for AUC of 2 and paclitaxel 45 mg/M2, last dose was given 06/11/2012 with partial response.  2) Consolidation chemotherapy with carboplatin for AUC of 5 and Alimta 500 mg/M2 every 3 weeks. Status post 3 cycles last dose was given 09/12/2012 with stable disease.  3) Systemic chemotherapy again with carboplatin for AUC of 5 and Alimta 500 mg/M2 every 3 weeks. First cycle expected on 11/13/2013. Status post 3 cycles, discontinued secondary to disease progression. Carboplatin was discontinued starting from cycle #2 secondary to hypersensitivity reaction.  CURRENT THERAPY: Immunotherapy with single agent Nivolumab 3 MG/TG every 2 weeks status post 6 cycles.   CHEMOTHERAPY INTENT: Palliative  CURRENT # OF CHEMOTHERAPY CYCLES: 7  CURRENT ANTIEMETICS: N/A  CURRENT SMOKING STATUS: Former Smoker  ORAL CHEMOTHERAPY AND CONSENT: None  CURRENT BISPHOSPHONATES USE: None  PAIN MANAGEMENT: 0/10  NARCOTICS INDUCED CONSTIPATION: None  LIVING WILL AND CODE STATUS: no code Blue  INTERVAL HISTORY: Logan Russell 79 y.o. male returns to the clinic today for three-month follow up visit accompanied by his wife. He is tolerating his treatment with Nivolumab fairly well with no significant adverse effects. He complains of low back pain and some numbness in the groin area. He relates his numbness to sitting for prolonged period. He denied having any nausea or vomiting, no fever or chills, no diarrhea or skin rash. The patient denied having any significant weight loss or  night sweats. He has no chest pain but continues to have shortness breath with exertion with no cough or hemoptysis. He is here today for cycle #7.  MEDICAL HISTORY: Past Medical History  Diagnosis Date  . Hypertension   . History of radiation therapy 05/02/12-06/19/12    left lung mediastinal lymph nodes 66Gy/52fx  . Lung cancer 04/12/2012    ALLERGIES:  has No Known Allergies.  MEDICATIONS:  Current Outpatient Prescriptions  Medication Sig Dispense Refill  . albuterol (PROVENTIL HFA;VENTOLIN HFA) 108 (90 BASE) MCG/ACT inhaler Inhale 2 puffs into the lungs every 6 (six) hours as needed for wheezing or shortness of breath. 1 Inhaler 2  . folic acid (FOLVITE) 1 MG tablet Take 1 tablet (1 mg total) by mouth daily. 30 tablet 4  . guaiFENesin (MUCINEX) 600 MG 12 hr tablet Take 1,200 mg by mouth 2 (two) times daily.    Marland Kitchen ibuprofen (ADVIL,MOTRIN) 200 MG tablet Take 400 mg by mouth every 6 (six) hours as needed.    . triamterene-hydrochlorothiazide (MAXZIDE) 75-50 MG per tablet Take 1 tablet by mouth daily.     No current facility-administered medications for this visit.    REVIEW OF SYSTEMS:  Constitutional: positive for fatigue Eyes: negative Ears, nose, mouth, throat, and face: negative Respiratory: positive for dyspnea on exertion Cardiovascular: negative Gastrointestinal: negative Genitourinary:negative Integument/breast: negative Hematologic/lymphatic: negative Musculoskeletal:negative Neurological: negative Behavioral/Psych: negative Endocrine: negative Allergic/Immunologic: negative   PHYSICAL EXAMINATION: General appearance: alert, cooperative and no distress Head: Normocephalic, without obvious abnormality, atraumatic Neck: no adenopathy Lymph nodes: Cervical, supraclavicular, and axillary nodes normal. Resp: clear to auscultation bilaterally Cardio: regular rate and rhythm, S1, S2 normal, no murmur, click, rub or  gallop GI: soft, non-tender; bowel sounds normal; no  masses,  no organomegaly Extremities: extremities normal, atraumatic, no cyanosis or edema  ECOG PERFORMANCE STATUS: 1 - Symptomatic but completely ambulatory  Blood pressure 146/78, pulse 105, temperature 98.3 F (36.8 C), temperature source Oral, resp. rate 18, height 5' 5.5" (1.664 m), weight 198 lb 1.6 oz (89.858 kg), SpO2 98 %.  LABORATORY DATA: Lab Results  Component Value Date   WBC 8.5 04/08/2014   HGB 13.3 04/08/2014   HCT 39.8 04/08/2014   MCV 93.9 04/08/2014   PLT 163 04/08/2014      Chemistry      Component Value Date/Time   NA 139 03/25/2014 1032   K 3.6 03/25/2014 1032   CL 106 10/08/2012 1028   CO2 24 03/25/2014 1032   BUN 24.9 03/25/2014 1032   CREATININE 1.5* 03/25/2014 1032      Component Value Date/Time   CALCIUM 9.6 03/25/2014 1032   ALKPHOS 112 03/25/2014 1032   AST 16 03/25/2014 1032   ALT 15 03/25/2014 1032   BILITOT 0.36 03/25/2014 1032       RADIOGRAPHIC STUDIES:  ASSESSMENT AND PLAN: this is a very pleasant 79 years old white male with history of stage IIIB non-small cell lung cancer status post concurrent chemoradiation followed by consolidation chemotherapy. He had evidence for disease recurrence and the patient was started on systemic chemotherapy with carboplatin and Alimta discontinued secondary to disease progression. He is currently undergoing immunotherapy with single agent Nivolumab status post 6 cycles. He is tolerating his treatment fairly well with no significant adverse effects. I recommended for the patient to proceed with cycle #7 today as scheduled. He would come back for follow up visit in 2 weeks for evaluation before starting cycle #8. For the low back pain and numbness I recommended for the patient to have MRI of the lumbar spine for evaluation of this area but he would like to wait for a few more days to see if he has any improvement. He was advised to call immediately if he has any concerning symptoms in the interval. The  patient voices understanding of current disease status and treatment options and is in agreement with the current care plan.  All questions were answered. The patient knows to call the clinic with any problems, questions or concerns. We can certainly see the patient much sooner if necessary.  Disclaimer: This note was dictated with voice recognition software. Similar sounding words can inadvertently be transcribed and may not be corrected upon review.

## 2014-04-08 NOTE — Patient Instructions (Signed)
Nivolumab injection What is this medicine? NIVOLUMAB (nye VOL ue mab) is used to treat certain types of melanoma and lung cancer. This medicine may be used for other purposes; ask your health care provider or pharmacist if you have questions. COMMON BRAND NAME(S): Opdivo What should I tell my health care provider before I take this medicine? They need to know if you have any of these conditions: -eye disease, vision problems -history of pancreatitis -immune system problems -inflammatory bowel disease -kidney disease -liver disease -lung disease -lupus -myasthenia gravis -multiple sclerosis -organ transplant -stomach or intestine problems -thyroid disease -tingling of the fingers or toes, or other nerve disorder -an unusual or allergic reaction to nivolumab, other medicines, foods, dyes, or preservatives -pregnant or trying to get pregnant -breast-feeding How should I use this medicine? This medicine is for infusion into a vein. It is given by a health care professional in a hospital or clinic setting. A special MedGuide will be given to you before each treatment. Be sure to read this information carefully each time. Talk to your pediatrician regarding the use of this medicine in children. Special care may be needed. Overdosage: If you think you've taken too much of this medicine contact a poison control center or emergency room at once. Overdosage: If you think you have taken too much of this medicine contact a poison control center or emergency room at once. NOTE: This medicine is only for you. Do not share this medicine with others. What if I miss a dose? It is important not to miss your dose. Call your doctor or health care professional if you are unable to keep an appointment. What may interact with this medicine? Interactions have not been studied. This list may not describe all possible interactions. Give your health care provider a list of all the medicines, herbs,  non-prescription drugs, or dietary supplements you use. Also tell them if you smoke, drink alcohol, or use illegal drugs. Some items may interact with your medicine. What should I watch for while using this medicine? Tell your doctor or healthcare professional if your symptoms do not start to get better or if they get worse. Your condition will be monitored carefully while you are receiving this medicine. You may need blood work done while you are taking this medicine. What side effects may I notice from receiving this medicine? Side effects that you should report to your doctor or health care professional as soon as possible: -allergic reactions like skin rash, itching or hives, swelling of the face, lips, or tongue -black, tarry stools -bloody or watery diarrhea -changes in vision -chills -cough -depressed mood -eye pain -feeling anxious -fever -general ill feeling or flu-like symptoms -hair loss -loss of appetite -low blood counts - this medicine may decrease the number of white blood cells, red blood cells and platelets. You may be at increased risk for infections and bleeding -pain, tingling, numbness in the hands or feet -redness, blistering, peeling or loosening of the skin, including inside the mouth -red pinpoint spots on skin -signs of decreased platelets or bleeding - bruising, pinpoint red spots on the skin, black, tarry stools, blood in the urine -signs of decreased red blood cells - unusually weak or tired, feeling faint or lightheaded, falls -signs of infection - fever or chills, cough, sore throat, pain or trouble passing urine -signs and symptoms of a dangerous change in heartbeat or heart rhythm like chest pain; dizziness; fast or irregular heartbeat; palpitations; feeling faint or lightheaded, falls; breathing problems -signs  and symptoms of high blood sugar such as dizziness; dry mouth; dry skin; fruity breath; nausea; stomach pain; increased hunger or thirst; increased  urination -signs and symptoms of kidney injury like trouble passing urine or change in the amount of urine -signs and symptoms of liver injury like dark yellow or brown urine; general ill feeling or flu-like symptoms; light-colored stools; loss of appetite; nausea; right upper belly pain; unusually weak or tired; yellowing of the eyes or skin -signs and symptoms of increased potassium like muscle weakness; chest pain; or fast, irregular heartbeat -signs and symptoms of low potassium like muscle cramps or muscle pain; chest pain; dizziness; feeling faint or lightheaded, falls; palpitations; breathing problems; or fast, irregular heartbeat -swelling of the ankles, feet, hands -weight gainSide effects that usually do not require medical attention (report to your doctor or health care professional if they continue or are bothersome): -constipation -general ill feeling or flu-like symptoms -hair loss -loss of appetite -nausea, vomiting This list may not describe all possible side effects. Call your doctor for medical advice about side effects. You may report side effects to FDA at 1-800-FDA-1088. Where should I keep my medicine? This drug is given in a hospital or clinic and will not be stored at home. NOTE: This sheet is a summary. It may not cover all possible information. If you have questions about this medicine, talk to your doctor, pharmacist, or health care provider.  2015, [employer]. (2013-08-05 13:18:19)

## 2014-04-15 ENCOUNTER — Other Ambulatory Visit: Payer: Medicare Other

## 2014-04-15 ENCOUNTER — Telehealth: Payer: Self-pay | Admitting: Medical Oncology
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
## 2014-04-22 ENCOUNTER — Other Ambulatory Visit: Payer: Self-pay | Admitting: *Deleted

## 2014-04-22 ENCOUNTER — Encounter: Payer: Self-pay | Admitting: Physician Assistant

## 2014-04-22 ENCOUNTER — Ambulatory Visit (HOSPITAL_BASED_OUTPATIENT_CLINIC_OR_DEPARTMENT_OTHER): Payer: Medicare Other

## 2014-04-22 ENCOUNTER — Other Ambulatory Visit: Payer: Medicare Other

## 2014-04-22 ENCOUNTER — Ambulatory Visit (HOSPITAL_BASED_OUTPATIENT_CLINIC_OR_DEPARTMENT_OTHER): Payer: Medicare Other | Admitting: Physician Assistant

## 2014-04-22 ENCOUNTER — Other Ambulatory Visit (HOSPITAL_BASED_OUTPATIENT_CLINIC_OR_DEPARTMENT_OTHER): Payer: Medicare Other

## 2014-04-22 ENCOUNTER — Telehealth: Payer: Self-pay | Admitting: Internal Medicine

## 2014-04-22 ENCOUNTER — Ambulatory Visit (HOSPITAL_COMMUNITY)
Admission: RE | Admit: 2014-04-22 | Discharge: 2014-04-22 | Disposition: A | Discharge status: Home / Self Care | Payer: Medicare Other | Source: Ambulatory Visit | Attending: Physician Assistant | Admitting: Physician Assistant

## 2014-04-22 VITALS — BP 151/95 | HR 117 | Temp 97.8°F | Resp 18 | Ht 65.0 in | Wt 204.3 lb

## 2014-04-22 DIAGNOSIS — C3432 Malignant neoplasm of lower lobe, left bronchus or lung: Secondary | ICD-10-CM

## 2014-04-22 DIAGNOSIS — N289 Disorder of kidney and ureter, unspecified: Secondary | ICD-10-CM

## 2014-04-22 DIAGNOSIS — R399 Unspecified symptoms and signs involving the genitourinary system: Secondary | ICD-10-CM

## 2014-04-22 DIAGNOSIS — N189 Chronic kidney disease, unspecified: Secondary | ICD-10-CM | POA: Insufficient documentation

## 2014-04-22 DIAGNOSIS — I10 Essential (primary) hypertension: Secondary | ICD-10-CM

## 2014-04-22 DIAGNOSIS — R339 Retention of urine, unspecified: Secondary | ICD-10-CM

## 2014-04-22 DIAGNOSIS — N1330 Unspecified hydronephrosis: Secondary | ICD-10-CM | POA: Insufficient documentation

## 2014-04-22 DIAGNOSIS — C3410 Malignant neoplasm of upper lobe, unspecified bronchus or lung: Secondary | ICD-10-CM

## 2014-04-22 DIAGNOSIS — C3411 Malignant neoplasm of upper lobe, right bronchus or lung: Secondary | ICD-10-CM

## 2014-04-22 DIAGNOSIS — R944 Abnormal results of kidney function studies: Secondary | ICD-10-CM

## 2014-04-22 DIAGNOSIS — E876 Hypokalemia: Secondary | ICD-10-CM

## 2014-04-22 DIAGNOSIS — M549 Dorsalgia, unspecified: Secondary | ICD-10-CM

## 2014-04-22 DIAGNOSIS — C3490 Malignant neoplasm of unspecified part of unspecified bronchus or lung: Secondary | ICD-10-CM

## 2014-04-22 LAB — CBC WITH DIFFERENTIAL/PLATELET
BASO%: 0.8 % (ref 0.0–2.0)
Basophils Absolute: 0.1 10*3/uL (ref 0.0–0.1)
EOS ABS: 0.2 10*3/uL (ref 0.0–0.5)
EOS%: 1.5 % (ref 0.0–7.0)
HCT: 34.9 % — ABNORMAL LOW (ref 38.4–49.9)
HEMOGLOBIN: 11.4 g/dL — AB (ref 13.0–17.1)
LYMPH#: 1.1 10*3/uL (ref 0.9–3.3)
LYMPH%: 10.7 % — ABNORMAL LOW (ref 14.0–49.0)
MCH: 30.9 pg (ref 27.2–33.4)
MCHC: 32.8 g/dL (ref 32.0–36.0)
MCV: 94 fL (ref 79.3–98.0)
MONO#: 0.7 10*3/uL (ref 0.1–0.9)
MONO%: 7.1 % (ref 0.0–14.0)
NEUT%: 79.9 % — AB (ref 39.0–75.0)
NEUTROS ABS: 7.9 10*3/uL — AB (ref 1.5–6.5)
Platelets: 271 10*3/uL (ref 140–400)
RBC: 3.71 10*6/uL — ABNORMAL LOW (ref 4.20–5.82)
RDW: 14 % (ref 11.0–14.6)
WBC: 9.8 10*3/uL (ref 4.0–10.3)

## 2014-04-22 LAB — URINALYSIS, MICROSCOPIC - CHCC
BILIRUBIN (URINE): NEGATIVE
Blood: NEGATIVE
Glucose: NEGATIVE mg/dL
Ketones: NEGATIVE mg/dL
LEUKOCYTE ESTERASE: NEGATIVE
NITRITE: NEGATIVE
PROTEIN: NEGATIVE mg/dL
RBC / HPF: NEGATIVE (ref 0–2)
Specific Gravity, Urine: 1.01 (ref 1.003–1.035)
Urobilinogen, UR: 0.2 mg/dL (ref 0.2–1)
pH: 6 (ref 4.6–8.0)

## 2014-04-22 LAB — COMPREHENSIVE METABOLIC PANEL (CC13)
ALT: 26 U/L (ref 0–55)
ANION GAP: 13 meq/L — AB (ref 3–11)
AST: 24 U/L (ref 5–34)
Albumin: 3.3 g/dL — ABNORMAL LOW (ref 3.5–5.0)
Alkaline Phosphatase: 92 U/L (ref 40–150)
BUN: 43.6 mg/dL — ABNORMAL HIGH (ref 7.0–26.0)
CALCIUM: 9.5 mg/dL (ref 8.4–10.4)
CHLORIDE: 101 meq/L (ref 98–109)
CO2: 25 meq/L (ref 22–29)
CREATININE: 2 mg/dL — AB (ref 0.7–1.3)
GLUCOSE: 104 mg/dL (ref 70–140)
Potassium: 3.3 mEq/L — ABNORMAL LOW (ref 3.5–5.1)
Sodium: 138 mEq/L (ref 136–145)
Total Bilirubin: 0.37 mg/dL (ref 0.20–1.20)
Total Protein: 7.1 g/dL (ref 6.4–8.3)

## 2014-04-22 LAB — MAGNESIUM (CC13): Magnesium: 2 mg/dl (ref 1.5–2.5)

## 2014-04-22 MED ORDER — OXYCODONE-ACETAMINOPHEN 5-325 MG PO TABS: 1.0000 | ORAL_TABLET | Freq: Four times a day (QID) | ORAL | Status: DC | PRN

## 2014-04-22 MED ORDER — SODIUM CHLORIDE 0.9 % IV SOLN
INTRAVENOUS | Status: DC
  Administered 2014-04-22: 13:00:00 via INTRAVENOUS

## 2014-04-22 NOTE — Progress Notes (Signed)
Pt to get 1L of fluids today in place of nivolumab

## 2014-04-22 NOTE — Patient Instructions (Signed)

## 2014-04-22 NOTE — Patient Instructions (Signed)
We are obtaining several tests to evaluate your kidney function today Discontiue the use of advil and similar products. Take the Percocet as prescribed as needed for pain Your immunotherapy has been postponed by one week Follow up again in 3 weeks

## 2014-04-22 NOTE — Telephone Encounter (Signed)
gv adn printeda ppt scheda nd avs fo rpt for NOV and DEC...sed added tx.

## 2014-04-22 NOTE — Progress Notes (Signed)
Pt cancelled today per Awilda Metro, [facility]

## 2014-04-22 NOTE — Progress Notes (Addendum)
.      Logan Russell [phone]   [phone]  OFFICE PROGRESS NOTE  [name], MD [place] [address]  DIAGNOSIS: Stage IIIB (T2a., N3, M0) non-small cell lung cancer consistent with invasive adenocarcinoma with negative ALK gene translocation and negative EGFR mutation diagnosed in October of 2013   PRIOR THERAPY:  1) Concurrent chemoradiation with weekly carboplatin for AUC of 2 and paclitaxel 45 mg/M2, last dose was given 06/11/2012 with partial response.  2) Consolidation chemotherapy with carboplatin for AUC of 5 and Alimta 500 mg/M2 every 3 weeks. Status post 3 cycles last dose was given 09/12/2012 with stable disease.  3) Systemic chemotherapy again with carboplatin for AUC of 5 and Alimta 500 mg/M2 every 3 weeks. First cycle expected on 11/13/2013. Status post 3 cycles, discontinued secondary to disease progression. Carboplatin was discontinued starting from cycle #2 secondary to hypersensitivity reaction.  CURRENT THERAPY: Immunotherapy with single agent Nivolumab 3 MG/TG every 2 weeks status post 7 cycles.   CHEMOTHERAPY INTENT: Palliative  CURRENT # OF CHEMOTHERAPY CYCLES: 7  CURRENT ANTIEMETICS: N/A  CURRENT SMOKING STATUS: Former Smoker  ORAL CHEMOTHERAPY AND CONSENT: None  CURRENT BISPHOSPHONATES USE: None  PAIN MANAGEMENT: 0/10  NARCOTICS INDUCED CONSTIPATION: None  LIVING WILL AND CODE STATUS: no code Blue  INTERVAL HISTORY: Logan Russell 79 y.o. male returns to the clinic today for three-month follow up visit accompanied by his wife. He is tolerating his treatment with Nivolumab fairly well with no significant adverse effects. He complains of increased back pain to the point that he is sleeping at a table. He reports that he was seen by his primary care physician, Dr. Arelia Sneddon for urinary retention 2 weeks ago. He was placed on Flomax but is still having some "dribbling". He denies any hematuria.  He states that his primary care physician also increased his fluid pill to a stronger Maxide tablet also over the past 2 weeks. He has been taking ibuprofen 400-600 mg by mouth 2-3 times per day.  He denied having any nausea or vomiting, no fever or chills, no diarrhea or skin rash. The patient denied having any significant weight loss or night sweats. He has no chest pain but continues to have shortness breath with exertion with no cough or hemoptysis. Shortness of breath is at baseline. He is here today for cycle #8.  MEDICAL HISTORY: Past Medical History  Diagnosis Date  . Hypertension   . History of radiation therapy 05/02/12-06/19/12    left lung mediastinal lymph nodes 66Gy/35fx  . Lung cancer 04/12/2012    ALLERGIES:  has No Known Allergies.  MEDICATIONS:  Current Outpatient Prescriptions  Medication Sig Dispense Refill  . folic acid (FOLVITE) 1 MG tablet Take 1 tablet (1 mg total) by mouth daily. 30 tablet 4  . guaiFENesin (MUCINEX) 600 MG 12 hr tablet Take 1,200 mg by mouth 2 (two) times daily.    Marland Kitchen ibuprofen (ADVIL,MOTRIN) 200 MG tablet Take 400 mg by mouth every 6 (six) hours as needed.    . triamterene-hydrochlorothiazide (MAXZIDE) 75-50 MG per tablet Take 1 tablet by mouth daily.    Marland Kitchen albuterol (PROVENTIL HFA;VENTOLIN HFA) 108 (90 BASE) MCG/ACT inhaler Inhale 2 puffs into the lungs every 6 (six) hours as needed for wheezing or shortness of breath. (Patient not taking: Reported on 04/22/2014) 1 Inhaler 2  . oxyCODONE-acetaminophen (PERCOCET/ROXICET) 5-325 MG per tablet Take 1 tablet by mouth every 6 (six) hours as needed for severe pain.  30 tablet 0   Current Facility-Administered Medications  Medication Dose Route Frequency Provider Last Rate Last Dose  . 0.9 %  sodium chloride infusion   Intravenous Continuous Carlton Adam, PA-C   Stopped at 04/22/14 1500    REVIEW OF SYSTEMS:  Constitutional: positive for fatigue Eyes: negative Ears, nose, mouth, throat, and face:  negative Respiratory: positive for dyspnea on exertion Cardiovascular: negative Gastrointestinal: negative Genitourinary:positive for decreased stream Integument/breast: negative Hematologic/lymphatic: negative Musculoskeletal:positive for back pain Neurological: negative Behavioral/Psych: negative Endocrine: negative Allergic/Immunologic: negative   PHYSICAL EXAMINATION: General appearance: alert, cooperative and no distress Head: Normocephalic, without obvious abnormality, atraumatic Neck: no adenopathy Lymph nodes: Cervical, supraclavicular, and axillary nodes normal. Resp: clear to auscultation bilaterally Cardio: regular rate and rhythm, S1, S2 normal, no murmur, click, rub or gallop GI: soft, non-tender; bowel sounds normal; no masses,  no organomegaly Extremities: edema 1+ to 2+ soft tissue edema bilateral lower extremities  ECOG PERFORMANCE STATUS: 1 - Symptomatic but completely ambulatory  Blood pressure 151/95, pulse 117, temperature 97.8 F (36.6 C), temperature source Oral, resp. rate 18, height $RemoveBe'5\' 5"'[id number]$  (1.651 m), weight 204 lb 4.8 oz (92.67 kg), SpO2 100 %.  LABORATORY DATA: Lab Results  Component Value Date   WBC 9.8 04/22/2014   HGB 11.4* 04/22/2014   HCT 34.9* 04/22/2014   MCV 94.0 04/22/2014   PLT 271 04/22/2014      Chemistry      Component Value Date/Time   NA 138 04/22/2014 1100   K 3.3* 04/22/2014 1100   CL 106 10/08/2012 1028   CO2 25 04/22/2014 1100   BUN 43.6* 04/22/2014 1100   CREATININE 2.0* 04/22/2014 1100      Component Value Date/Time   CALCIUM 9.5 04/22/2014 1100   ALKPHOS 92 04/22/2014 1100   AST 24 04/22/2014 1100   ALT 26 04/22/2014 1100   BILITOT 0.37 04/22/2014 1100       RADIOGRAPHIC STUDIES:  ASSESSMENT AND PLAN: this is a very pleasant 79 years old white male with history of stage IIIB non-small cell lung cancer status post concurrent chemoradiation followed by consolidation chemotherapy. He had evidence for disease  recurrence and the patient was started on systemic chemotherapy with carboplatin and Alimta discontinued secondary to disease progression. He is currently undergoing immunotherapy with single agent Nivolumab status post 7 cycles. He is tolerating his treatment fairly well with no significant adverse effects. Patient was discussed with and also seen by Dr. Julien Nordmann. His creatinine has increased to 2.0 which is concerning. It is possible that this may be related to his use of ibuprofen as well as increased diuretics however cannot rule out a mild nephritis. Patient will be sent for a clean catch urinalysis as well as a urine culture and sensitivity. We will also obtain bilateral renal ultrasound to rule out possible obstruction. His potassium level was slightly low at 3.3 and we have asked him to increase his dietary intake of potassium-rich foods. He is to follow with his primary care physician regarding possibly decreasing or discontinuing the diuretic as this is likely distressing his renal function. He is apparently on the [facility] for antihypertensive purposes and a another antihypertensive medication may need to be instituted. We will postpone cycle #8 of his immunotherapy with Nivolumab by one week. Today he will receive 1 L of normal saline, IV fluids, to help with his renal insufficiency.  He would come back for follow up visit in 3 weeks for evaluation before starting cycle #9. If the back  pain persists we will pursue MRI of the lumbar spine for further evaluation of this area.  He was advised to call immediately if he has any concerning symptoms in the interval. The patient voices understanding of current disease status and treatment options and is in agreement with the current care plan.  All questions were answered. The patient knows to call the clinic with any problems, questions or concerns. We can certainly see the patient much sooner if necessary.  Carlton Adam,  PA-C 04/22/2014  ADDENDUM: Hematology/Oncology Attending: I had a face to face encounter with the patient today. I recommended his care plan. This is a very pleasant 79 years old white male with metastatic non-small cell lung cancer currently undergoing immunotherapy with Nivolumab status post 7 cycles. The patient has been tolerating his treatment fairly well with no significant adverse effects. It was noted today that his serum creatinine has been elevated and the patient also complains of urinary retention. He has been on treatment with Maxide for hypertension by his primary care physician. The patient also has poor by mouth intake recently. I recommended for him to hold his treatment with Nivolumab for now until improvement in his kidney function. We will arrange for the patient to receive 1 L of normal saline today. I would also check urinalysis in addition to renal Doppler to rule out any obstructive uropathy. He would come back for follow-up visit in one week for reevaluation and repeat blood work before resuming his immunotherapy. He was advised to call immediately if he has any concerning symptoms in the interval.  Disclaimer: This note was dictated with voice recognition software. Similar sounding words can inadvertently be transcribed and may not be corrected upon review. Eilleen Kempf., MD 04/22/2014

## 2014-04-23 LAB — URINE CULTURE

## 2014-04-25 ENCOUNTER — Other Ambulatory Visit: Payer: Self-pay | Admitting: Physician Assistant

## 2014-04-25 DIAGNOSIS — N1330 Unspecified hydronephrosis: Secondary | ICD-10-CM

## 2014-04-25 DIAGNOSIS — C3490 Malignant neoplasm of unspecified part of unspecified bronchus or lung: Secondary | ICD-10-CM

## 2014-04-28 ENCOUNTER — Telehealth: Payer: Self-pay | Admitting: Internal Medicine

## 2014-04-28 NOTE — Telephone Encounter (Signed)
Confirm appt d/t for 05/26/14 @ 9:45 referral appt.

## 2014-04-29 ENCOUNTER — Other Ambulatory Visit (HOSPITAL_BASED_OUTPATIENT_CLINIC_OR_DEPARTMENT_OTHER): Payer: Medicare Other

## 2014-04-29 ENCOUNTER — Telehealth: Payer: Self-pay | Admitting: Internal Medicine

## 2014-04-29 ENCOUNTER — Ambulatory Visit (HOSPITAL_BASED_OUTPATIENT_CLINIC_OR_DEPARTMENT_OTHER): Payer: Medicare Other

## 2014-04-29 ENCOUNTER — Other Ambulatory Visit: Payer: Self-pay | Admitting: Internal Medicine

## 2014-04-29 ENCOUNTER — Other Ambulatory Visit: Payer: Self-pay | Admitting: Physician Assistant

## 2014-04-29 DIAGNOSIS — C3432 Malignant neoplasm of lower lobe, left bronchus or lung: Secondary | ICD-10-CM

## 2014-04-29 DIAGNOSIS — C3490 Malignant neoplasm of unspecified part of unspecified bronchus or lung: Secondary | ICD-10-CM

## 2014-04-29 DIAGNOSIS — C3410 Malignant neoplasm of upper lobe, unspecified bronchus or lung: Secondary | ICD-10-CM

## 2014-04-29 DIAGNOSIS — Z5112 Encounter for antineoplastic immunotherapy: Secondary | ICD-10-CM

## 2014-04-29 LAB — CBC WITH DIFFERENTIAL/PLATELET
BASO%: 0.6 % (ref 0.0–2.0)
BASOS ABS: 0.1 10*3/uL (ref 0.0–0.1)
EOS%: 1.7 % (ref 0.0–7.0)
Eosinophils Absolute: 0.2 10*3/uL (ref 0.0–0.5)
HEMATOCRIT: 35.8 % — AB (ref 38.4–49.9)
HEMOGLOBIN: 11.7 g/dL — AB (ref 13.0–17.1)
LYMPH%: 8 % — ABNORMAL LOW (ref 14.0–49.0)
MCH: 30.8 pg (ref 27.2–33.4)
MCHC: 32.7 g/dL (ref 32.0–36.0)
MCV: 94.1 fL (ref 79.3–98.0)
MONO#: 0.5 10*3/uL (ref 0.1–0.9)
MONO%: 5.6 % (ref 0.0–14.0)
NEUT#: 7.7 10*3/uL — ABNORMAL HIGH (ref 1.5–6.5)
NEUT%: 84.1 % — AB (ref 39.0–75.0)
Platelets: 242 10*3/uL (ref 140–400)
RBC: 3.8 10*6/uL — ABNORMAL LOW (ref 4.20–5.82)
RDW: 13.8 % (ref 11.0–14.6)
WBC: 9.2 10*3/uL (ref 4.0–10.3)
lymph#: 0.7 10*3/uL — ABNORMAL LOW (ref 0.9–3.3)

## 2014-04-29 LAB — COMPREHENSIVE METABOLIC PANEL (CC13)
ALK PHOS: 102 U/L (ref 40–150)
ALT: 20 U/L (ref 0–55)
AST: 21 U/L (ref 5–34)
Albumin: 3.3 g/dL — ABNORMAL LOW (ref 3.5–5.0)
Anion Gap: 12 mEq/L — ABNORMAL HIGH (ref 3–11)
BUN: 29.8 mg/dL — AB (ref 7.0–26.0)
CO2: 25 mEq/L (ref 22–29)
CREATININE: 2 mg/dL — AB (ref 0.7–1.3)
Calcium: 9.5 mg/dL (ref 8.4–10.4)
Chloride: 103 mEq/L (ref 98–109)
Glucose: 102 mg/dl (ref 70–140)
Potassium: 3.7 mEq/L (ref 3.5–5.1)
Sodium: 139 mEq/L (ref 136–145)
Total Bilirubin: 0.32 mg/dL (ref 0.20–1.20)
Total Protein: 7.5 g/dL (ref 6.4–8.3)

## 2014-04-29 MED ORDER — NIVOLUMAB CHEMO INJECTION 100 MG/10ML
3.0000 mg/kg | Freq: Once | INTRAVENOUS | Status: AC
  Administered 2014-04-29: 260 mg via INTRAVENOUS
  Filled 2014-04-29: qty 26

## 2014-04-29 MED ORDER — SODIUM CHLORIDE 0.9 % IV SOLN
Freq: Once | INTRAVENOUS | Status: AC
  Administered 2014-04-29: 16:00:00 via INTRAVENOUS

## 2014-04-29 NOTE — Progress Notes (Signed)
Reviewed labs with Dr. Julien Nordmann.  Creat.2.0 and BUN 29.8.   VO given to treat with nivolumab despite these labs.  Dr. Julien Nordmann will sign the orders.  Dr. Julien Nordmann inquires about urology appt.  Let Simon Rhein RN know that wife states appt. Is 12/28 and that is the soonest they could get with [facility].  Wife is not sure which MD at that practice he will see.

## 2014-04-29 NOTE — Progress Notes (Signed)
@   4:45 pm  #14 fr. Foley catheter placed without difficulty or resistance. Balloon inflated with 10cc sterile water. Drained 1200cc cloudy pale yellow urine. Pt. Tolerated well with only a small amount of bladder spasm towards end of bladder being drained.  Foley to be kept in until Urology appt. With Dr. Janice Norrie on Thursday, Dec. 3rd @ 9am.  Teaching done with wife and patient as to care of foley and emptying of drainage bag.  Both voiced understanding. Supplied wife with gloves and urinal for draining foley bag.

## 2014-04-29 NOTE — Telephone Encounter (Signed)
per AJ contacted [facility] for a sooner appt. s/w gwynn in triage and gwynn aware pt cannot void and will call pt and s/w re new appt. gwynn also aware AJ/MM waiting to know when pt will be seen. per gwynn she will also call me back re sooner appt. AJ aware.

## 2014-04-29 NOTE — Patient Instructions (Signed)
[facility] Discharge Instructions for Patients Receiving Chemotherapy  Today you received the following chemotherapy agents Nivolumab  To help prevent nausea and vomiting after your treatment, we encourage you to take your nausea medication Call MD office as needed.  If you develop nausea and vomiting that is not controlled by your nausea medication, call the clinic.   BELOW ARE SYMPTOMS THAT SHOULD BE REPORTED IMMEDIATELY:  *FEVER GREATER THAN 100.5 F  *CHILLS WITH OR WITHOUT FEVER  NAUSEA AND VOMITING THAT IS NOT CONTROLLED WITH YOUR NAUSEA MEDICATION  *UNUSUAL SHORTNESS OF BREATH  *UNUSUAL BRUISING OR BLEEDING  TENDERNESS IN MOUTH AND THROAT WITH OR WITHOUT PRESENCE OF ULCERS  *URINARY PROBLEMS  *BOWEL PROBLEMS  UNUSUAL RASH Items with * indicate a potential emergency and should be followed up as soon as possible.  Feel free to call the clinic you have any questions or concerns. The clinic phone number is [phone].

## 2014-04-30 ENCOUNTER — Telehealth: Payer: Self-pay | Admitting: Internal Medicine

## 2014-04-30 ENCOUNTER — Telehealth: Payer: Self-pay | Admitting: *Deleted

## 2014-04-30 NOTE — Telephone Encounter (Signed)
gwynn at [facility] called back yesterday and gv me an appt for pt for 12/3 @ 9am. gwynn has not been able to reach pt and neither has this office. 2 message from our office has been left for pt re this appt and asking that he call back asap to confirm as it is urgent that he make this appt. AJ aware.

## 2014-04-30 NOTE — Telephone Encounter (Signed)
per Delos Haring pt was taken care of by dr Janice Norrie today.

## 2014-04-30 NOTE — Telephone Encounter (Signed)
Pt's wife spoke to Andrews, Therapist, sports at [facility] this morning reporting that pt was having hematuria from his foley catheter.  Per Dr Vista Mink, pt needs to go to the ED to be evaluated.  Called pt's wife back, she states they have contacted [facility] and they have an appointment at 10:30am this morning.  Ok per Dr Vista Mink.

## 2014-05-01 ENCOUNTER — Telehealth: Payer: Self-pay | Admitting: *Deleted

## 2014-05-01 NOTE — Telephone Encounter (Signed)
Office note from [facility] dated 04/30/14 given to Dr Vista Mink to review.  Foley catheter placed at [facility] 04/29/14 has been left in place per Dr Janice Norrie and pt will come back in 1 week for renal US and voiding trial.

## 2014-05-06 ENCOUNTER — Ambulatory Visit: Payer: Medicare Other

## 2014-05-06 ENCOUNTER — Other Ambulatory Visit: Payer: Medicare Other

## 2014-05-12 ENCOUNTER — Other Ambulatory Visit: Payer: Self-pay | Admitting: *Deleted

## 2014-05-12 DIAGNOSIS — C3411 Malignant neoplasm of upper lobe, right bronchus or lung: Secondary | ICD-10-CM

## 2014-05-13 ENCOUNTER — Other Ambulatory Visit (HOSPITAL_BASED_OUTPATIENT_CLINIC_OR_DEPARTMENT_OTHER): Payer: Medicare Other

## 2014-05-13 ENCOUNTER — Encounter: Payer: Self-pay | Admitting: Internal Medicine

## 2014-05-13 ENCOUNTER — Ambulatory Visit: Payer: Medicare Other

## 2014-05-13 ENCOUNTER — Ambulatory Visit (HOSPITAL_BASED_OUTPATIENT_CLINIC_OR_DEPARTMENT_OTHER): Payer: Medicare Other | Admitting: Internal Medicine

## 2014-05-13 ENCOUNTER — Telehealth: Payer: Self-pay | Admitting: Internal Medicine

## 2014-05-13 VITALS — BP 144/76 | HR 112 | Temp 97.6°F | Resp 21 | Ht 65.0 in | Wt 192.0 lb

## 2014-05-13 DIAGNOSIS — C3411 Malignant neoplasm of upper lobe, right bronchus or lung: Secondary | ICD-10-CM

## 2014-05-13 DIAGNOSIS — C3432 Malignant neoplasm of lower lobe, left bronchus or lung: Secondary | ICD-10-CM

## 2014-05-13 LAB — CBC WITH DIFFERENTIAL/PLATELET
BASO%: 0.4 % (ref 0.0–2.0)
Basophils Absolute: 0 10*3/uL (ref 0.0–0.1)
EOS ABS: 0.3 10*3/uL (ref 0.0–0.5)
EOS%: 3.3 % (ref 0.0–7.0)
HCT: 35.2 % — ABNORMAL LOW (ref 38.4–49.9)
HGB: 11.8 g/dL — ABNORMAL LOW (ref 13.0–17.1)
LYMPH%: 13.2 % — ABNORMAL LOW (ref 14.0–49.0)
MCH: 30.8 pg (ref 27.2–33.4)
MCHC: 33.5 g/dL (ref 32.0–36.0)
MCV: 91.9 fL (ref 79.3–98.0)
MONO#: 0.6 10*3/uL (ref 0.1–0.9)
MONO%: 6.8 % (ref 0.0–14.0)
NEUT%: 76.3 % — ABNORMAL HIGH (ref 39.0–75.0)
NEUTROS ABS: 6.2 10*3/uL (ref 1.5–6.5)
PLATELETS: 239 10*3/uL (ref 140–400)
RBC: 3.83 10*6/uL — ABNORMAL LOW (ref 4.20–5.82)
RDW: 14 % (ref 11.0–14.6)
WBC: 8.1 10*3/uL (ref 4.0–10.3)
lymph#: 1.1 10*3/uL (ref 0.9–3.3)
nRBC: 0 % (ref 0–0)

## 2014-05-13 LAB — COMPREHENSIVE METABOLIC PANEL (CC13)
ALT: 14 U/L (ref 0–55)
ANION GAP: 16 meq/L — AB (ref 3–11)
AST: 17 U/L (ref 5–34)
Albumin: 3.4 g/dL — ABNORMAL LOW (ref 3.5–5.0)
Alkaline Phosphatase: 112 U/L (ref 40–150)
BUN: 24.9 mg/dL (ref 7.0–26.0)
CO2: 22 meq/L (ref 22–29)
Calcium: 9.3 mg/dL (ref 8.4–10.4)
Chloride: 98 mEq/L (ref 98–109)
Creatinine: 1.4 mg/dL — ABNORMAL HIGH (ref 0.7–1.3)
EGFR: 49 mL/min/{1.73_m2} — AB (ref >90)
Glucose: 97 mg/dl (ref 70–140)
Potassium: 2.8 mEq/L — CL (ref 3.5–5.1)
Sodium: 136 mEq/L (ref 136–145)
Total Bilirubin: 0.37 mg/dL (ref 0.20–1.20)
Total Protein: 7.2 g/dL (ref 6.4–8.3)

## 2014-05-13 MED ORDER — POTASSIUM CHLORIDE CRYS ER 20 MEQ PO TBCR: 20.0000 meq | EXTENDED_RELEASE_TABLET | Freq: Two times a day (BID) | ORAL | Status: DC

## 2014-05-13 NOTE — Telephone Encounter (Signed)
gv and printed appt sched and avs for pt for DEC....sed added tx.

## 2014-05-13 NOTE — Progress Notes (Signed)
[facility] Telephone:([phone]) [phone]   Fax:(336) [phone]  OFFICE PROGRESS NOTE  [name], MD [address] [place] [address]  DIAGNOSIS: Stage IIIB (T2a., N3, M0) non-small cell lung cancer consistent with invasive adenocarcinoma with negative ALK gene translocation and negative EGFR mutation diagnosed in October of 2013   PRIOR THERAPY:  1) Concurrent chemoradiation with weekly carboplatin for AUC of 2 and paclitaxel 45 mg/M2, last dose was given 06/11/2012 with partial response.  2) Consolidation chemotherapy with carboplatin for AUC of 5 and Alimta 500 mg/M2 every 3 weeks. Status post 3 cycles last dose was given 09/12/2012 with stable disease.  3) Systemic chemotherapy again with carboplatin for AUC of 5 and Alimta 500 mg/M2 every 3 weeks. First cycle expected on 11/13/2013. Status post 3 cycles, discontinued secondary to disease progression. Carboplatin was discontinued starting from cycle #2 secondary to hypersensitivity reaction.  CURRENT THERAPY: Immunotherapy with single agent Nivolumab 3 MG/TG every 2 weeks status post 8 cycles.   CHEMOTHERAPY INTENT: Palliative  CURRENT # OF CHEMOTHERAPY CYCLES: 8  CURRENT ANTIEMETICS: N/A  CURRENT SMOKING STATUS: Former Smoker  ORAL CHEMOTHERAPY AND CONSENT: None  CURRENT BISPHOSPHONATES USE: None  PAIN MANAGEMENT: 0/10  NARCOTICS INDUCED CONSTIPATION: None  LIVING WILL AND CODE STATUS: no code Blue  INTERVAL HISTORY: ARMANY MANO 79 y.o. male returns to the clinic today for follow-up visit accompanied by his wife. He is currently on treatment with Nivolumab status post 8 cycles and is tolerating his treatment fairly well with no significant adverse effects except for fatigue. He was recently diagnosed with urinary retention causing bilateral hydronephrosis with elevation of his renal function. He was seen by Dr. Brunilda Payor and currently has Foley's catheter placed He denied having any nausea  or vomiting, no fever or chills, no diarrhea or skin rash. The patient denied having any significant weight loss or night sweats. He denied having any significant chest pain but continues to have shortness breath with exertion with no cough or hemoptysis. He was supposed to start cycle #9 today.  MEDICAL HISTORY: Past Medical History  Diagnosis Date  . Hypertension   . History of radiation therapy 05/02/12-06/19/12    left lung mediastinal lymph nodes 66Gy/9fx  . Lung cancer 04/12/2012    ALLERGIES:  has No Known Allergies.  MEDICATIONS:  Current Outpatient Prescriptions  Medication Sig Dispense Refill  . folic acid (FOLVITE) 1 MG tablet Take 1 tablet (1 mg total) by mouth daily. 30 tablet 4  . guaiFENesin (MUCINEX) 600 MG 12 hr tablet Take 1,200 mg by mouth 2 (two) times daily.    Marland Kitchen triamterene-hydrochlorothiazide (MAXZIDE) 75-50 MG per tablet Take 1 tablet by mouth daily.     No current facility-administered medications for this visit.    REVIEW OF SYSTEMS:  Constitutional: positive for fatigue Eyes: negative Ears, nose, mouth, throat, and face: negative Respiratory: positive for dyspnea on exertion Cardiovascular: negative Gastrointestinal: negative Genitourinary:negative Integument/breast: negative Hematologic/lymphatic: negative Musculoskeletal:negative Neurological: negative Behavioral/Psych: negative Endocrine: negative Allergic/Immunologic: negative   PHYSICAL EXAMINATION: General appearance: alert, cooperative and no distress Head: Normocephalic, without obvious abnormality, atraumatic Neck: no adenopathy Lymph nodes: Cervical, supraclavicular, and axillary nodes normal. Resp: clear to auscultation bilaterally Cardio: regular rate and rhythm, S1, S2 normal, no murmur, click, rub or gallop GI: soft, non-tender; bowel sounds normal; no masses,  no organomegaly Extremities: extremities normal, atraumatic, no cyanosis or edema  ECOG PERFORMANCE STATUS: 1 -  Symptomatic but completely ambulatory  Blood  pressure 144/76, pulse 112, temperature 97.6 F (36.4 C), temperature source Oral, resp. rate 21, height $RemoveBe'5\' 5"'benzVpfJk$  (1.651 m), weight 192 lb (87.091 kg), SpO2 100 %.  LABORATORY DATA: Lab Results  Component Value Date   WBC 8.1 05/13/2014   HGB 11.8* 05/13/2014   HCT 35.2* 05/13/2014   MCV 91.9 05/13/2014   PLT 239 05/13/2014      Chemistry      Component Value Date/Time   NA 136 05/13/2014 0953   K 2.8* 05/13/2014 0953   CL 106 10/08/2012 1028   CO2 22 05/13/2014 0953   BUN 24.9 05/13/2014 0953   CREATININE 1.4* 05/13/2014 0953      Component Value Date/Time   CALCIUM 9.3 05/13/2014 0953   ALKPHOS 112 05/13/2014 0953   AST 17 05/13/2014 0953   ALT 14 05/13/2014 0953   BILITOT 0.37 05/13/2014 0953       RADIOGRAPHIC STUDIES:  ASSESSMENT AND PLAN: this is a very pleasant 79 years old white male with history of stage IIIB non-small cell lung cancer status post concurrent chemoradiation followed by consolidation chemotherapy. He had evidence for disease recurrence and the patient was started on systemic chemotherapy with carboplatin and Alimta discontinued secondary to disease progression. He is currently undergoing immunotherapy with single agent Nivolumab status post 8 cycles. He is tolerating his treatment fairly well with no significant adverse effects.  I recommended for the patient to proceed with cycle #9 of his treatment today but he would like to delay his treatment until after Christmas to have some quality time with his family and also to gain some strength before resuming his treatment. I will arrange for the patient to have repeat CT scan of the chest for evaluation of his disease before his next visit. He would come back for follow up visit in 2 weeks for evaluation before starting cycle #9. He was advised to call immediately if he has any concerning symptoms in the interval. The patient voices understanding of current  disease status and treatment options and is in agreement with the current care plan.  All questions were answered. The patient knows to call the clinic with any problems, questions or concerns. We can certainly see the patient much sooner if necessary.  Disclaimer: This note was dictated with voice recognition software. Similar sounding words can inadvertently be transcribed and may not be corrected upon review.

## 2014-05-19 ENCOUNTER — Other Ambulatory Visit: Payer: Self-pay | Admitting: Family Medicine

## 2014-05-19 DIAGNOSIS — M545 Low back pain, unspecified: Secondary | ICD-10-CM

## 2014-05-20 ENCOUNTER — Other Ambulatory Visit: Payer: Medicare Other

## 2014-05-20 ENCOUNTER — Ambulatory Visit: Payer: Medicare Other

## 2014-05-21 ENCOUNTER — Ambulatory Visit (HOSPITAL_COMMUNITY)
Admission: RE | Admit: 2014-05-21 | Discharge: 2014-05-21 | Disposition: A | Discharge status: Home / Self Care | Payer: Medicare Other | Source: Ambulatory Visit | Attending: Internal Medicine | Admitting: Internal Medicine

## 2014-05-21 DIAGNOSIS — R05 Cough: Secondary | ICD-10-CM | POA: Insufficient documentation

## 2014-05-21 DIAGNOSIS — C3411 Malignant neoplasm of upper lobe, right bronchus or lung: Secondary | ICD-10-CM | POA: Diagnosis not present

## 2014-05-21 DIAGNOSIS — R5383 Other fatigue: Secondary | ICD-10-CM | POA: Insufficient documentation

## 2014-05-21 DIAGNOSIS — Z9221 Personal history of antineoplastic chemotherapy: Secondary | ICD-10-CM | POA: Diagnosis not present

## 2014-05-24 ENCOUNTER — Ambulatory Visit
Admission: RE | Admit: 2014-05-24 | Discharge: 2014-05-24 | Disposition: A | Discharge status: Home / Self Care | Payer: Medicare Other | Source: Ambulatory Visit | Attending: Family Medicine | Admitting: Family Medicine

## 2014-05-24 DIAGNOSIS — M545 Low back pain, unspecified: Secondary | ICD-10-CM

## 2014-05-27 ENCOUNTER — Other Ambulatory Visit (HOSPITAL_BASED_OUTPATIENT_CLINIC_OR_DEPARTMENT_OTHER): Payer: Medicare Other

## 2014-05-27 ENCOUNTER — Inpatient Hospital Stay (HOSPITAL_COMMUNITY): Payer: Medicare Other | Admitting: Certified Registered Nurse Anesthetist

## 2014-05-27 ENCOUNTER — Ambulatory Visit (HOSPITAL_BASED_OUTPATIENT_CLINIC_OR_DEPARTMENT_OTHER): Payer: Medicare Other | Admitting: Physician Assistant

## 2014-05-27 ENCOUNTER — Ambulatory Visit: Payer: Medicare Other

## 2014-05-27 ENCOUNTER — Encounter (HOSPITAL_COMMUNITY): Payer: Self-pay | Admitting: Certified Registered Nurse Anesthetist

## 2014-05-27 ENCOUNTER — Telehealth: Payer: Self-pay | Admitting: *Deleted

## 2014-05-27 ENCOUNTER — Other Ambulatory Visit: Payer: Self-pay

## 2014-05-27 ENCOUNTER — Encounter: Payer: Self-pay | Admitting: Physician Assistant

## 2014-05-27 ENCOUNTER — Encounter (HOSPITAL_COMMUNITY)
Admission: AD | Disposition: A | Discharge status: Skilled Nursing Facility | Payer: Self-pay | Source: Ambulatory Visit | Attending: Family Medicine

## 2014-05-27 ENCOUNTER — Inpatient Hospital Stay (HOSPITAL_COMMUNITY): Payer: Medicare Other

## 2014-05-27 ENCOUNTER — Inpatient Hospital Stay (HOSPITAL_COMMUNITY)
Admission: AD | Admit: 2014-05-27 | Discharge: 2014-06-03 | DRG: 025 | Disposition: A | Discharge status: Skilled Nursing Facility | Payer: Medicare Other | Source: Ambulatory Visit | Attending: Internal Medicine | Admitting: Internal Medicine

## 2014-05-27 ENCOUNTER — Ambulatory Visit
Admission: RE | Admit: 2014-05-27 | Discharge: 2014-05-27 | Disposition: A | Discharge status: Home / Self Care | Payer: Medicare Other | Source: Ambulatory Visit | Attending: Radiation Oncology | Admitting: Radiation Oncology

## 2014-05-27 ENCOUNTER — Ambulatory Visit: Payer: Medicare Other | Admitting: Radiation Oncology

## 2014-05-27 ENCOUNTER — Other Ambulatory Visit: Payer: Self-pay | Admitting: *Deleted

## 2014-05-27 VITALS — BP 121/71 | HR 116 | Temp 98.0°F | Resp 18 | Ht 65.0 in | Wt 175.6 lb

## 2014-05-27 DIAGNOSIS — N179 Acute kidney failure, unspecified: Secondary | ICD-10-CM | POA: Diagnosis not present

## 2014-05-27 DIAGNOSIS — C7951 Secondary malignant neoplasm of bone: Secondary | ICD-10-CM

## 2014-05-27 DIAGNOSIS — I2510 Atherosclerotic heart disease of native coronary artery without angina pectoris: Secondary | ICD-10-CM | POA: Diagnosis present

## 2014-05-27 DIAGNOSIS — D649 Anemia, unspecified: Secondary | ICD-10-CM | POA: Diagnosis present

## 2014-05-27 DIAGNOSIS — R339 Retention of urine, unspecified: Secondary | ICD-10-CM | POA: Diagnosis present

## 2014-05-27 DIAGNOSIS — G9619 Other disorders of meninges, not elsewhere classified: Secondary | ICD-10-CM

## 2014-05-27 DIAGNOSIS — Z87891 Personal history of nicotine dependence: Secondary | ICD-10-CM

## 2014-05-27 DIAGNOSIS — C3411 Malignant neoplasm of upper lobe, right bronchus or lung: Secondary | ICD-10-CM

## 2014-05-27 DIAGNOSIS — C3490 Malignant neoplasm of unspecified part of unspecified bronchus or lung: Secondary | ICD-10-CM | POA: Diagnosis present

## 2014-05-27 DIAGNOSIS — E43 Unspecified severe protein-calorie malnutrition: Secondary | ICD-10-CM | POA: Diagnosis present

## 2014-05-27 DIAGNOSIS — E876 Hypokalemia: Secondary | ICD-10-CM | POA: Diagnosis present

## 2014-05-27 DIAGNOSIS — K5900 Constipation, unspecified: Secondary | ICD-10-CM | POA: Diagnosis not present

## 2014-05-27 DIAGNOSIS — Z923 Personal history of irradiation: Secondary | ICD-10-CM

## 2014-05-27 DIAGNOSIS — I10 Essential (primary) hypertension: Secondary | ICD-10-CM | POA: Diagnosis present

## 2014-05-27 DIAGNOSIS — N183 Chronic kidney disease, stage 3 (moderate): Secondary | ICD-10-CM | POA: Diagnosis present

## 2014-05-27 DIAGNOSIS — N1330 Unspecified hydronephrosis: Secondary | ICD-10-CM | POA: Diagnosis present

## 2014-05-27 DIAGNOSIS — G834 Cauda equina syndrome: Secondary | ICD-10-CM | POA: Diagnosis present

## 2014-05-27 DIAGNOSIS — C779 Secondary and unspecified malignant neoplasm of lymph node, unspecified: Secondary | ICD-10-CM | POA: Diagnosis present

## 2014-05-27 DIAGNOSIS — Z419 Encounter for procedure for purposes other than remedying health state, unspecified: Secondary | ICD-10-CM

## 2014-05-27 DIAGNOSIS — I129 Hypertensive chronic kidney disease with stage 1 through stage 4 chronic kidney disease, or unspecified chronic kidney disease: Secondary | ICD-10-CM | POA: Diagnosis present

## 2014-05-27 DIAGNOSIS — Z9221 Personal history of antineoplastic chemotherapy: Secondary | ICD-10-CM | POA: Insufficient documentation

## 2014-05-27 DIAGNOSIS — C7949 Secondary malignant neoplasm of other parts of nervous system: Principal | ICD-10-CM | POA: Diagnosis present

## 2014-05-27 DIAGNOSIS — C3432 Malignant neoplasm of lower lobe, left bronchus or lung: Secondary | ICD-10-CM

## 2014-05-27 DIAGNOSIS — G96198 Other disorders of meninges, not elsewhere classified: Secondary | ICD-10-CM | POA: Diagnosis present

## 2014-05-27 DIAGNOSIS — R159 Full incontinence of feces: Secondary | ICD-10-CM | POA: Insufficient documentation

## 2014-05-27 DIAGNOSIS — Z510 Encounter for antineoplastic radiation therapy: Secondary | ICD-10-CM | POA: Insufficient documentation

## 2014-05-27 DIAGNOSIS — R32 Unspecified urinary incontinence: Secondary | ICD-10-CM | POA: Insufficient documentation

## 2014-05-27 DIAGNOSIS — R531 Weakness: Secondary | ICD-10-CM | POA: Diagnosis present

## 2014-05-27 HISTORY — PX: LUMBAR LAMINECTOMY/DECOMPRESSION MICRODISCECTOMY: SHX5026

## 2014-05-27 LAB — COMPREHENSIVE METABOLIC PANEL (CC13)
ALK PHOS: 89 U/L (ref 40–150)
ALT: 19 U/L (ref 0–55)
AST: 20 U/L (ref 5–34)
Albumin: 3 g/dL — ABNORMAL LOW (ref 3.5–5.0)
Anion Gap: 16 mEq/L — ABNORMAL HIGH (ref 3–11)
BILIRUBIN TOTAL: 0.48 mg/dL (ref 0.20–1.20)
BUN: 47.2 mg/dL — AB (ref 7.0–26.0)
CO2: 22 mEq/L (ref 22–29)
Calcium: 9.9 mg/dL (ref 8.4–10.4)
Chloride: 99 mEq/L (ref 98–109)
Creatinine: 2.5 mg/dL — ABNORMAL HIGH (ref 0.7–1.3)
EGFR: 24 mL/min/{1.73_m2} — AB (ref >90)
Glucose: 108 mg/dl (ref 70–140)
Potassium: 4.1 mEq/L (ref 3.5–5.1)
Sodium: 137 mEq/L (ref 136–145)
Total Protein: 7.5 g/dL (ref 6.4–8.3)

## 2014-05-27 LAB — CBC WITH DIFFERENTIAL/PLATELET
BASO%: 0.6 % (ref 0.0–2.0)
BASOS ABS: 0.1 10*3/uL (ref 0.0–0.1)
EOS%: 0.8 % (ref 0.0–7.0)
Eosinophils Absolute: 0.1 10*3/uL (ref 0.0–0.5)
HCT: 36 % — ABNORMAL LOW (ref 38.4–49.9)
HGB: 11.5 g/dL — ABNORMAL LOW (ref 13.0–17.1)
LYMPH%: 5.9 % — ABNORMAL LOW (ref 14.0–49.0)
MCH: 29.8 pg (ref 27.2–33.4)
MCHC: 32.1 g/dL (ref 32.0–36.0)
MCV: 92.9 fL (ref 79.3–98.0)
MONO#: 0.7 10*3/uL (ref 0.1–0.9)
MONO%: 5.8 % (ref 0.0–14.0)
NEUT%: 86.9 % — AB (ref 39.0–75.0)
NEUTROS ABS: 10.8 10*3/uL — AB (ref 1.5–6.5)
Platelets: 360 10*3/uL (ref 140–400)
RBC: 3.87 10*6/uL — AB (ref 4.20–5.82)
RDW: 15.3 % — ABNORMAL HIGH (ref 11.0–14.6)
WBC: 12.4 10*3/uL — AB (ref 4.0–10.3)
lymph#: 0.7 10*3/uL — ABNORMAL LOW (ref 0.9–3.3)

## 2014-05-27 LAB — HEMOGLOBIN AND HEMATOCRIT, BLOOD
HEMATOCRIT: 29.4 % — AB (ref 39.0–52.0)
HEMOGLOBIN: 9.7 g/dL — AB (ref 13.0–17.0)

## 2014-05-27 LAB — ABO/RH: ABO/RH(D): AB POS

## 2014-05-27 LAB — PREPARE RBC (CROSSMATCH)

## 2014-05-27 SURGERY — LUMBAR LAMINECTOMY/DECOMPRESSION MICRODISCECTOMY 1 LEVEL
Anesthesia: General | Site: Back

## 2014-05-27 MED ORDER — LIDOCAINE-EPINEPHRINE 1 %-1:100000 IJ SOLN
INTRAMUSCULAR | Status: DC | PRN
  Administered 2014-05-27: 10 mL

## 2014-05-27 MED ORDER — HYDROMORPHONE HCL 1 MG/ML IJ SOLN: 0.2500 mg | INTRAMUSCULAR | Status: DC | PRN

## 2014-05-27 MED ORDER — FUROSEMIDE 80 MG PO TABS: 80.0000 mg | ORAL_TABLET | Freq: Every day | ORAL | Status: DC

## 2014-05-27 MED ORDER — CYCLOBENZAPRINE HCL 10 MG PO TABS
10.0000 mg | ORAL_TABLET | Freq: Three times a day (TID) | ORAL | Status: DC | PRN
  Administered 2014-05-28: 10 mg via ORAL
  Filled 2014-05-27: qty 1

## 2014-05-27 MED ORDER — BUPIVACAINE HCL (PF) 0.25 % IJ SOLN
INTRAMUSCULAR | Status: DC | PRN
  Administered 2014-05-27: 10 mL

## 2014-05-27 MED ORDER — TRIAMTERENE-HCTZ 75-50 MG PO TABS
1.0000 | ORAL_TABLET | Freq: Every day | ORAL | Status: DC
  Filled 2014-05-27: qty 1

## 2014-05-27 MED ORDER — FOLIC ACID 1 MG PO TABS
1.0000 mg | ORAL_TABLET | Freq: Every day | ORAL | Status: DC
  Administered 2014-05-28 – 2014-06-03 (×7): 1 mg via ORAL
  Filled 2014-05-27 (×7): qty 1

## 2014-05-27 MED ORDER — ACETAMINOPHEN 650 MG RE SUPP: 650.0000 mg | RECTAL | Status: DC | PRN

## 2014-05-27 MED ORDER — MIDAZOLAM HCL 2 MG/2ML IJ SOLN
INTRAMUSCULAR | Status: AC
  Filled 2014-05-27: qty 2

## 2014-05-27 MED ORDER — CEFAZOLIN SODIUM 1-5 GM-% IV SOLN
1.0000 g | Freq: Three times a day (TID) | INTRAVENOUS | Status: DC
  Filled 2014-05-27: qty 50

## 2014-05-27 MED ORDER — HEMOSTATIC AGENTS (NO CHARGE) OPTIME
TOPICAL | Status: DC | PRN
  Administered 2014-05-27: 1 via TOPICAL

## 2014-05-27 MED ORDER — DEXAMETHASONE SODIUM PHOSPHATE 10 MG/ML IJ SOLN
INTRAMUSCULAR | Status: DC | PRN
  Administered 2014-05-27: 10 mg via INTRAVENOUS

## 2014-05-27 MED ORDER — PHENYLEPHRINE HCL 10 MG/ML IJ SOLN
INTRAMUSCULAR | Status: DC | PRN
  Administered 2014-05-27 (×4): 80 ug via INTRAVENOUS
  Administered 2014-05-27: 40 ug via INTRAVENOUS

## 2014-05-27 MED ORDER — SODIUM CHLORIDE 0.9 % IJ SOLN
3.0000 mL | Freq: Two times a day (BID) | INTRAMUSCULAR | Status: DC
  Administered 2014-05-27 – 2014-06-02 (×8): 3 mL via INTRAVENOUS

## 2014-05-27 MED ORDER — SODIUM CHLORIDE 0.9 % IR SOLN
Status: DC | PRN
  Administered 2014-05-27: 18:00:00

## 2014-05-27 MED ORDER — NEOSTIGMINE METHYLSULFATE 10 MG/10ML IV SOLN
INTRAVENOUS | Status: DC | PRN
  Administered 2014-05-27: 3 mg via INTRAVENOUS

## 2014-05-27 MED ORDER — LACTATED RINGERS IV SOLN
INTRAVENOUS | Status: DC | PRN
  Administered 2014-05-27 (×2): via INTRAVENOUS

## 2014-05-27 MED ORDER — DEXTROSE 5 % IV SOLN
10.0000 mg | INTRAVENOUS | Status: DC | PRN
  Administered 2014-05-27: 40 ug/min via INTRAVENOUS

## 2014-05-27 MED ORDER — TAMSULOSIN HCL 0.4 MG PO CAPS
0.4000 mg | ORAL_CAPSULE | Freq: Two times a day (BID) | ORAL | Status: DC
  Administered 2014-05-27 – 2014-06-03 (×14): 0.4 mg via ORAL
  Filled 2014-05-27 (×14): qty 1

## 2014-05-27 MED ORDER — ROCURONIUM BROMIDE 100 MG/10ML IV SOLN
INTRAVENOUS | Status: DC | PRN
  Administered 2014-05-27: 50 mg via INTRAVENOUS

## 2014-05-27 MED ORDER — FENTANYL CITRATE 0.05 MG/ML IJ SOLN
INTRAMUSCULAR | Status: DC | PRN
  Administered 2014-05-27 (×5): 50 ug via INTRAVENOUS

## 2014-05-27 MED ORDER — ALBUMIN HUMAN 5 % IV SOLN
INTRAVENOUS | Status: DC | PRN
  Administered 2014-05-27: 20:00:00 via INTRAVENOUS

## 2014-05-27 MED ORDER — MENTHOL 3 MG MT LOZG: 1.0000 | LOZENGE | OROMUCOSAL | Status: DC | PRN

## 2014-05-27 MED ORDER — PROPOFOL 10 MG/ML IV BOLUS
INTRAVENOUS | Status: DC | PRN
  Administered 2014-05-27: 150 mg via INTRAVENOUS

## 2014-05-27 MED ORDER — POTASSIUM CHLORIDE CRYS ER 20 MEQ PO TBCR
20.0000 meq | EXTENDED_RELEASE_TABLET | Freq: Two times a day (BID) | ORAL | Status: DC
Start: 2014-05-27 — End: 2014-05-30
  Administered 2014-05-27 – 2014-05-30 (×6): 20 meq via ORAL
  Filled 2014-05-27 (×6): qty 1

## 2014-05-27 MED ORDER — PROPOFOL 10 MG/ML IV BOLUS
INTRAVENOUS | Status: AC
  Filled 2014-05-27: qty 20

## 2014-05-27 MED ORDER — SODIUM CHLORIDE 0.9 % IV SOLN
Freq: Once | INTRAVENOUS | Status: AC
  Administered 2014-05-28: 1000 mL via INTRAVENOUS

## 2014-05-27 MED ORDER — OXYCODONE-ACETAMINOPHEN 5-325 MG PO TABS
ORAL_TABLET | ORAL | Status: AC
  Filled 2014-05-27: qty 2

## 2014-05-27 MED ORDER — DEXAMETHASONE 4 MG PO TABS: 4.0000 mg | ORAL_TABLET | Freq: Four times a day (QID) | ORAL | Status: DC

## 2014-05-27 MED ORDER — ONDANSETRON HCL 4 MG/2ML IJ SOLN
INTRAMUSCULAR | Status: DC | PRN
  Administered 2014-05-27: 4 mg via INTRAVENOUS

## 2014-05-27 MED ORDER — LIDOCAINE HCL (CARDIAC) 20 MG/ML IV SOLN
INTRAVENOUS | Status: DC | PRN
  Administered 2014-05-27: 50 mg via INTRAVENOUS

## 2014-05-27 MED ORDER — OXYCODONE HCL 5 MG PO TABS: 5.0000 mg | ORAL_TABLET | Freq: Once | ORAL | Status: DC | PRN

## 2014-05-27 MED ORDER — SODIUM CHLORIDE 0.9 % IV SOLN: 250.0000 mL | INTRAVENOUS | Status: DC

## 2014-05-27 MED ORDER — NEOSTIGMINE METHYLSULFATE 10 MG/10ML IV SOLN
INTRAVENOUS | Status: AC
  Filled 2014-05-27: qty 1

## 2014-05-27 MED ORDER — PHENOL 1.4 % MT LIQD: 1.0000 | OROMUCOSAL | Status: DC | PRN

## 2014-05-27 MED ORDER — THROMBIN 5000 UNITS EX SOLR
CUTANEOUS | Status: DC | PRN
  Administered 2014-05-27 (×2): 5000 [IU] via TOPICAL

## 2014-05-27 MED ORDER — CEFAZOLIN SODIUM 1-5 GM-% IV SOLN
INTRAVENOUS | Status: AC
  Administered 2014-05-27: 1 g via INTRAVENOUS
  Filled 2014-05-27: qty 50

## 2014-05-27 MED ORDER — HYDROMORPHONE HCL 1 MG/ML IJ SOLN: 0.5000 mg | INTRAMUSCULAR | Status: DC | PRN

## 2014-05-27 MED ORDER — ONDANSETRON HCL 4 MG/2ML IJ SOLN: 4.0000 mg | INTRAMUSCULAR | Status: DC | PRN

## 2014-05-27 MED ORDER — SENNA 8.6 MG PO TABS
1.0000 | ORAL_TABLET | Freq: Two times a day (BID) | ORAL | Status: DC
  Administered 2014-05-27 – 2014-06-02 (×7): 8.6 mg via ORAL
  Filled 2014-05-27 (×9): qty 1

## 2014-05-27 MED ORDER — GLYCOPYRROLATE 0.2 MG/ML IJ SOLN
INTRAMUSCULAR | Status: DC | PRN
  Administered 2014-05-27: 0.4 mg via INTRAVENOUS

## 2014-05-27 MED ORDER — FENTANYL CITRATE 0.05 MG/ML IJ SOLN
INTRAMUSCULAR | Status: AC
  Filled 2014-05-27: qty 5

## 2014-05-27 MED ORDER — OXYCODONE-ACETAMINOPHEN 10-325 MG PO TABS: 1.0000 | ORAL_TABLET | ORAL | Status: DC | PRN

## 2014-05-27 MED ORDER — DOCUSATE SODIUM 100 MG PO CAPS
100.0000 mg | ORAL_CAPSULE | Freq: Two times a day (BID) | ORAL | Status: DC
  Administered 2014-05-27 – 2014-06-02 (×10): 100 mg via ORAL
  Filled 2014-05-27 (×11): qty 1

## 2014-05-27 MED ORDER — OXYCODONE HCL 5 MG/5ML PO SOLN: 5.0000 mg | Freq: Once | ORAL | Status: DC | PRN

## 2014-05-27 MED ORDER — ACETAMINOPHEN 325 MG PO TABS: 650.0000 mg | ORAL_TABLET | ORAL | Status: DC | PRN

## 2014-05-27 MED ORDER — 0.9 % SODIUM CHLORIDE (POUR BTL) OPTIME
TOPICAL | Status: DC | PRN
  Administered 2014-05-27: 1000 mL

## 2014-05-27 MED ORDER — GUAIFENESIN ER 600 MG PO TB12
1200.0000 mg | ORAL_TABLET | Freq: Two times a day (BID) | ORAL | Status: DC | PRN
  Administered 2014-05-30 – 2014-06-02 (×6): 1200 mg via ORAL
  Filled 2014-05-27 (×7): qty 2

## 2014-05-27 MED ORDER — THROMBIN 5000 UNITS EX SOLR
CUTANEOUS | Status: DC | PRN
  Administered 2014-05-27: 19:00:00 via TOPICAL
  Administered 2014-05-27: 5 mL via TOPICAL

## 2014-05-27 MED ORDER — OXYCODONE HCL 5 MG PO TABS
5.0000 mg | ORAL_TABLET | ORAL | Status: DC | PRN
Start: 2014-05-27 — End: 2014-06-03
  Administered 2014-05-28 (×2): 5 mg via ORAL
  Filled 2014-05-27 (×4): qty 1

## 2014-05-27 MED ORDER — DEXAMETHASONE 4 MG PO TABS
4.0000 mg | ORAL_TABLET | Freq: Four times a day (QID) | ORAL | Status: DC
  Administered 2014-05-27: 4 mg via ORAL
  Filled 2014-05-27: qty 1

## 2014-05-27 MED ORDER — OXYCODONE HCL 5 MG PO TABS
20.0000 mg | ORAL_TABLET | Freq: Four times a day (QID) | ORAL | Status: DC
  Administered 2014-05-27: 20 mg via ORAL
  Filled 2014-05-27 (×5): qty 4

## 2014-05-27 MED ORDER — OXYCODONE-ACETAMINOPHEN 5-325 MG PO TABS
1.0000 | ORAL_TABLET | ORAL | Status: DC | PRN
  Administered 2014-06-03 (×2): 1 via ORAL
  Filled 2014-05-27 (×3): qty 1

## 2014-05-27 MED ORDER — SODIUM CHLORIDE 0.9 % IJ SOLN: 3.0000 mL | INTRAMUSCULAR | Status: DC | PRN

## 2014-05-27 MED ORDER — ONDANSETRON HCL 4 MG/2ML IJ SOLN: 4.0000 mg | Freq: Once | INTRAMUSCULAR | Status: DC | PRN

## 2014-05-27 MED ORDER — GLYCOPYRROLATE 0.2 MG/ML IJ SOLN
INTRAMUSCULAR | Status: AC
  Filled 2014-05-27: qty 3

## 2014-05-27 MED ORDER — CEFAZOLIN SODIUM 1-5 GM-% IV SOLN
1.0000 g | Freq: Two times a day (BID) | INTRAVENOUS | Status: DC
  Administered 2014-05-28: 1 g via INTRAVENOUS
  Filled 2014-05-27 (×2): qty 50

## 2014-05-27 SURGICAL SUPPLY — 61 items
ADH SKN CLS APL DERMABOND .7 (GAUZE/BANDAGES/DRESSINGS) ×1
APL SKNCLS STERI-STRIP NONHPOA (GAUZE/BANDAGES/DRESSINGS) ×1
BAG DECANTER FOR FLEXI CONT (MISCELLANEOUS) ×3 IMPLANT
BENZOIN TINCTURE PRP APPL 2/3 (GAUZE/BANDAGES/DRESSINGS) ×3 IMPLANT
BLADE CLIPPER SURG (BLADE) IMPLANT
BLADE SURG 11 STRL SS (BLADE) ×3 IMPLANT
BRUSH SCRUB EZ PLAIN DRY (MISCELLANEOUS) ×3 IMPLANT
BUR MATCHSTICK NEURO 3.0 LAGG (BURR) ×3 IMPLANT
BUR PRECISION FLUTE 6.0 (BURR) ×3 IMPLANT
CANISTER SUCT 3000ML (MISCELLANEOUS) ×3 IMPLANT
CLOSURE WOUND 1/2 X4 (GAUZE/BANDAGES/DRESSINGS) ×1
CONT SPEC 4OZ CLIKSEAL STRL BL (MISCELLANEOUS) ×3 IMPLANT
DECANTER SPIKE VIAL GLASS SM (MISCELLANEOUS) ×3 IMPLANT
DERMABOND ADVANCED (GAUZE/BANDAGES/DRESSINGS) ×2
DERMABOND ADVANCED .7 DNX12 (GAUZE/BANDAGES/DRESSINGS) IMPLANT
DRAPE LAPAROTOMY 100X72X124 (DRAPES) ×3 IMPLANT
DRAPE MICROSCOPE LEICA (MISCELLANEOUS) ×3 IMPLANT
DRAPE POUCH INSTRU U-SHP 10X18 (DRAPES) ×3 IMPLANT
DRAPE PROXIMA HALF (DRAPES) IMPLANT
DRAPE SURG 17X23 STRL (DRAPES) ×3 IMPLANT
DRSG OPSITE 4X5.5 SM (GAUZE/BANDAGES/DRESSINGS) ×3 IMPLANT
DRSG OPSITE POSTOP 4X6 (GAUZE/BANDAGES/DRESSINGS) ×2 IMPLANT
DURAPREP 26ML APPLICATOR (WOUND CARE) ×3 IMPLANT
ELECT BLADE 4.0 EZ CLEAN MEGAD (MISCELLANEOUS) ×3
ELECT REM PT RETURN 9FT ADLT (ELECTROSURGICAL) ×3
ELECTRODE BLDE 4.0 EZ CLN MEGD (MISCELLANEOUS) IMPLANT
ELECTRODE REM PT RTRN 9FT ADLT (ELECTROSURGICAL) ×1 IMPLANT
GAUZE SPONGE 4X4 12PLY STRL (GAUZE/BANDAGES/DRESSINGS) ×3 IMPLANT
GAUZE SPONGE 4X4 16PLY XRAY LF (GAUZE/BANDAGES/DRESSINGS) IMPLANT
GLOVE BIO SURGEON STRL SZ8 (GLOVE) ×3 IMPLANT
GLOVE EXAM NITRILE LRG STRL (GLOVE) IMPLANT
GLOVE EXAM NITRILE MD LF STRL (GLOVE) IMPLANT
GLOVE EXAM NITRILE XL STR (GLOVE) IMPLANT
GLOVE EXAM NITRILE XS STR PU (GLOVE) IMPLANT
GLOVE INDICATOR 8.5 STRL (GLOVE) ×3 IMPLANT
GOWN STRL REUS W/ TWL LRG LVL3 (GOWN DISPOSABLE) ×1 IMPLANT
GOWN STRL REUS W/ TWL XL LVL3 (GOWN DISPOSABLE) ×2 IMPLANT
GOWN STRL REUS W/TWL 2XL LVL3 (GOWN DISPOSABLE) IMPLANT
GOWN STRL REUS W/TWL LRG LVL3 (GOWN DISPOSABLE) ×3
GOWN STRL REUS W/TWL XL LVL3 (GOWN DISPOSABLE) ×6
HEMOSTAT POWDER KIT SURGIFOAM (HEMOSTASIS) ×2 IMPLANT
KIT BASIN OR (CUSTOM PROCEDURE TRAY) ×3 IMPLANT
KIT ROOM TURNOVER OR (KITS) ×3 IMPLANT
LIQUID BAND (GAUZE/BANDAGES/DRESSINGS) ×3 IMPLANT
NDL SPNL 22GX3.5 QUINCKE BK (NEEDLE) ×1 IMPLANT
NEEDLE HYPO 22GX1.5 SAFETY (NEEDLE) ×3 IMPLANT
NEEDLE SPNL 22GX3.5 QUINCKE BK (NEEDLE) ×3 IMPLANT
NS IRRIG 1000ML POUR BTL (IV SOLUTION) ×3 IMPLANT
PACK LAMINECTOMY NEURO (CUSTOM PROCEDURE TRAY) ×3 IMPLANT
RUBBERBAND STERILE (MISCELLANEOUS) ×6 IMPLANT
SPONGE SURGIFOAM ABS GEL SZ50 (HEMOSTASIS) ×3 IMPLANT
STRIP CLOSURE SKIN 1/2X4 (GAUZE/BANDAGES/DRESSINGS) ×2 IMPLANT
SUT VIC AB 0 CT1 18XCR BRD8 (SUTURE) ×1 IMPLANT
SUT VIC AB 0 CT1 8-18 (SUTURE) ×3
SUT VIC AB 2-0 CT1 18 (SUTURE) ×3 IMPLANT
SUT VICRYL 4-0 PS2 18IN ABS (SUTURE) ×3 IMPLANT
SYR 20ML ECCENTRIC (SYRINGE) ×3 IMPLANT
TAPE STRIPS DRAPE STRL (GAUZE/BANDAGES/DRESSINGS) ×2 IMPLANT
TOWEL OR 17X24 6PK STRL BLUE (TOWEL DISPOSABLE) ×3 IMPLANT
TOWEL OR 17X26 10 PK STRL BLUE (TOWEL DISPOSABLE) ×3 IMPLANT
WATER STERILE IRR 1000ML POUR (IV SOLUTION) ×3 IMPLANT

## 2014-05-27 NOTE — Consult Note (Signed)
Reason for Consult: Back and leg pain and urinary retention Referring Physician: Oncology Dr. Inda Merlin and Dr. Ledon Snare  Logan Russell is an 79 y.o. male.  HPI: Patient is a 79 year gentleman with a long-standing history of non-small cell lung cancer adenocarcinoma stage IIIB. Patient's at 3-4 weeks for urinary retention pain going down from his back posterior aspect of his legs into his feet with numbness and tingling in the same stability shoe. He is not noting significant weakness although he has been progressively having more difficulty and living secondary to pain. He underwent an MRI scan of his lumbar spine which showed what appeared to be extensive cancerous invasion of his L5 vertebral body and posterior elements. With severe dorsal compression of his thecal sac at that level. Due to patient's progression of clinical syndrome MRI findings and failure of treatment I recommended a posterior decompressive laminectomy. I've extensively gone over the risks and benefits of the operation as well as perioperative course expectations of outcome and alternatives of surgery and he understands and agrees to proceed forward.  Past Medical History  Diagnosis Date  . Hypertension   . History of radiation therapy 05/02/12-06/19/12    left lung mediastinal lymph nodes 66Gy/8fx  . Lung cancer 04/12/2012    No past surgical history on file.  Family History  Problem Relation Age of Onset  . Heart disease Father     Social History:  reports that he quit smoking about 2 years ago. He does not have any smokeless tobacco history on file. He reports that he does not drink alcohol. His drug history is not on file.  Allergies: No Known Allergies  Medications: I have reviewed the patient's current medications.  Results for orders placed or performed in visit on 05/27/14 (from the past 48 hour(s))  CBC with Differential     Status: Abnormal   Collection Time: 05/27/14  1:40 PM  Result Value Ref Range   WBC 12.4 (H) 4.0 - 10.3 10e3/uL   NEUT# 10.8 (H) 1.5 - 6.5 10e3/uL   HGB 11.5 (L) 13.0 - 17.1 g/dL   HCT 36.0 (L) 38.4 - 49.9 %   Platelets 360 140 - 400 10e3/uL   MCV 92.9 79.3 - 98.0 fL   MCH 29.8 27.2 - 33.4 pg   MCHC 32.1 32.0 - 36.0 g/dL   RBC 3.87 (L) 4.20 - 5.82 10e6/uL   RDW 15.3 (H) 11.0 - 14.6 %   lymph# 0.7 (L) 0.9 - 3.3 10e3/uL   MONO# 0.7 0.1 - 0.9 10e3/uL   Eosinophils Absolute 0.1 0.0 - 0.5 10e3/uL   Basophils Absolute 0.1 0.0 - 0.1 10e3/uL   NEUT% 86.9 (H) 39.0 - 75.0 %   LYMPH% 5.9 (L) 14.0 - 49.0 %   MONO% 5.8 0.0 - 14.0 %   EOS% 0.8 0.0 - 7.0 %   BASO% 0.6 0.0 - 2.0 %  Comprehensive metabolic panel (Cmet) - [facility]     Status: Abnormal   Collection Time: 05/27/14  1:41 PM  Result Value Ref Range   Sodium 137 136 - 145 mEq/L   Potassium 4.1 3.5 - 5.1 mEq/L   Chloride 99 98 - 109 mEq/L   CO2 22 22 - 29 mEq/L   Glucose 108 70 - 140 mg/dl   BUN 47.2 (H) 7.0 - 26.0 mg/dL   Creatinine 2.5 (H) 0.7 - 1.3 mg/dL   Total Bilirubin 0.48 0.20 - 1.20 mg/dL   Alkaline Phosphatase 89 40 - 150 U/L  AST 20 5 - 34 U/L   ALT 19 0 - 55 U/L   Total Protein 7.5 6.4 - 8.3 g/dL   Albumin 3.0 (L) 3.5 - 5.0 g/dL   Calcium 9.9 8.4 - 10.4 mg/dL   Anion Gap 16 (H) 3 - 11 mEq/L   EGFR 24 (L) >90 ml/min/1.73 m2    Comment: eGFR is calculated using the CKD-EPI Creatinine Equation (2009)    No results found.  Review of Systems  Constitutional: Negative.   HENT: Negative.   Gastrointestinal: Positive for constipation.  Genitourinary: Positive for urgency and frequency.  Musculoskeletal: Positive for myalgias and back pain.  Skin: Negative.   Neurological: Positive for tingling and sensory change.  Psychiatric/Behavioral: Negative.    There were no vitals taken for this visit. Physical Exam  Constitutional: He is oriented to person, place, and time. He appears well-developed and well-nourished.  HENT:  Head: Normocephalic and atraumatic.  Eyes: Pupils are equal, round, and  reactive to light.  Neck: Normal range of motion.  Cardiovascular: Normal rate.   Respiratory: Effort normal.  GI: Soft.  Neurological: He is alert and oriented to person, place, and time. GCS eye subscore is 4. GCS verbal subscore is 5. GCS motor subscore is 6.  Strength is 5 out of 5 in his iliopsoas, quads, hamstrings, gastrocs, anterior tibialis and EHL are weak on the left 4 out of 5 sensation is decreased posterior aspects of both legs  Skin: Skin is warm and dry.    Assessment/Plan: 79 year old gentleman presents for posterior lumbar decompressive laminectomy at L5  Logan Russell P 05/27/2014, 5:44 PM

## 2014-05-27 NOTE — Progress Notes (Deleted)
Called for bed control for patient being admitted per Awilda Metro, PA and Dr. Tammi Klippel. Spoke with Hilliard Clark in bed admitting, pt   Called Sam with Dr. Tammi Klippel, pt will be taken down stairs to radiation to start simulation while waiting for Dr. Tammi Klippel to arrive. Informed Sam, RN of pt room number.

## 2014-05-27 NOTE — Telephone Encounter (Signed)
Order for light weight w/c sent via EPIC to [facility]

## 2014-05-27 NOTE — Anesthesia Preprocedure Evaluation (Signed)
Anesthesia Evaluation  Patient identified by MRN, date of birth, ID band Patient awake    Reviewed: Allergy & Precautions, H&P , NPO status , Patient's Chart, lab work & pertinent test results  Airway Mallampati: I  TM Distance: >3 FB Neck ROM: Full    Dental  (+) Upper Dentures, Lower Dentures, Dental Advisory Given   Pulmonary former smoker,  breath sounds clear to auscultation        Cardiovascular hypertension, Pt. on medications Rhythm:Regular Rate:Normal     Neuro/Psych    GI/Hepatic   Endo/Other    Renal/GU Renal disease     Musculoskeletal   Abdominal   Peds  Hematology   Anesthesia Other Findings   Reproductive/Obstetrics                             Anesthesia Physical Anesthesia Plan  ASA: IV and emergent  Anesthesia Plan: General   Post-op Pain Management:    Induction: Intravenous  Airway Management Planned: Oral ETT  Additional Equipment:   Intra-op Plan:   Post-operative Plan: Extubation in OR  Informed Consent: I have reviewed the patients History and Physical, chart, labs and discussed the procedure including the risks, benefits and alternatives for the proposed anesthesia with the patient or authorized representative who has indicated his/her understanding and acceptance.   Dental advisory given  Plan Discussed with: CRNA, Anesthesiologist and Surgeon  Anesthesia Plan Comments:         Anesthesia Quick Evaluation

## 2014-05-27 NOTE — Progress Notes (Addendum)
.      [place] [phone]   [phone]  OFFICE PROGRESS NOTE  [name], MD [place] [address]  DIAGNOSIS: Stage IIIB (T2a., N3, M0) non-small cell lung cancer consistent with invasive adenocarcinoma with negative ALK gene translocation and negative EGFR mutation diagnosed in October of 2013   PRIOR THERAPY:  1) Concurrent chemoradiation with weekly carboplatin for AUC of 2 and paclitaxel 45 mg/M2, last dose was given 06/11/2012 with partial response.  2) Consolidation chemotherapy with carboplatin for AUC of 5 and Alimta 500 mg/M2 every 3 weeks. Status post 3 cycles last dose was given 09/12/2012 with stable disease.  3) Systemic chemotherapy again with carboplatin for AUC of 5 and Alimta 500 mg/M2 every 3 weeks. First cycle expected on 11/13/2013. Status post 3 cycles, discontinued secondary to disease progression. Carboplatin was discontinued starting from cycle #2 secondary to hypersensitivity reaction.  CURRENT THERAPY: Immunotherapy with single agent Nivolumab 3 MG/TG every 2 weeks status post 8 cycles.   CHEMOTHERAPY INTENT: Palliative  CURRENT # OF CHEMOTHERAPY CYCLES: 8  CURRENT ANTIEMETICS: N/A  CURRENT SMOKING STATUS: Former Smoker  ORAL CHEMOTHERAPY AND CONSENT: None  CURRENT BISPHOSPHONATES USE: None  PAIN MANAGEMENT: 0/10  NARCOTICS INDUCED CONSTIPATION: None  LIVING WILL AND CODE STATUS: no code Blue  INTERVAL HISTORY: Logan Russell 79 y.o. male returns to the clinic today for follow-up visit accompanied by his wife and another family member. He is currently on treatment with Nivolumab status post 8 cycles and is tolerating his treatment fairly well with no significant adverse effects except for fatigue. He was recently diagnosed with urinary retention causing bilateral hydronephrosis with elevation of his renal function. He was seen by Dr. Janice Norrie and currently has Foley's catheter in place.  He denied having any nausea or vomiting, no fever or chills, no diarrhea or skin rash. Although he does report one episode of vomiting this morning. He had some fruit cup around 10 AM. The patient denied having any night sweats. He has reported some weight loss due to significantly decreased by mouth intake. He denied having any significant chest pain but continues to have shortness breath with exertion with no cough or hemoptysis. He presents to start cycle #9 today. He had a restaging CT scan of his chest to reevaluate his disease and presents to discuss results. Of note, the patient states that he was having increasing difficulty walking due to increased pain and then one day was "unable to walk". He was evaluated by his primary care physician, Dr. Claris Gower who ordered a MRI of the lumbar spine without contrast that was performed on 05/24/2014. The results revealed severe metastatic tumor infiltration of the L5 level as well as the left L4. S1 levels affected to a lesser extent. There is subsequent obliteration of the spinal canal and both neural foramina at L5. Epidural tumor at L4 level on the left excess rating under lying degenerative moderate to severe spinal and left L4 foraminal stenosis. Metastatic prevertebral and retroperitoneal lymphadenopathy. He is been taking oxycodone 10 mg every 4-6 hours for pain. Patient reports he has not had a bowel movement in approximately 5 days but also reports significantly decreased by mouth intake. As stated above he currently has an indwelling Foley catheter.  MEDICAL HISTORY: Past Medical History  Diagnosis Date  . Hypertension   . History of radiation therapy 05/02/12-06/19/12    left lung mediastinal lymph nodes 66Gy/75f  . Lung cancer 04/12/2012  ALLERGIES:  has No Known Allergies.  MEDICATIONS:  Current Outpatient Prescriptions  Medication Sig Dispense Refill  . guaiFENesin (MUCINEX) 600 MG 12 hr tablet Take 1,200 mg by mouth 2 (two) times  daily.    Marland Kitchen oxyCODONE-acetaminophen (PERCOCET) 10-325 MG per tablet Take 1 tablet by mouth every 4 (four) hours as needed for pain.    . potassium chloride SA (K-DUR,KLOR-CON) 20 MEQ tablet Take 1 tablet (20 mEq total) by mouth 2 (two) times daily. Take 42mq daily x 7 days 20 tablet 0  . triamterene-hydrochlorothiazide (MAXZIDE) 75-50 MG per tablet Take 1 tablet by mouth daily.    .Marland Kitchendexamethasone (DECADRON) 4 MG tablet Take 1 tablet (4 mg total) by mouth 4 (four) times daily. Take with food 50 tablet 0  . folic acid (FOLVITE) 1 MG tablet Take 1 tablet (1 mg total) by mouth daily. (Patient not taking: Reported on 05/27/2014) 30 tablet 4   No current facility-administered medications for this visit.    REVIEW OF SYSTEMS:  Constitutional: positive for fatigue Eyes: negative Ears, nose, mouth, throat, and face: negative Respiratory: positive for dyspnea on exertion Cardiovascular: negative Gastrointestinal: negative Genitourinary:positive for patient has a foley atheter in place and bilateral hydronephrosis Integument/breast: negative Hematologic/lymphatic: negative Musculoskeletal:positive for back pain and muscle weakness Neurological: positive for gait problems, paresthesia and weakness Behavioral/Psych: negative Endocrine: negative Allergic/Immunologic: negative   PHYSICAL EXAMINATION: General appearance: alert, cooperative and no distress Head: Normocephalic, without obvious abnormality, atraumatic Neck: no adenopathy Lymph nodes: Cervical, supraclavicular, and axillary nodes normal. Resp: clear to auscultation bilaterally Cardio: regular rate and rhythm, S1, S2 normal, no murmur, click, rub or gallop GI: soft, non-tender; bowel sounds normal; no masses,  no organomegaly Extremities: extremities normal, atraumatic, no cyanosis or edema Patient able to lift both lower extremities independently without assistance, strength 3/5 bilaterally  ECOG PERFORMANCE STATUS: 1 - Symptomatic  but completely ambulatory  Blood pressure 121/71, pulse 116, temperature 98 F (36.7 C), temperature source Oral, resp. rate 18, height _0  (1.651 m), weight 175 lb 9.6 oz (79.652 kg), SpO2 99 %.  LABORATORY DATA: Lab Results  Component Value Date   WBC 12.4* 05/27/2014   HGB 11.5* 05/27/2014   HCT 36.0* 05/27/2014   MCV 92.9 05/27/2014   PLT 360 05/27/2014      Chemistry      Component Value Date/Time   NA 137 05/27/2014 1341   K 4.1 05/27/2014 1341   CL 106 10/08/2012 1028   CO2 22 05/27/2014 1341   BUN 47.2* 05/27/2014 1341   CREATININE 2.5* 05/27/2014 1341      Component Value Date/Time   CALCIUM 9.9 05/27/2014 1341   ALKPHOS 89 05/27/2014 1341   AST 20 05/27/2014 1341   ALT 19 05/27/2014 1341   BILITOT 0.48 05/27/2014 1341       RADIOGRAPHIC STUDIES: Ct Chest Wo Contrast  05/21/2014   CLINICAL DATA:  79year old male with history of lung cancer status post chemotherapy now complete. Ongoing immunotherapy. Cough and weakness.  EXAM: CT CHEST WITHOUT CONTRAST  TECHNIQUE: Multidetector CT imaging of the chest was performed following the standard protocol without IV contrast.  COMPARISON:  CT of the chest, abdomen and pelvis 03/07/2014.  FINDINGS: Mediastinum: Again noted is bulky nodal tissue in the right perihilar region, which encases and mildly narrows the bronchus intermedius, and encases the right lower lobe bronchus and right middle lobe bronchus (which is severely narrowed and nearly completely occluded), as well as the right upper lobe bronchus. Subcarinal adenopathy  is similar to the prior study measuring approximately 1.4 cm in short axis. Heart size is normal. Small amount of pericardial fluid and/or thickening, unlikely to be of hemodynamic significance at this time. No associated pericardial calcification. There is atherosclerosis of the thoracic aorta, the great vessels of the mediastinum and the coronary arteries, including calcified atherosclerotic plaque in  the left main, left anterior descending, left circumflex and right coronary arteries. Esophagus is unremarkable in appearance.  Lungs/Pleura: Chronic areas of postradiation mass-like fibrosis are again noted in the left lower lobe and perihilar aspect of the right lung. Previously noted nodule in the periphery of the right lower lobe appears slightly smaller than the prior examination measuring 6 x 5 mm on today's study (previously 9 x 7 mm). No other definite new suspicious appearing pulmonary nodules or masses are noted. No acute consolidative airspace disease. No pleural effusions.  Upper Abdomen: Calcified gallstones in the neck of the gallbladder. No current findings to suggest acute cholecystitis at this time. 12 mm low attenuation lesion in segment 2 of the liver is incompletely characterized, but similar prior studies, favored to represent a cyst. Visualized portions of the appendix are normal. Atherosclerosis in the thoracic aorta. Incompletely visualized low-attenuation lesion in the upper pole of the left kidney is similar to prior studies, favored to represent a cyst (although technically incompletely characterized on today's examination).  Musculoskeletal: There are no aggressive appearing lytic or blastic lesions noted in the visualized portions of the skeleton.  IMPRESSION: 1. Today's examination demonstrates stable findings in the thorax, as detailed above. No definite signs of progression of disease or new metastatic disease identified. 2. Atherosclerosis, including left main and 3 vessel coronary artery disease. 3. Additional incidental findings, as above.   Electronically Signed   By: Vinnie Langton M.D.   On: 05/21/2014 13:05   Mr Lumbar Spine Wo Contrast  05/25/2014   ADDENDUM REPORT: 05/25/2014 17:01  ADDENDUM: Additional message was left on the after hours voice mail regarding this exam at 1100 hr today. As of the time of this addendum, no call back was received.  This report will be  called to the ordering clinician or representative when the office opens on Monday 05/26/2014 by the Radiologist Assistant, and communication documented in the PACS or zVision Dashboard.   Electronically Signed   By: Lars Pinks M.D.   On: 05/25/2014 17:01   05/24/2014   ADDENDUM REPORT: 05/24/2014 18:37  ADDENDUM: Attempts were made to reach Dr. Claris Gower or the provider on call beginning at 1726 hrs. At the time of this addendum, I have received no return call. Additional attempts will be made to call this report on 05/25/2014.   Electronically Signed   By: Lars Pinks M.D.   On: 05/24/2014 18:37   05/25/2014   CLINICAL DATA:  79 year old male with pain in numbness from the waist to the feet, now unable to walk. Lung Cancer diagnosed 2 years ago. Subsequent encounter.  EXAM: MRI LUMBAR SPINE WITHOUT CONTRAST  TECHNIQUE: Multiplanar, multisequence MR imaging of the lumbar spine was performed. No intravenous contrast was administered.  COMPARISON:  CT Abdomen and Pelvis 03/07/2014 and earlier, including PET-CT 04/23/2013.  FINDINGS: Loss of normal bone marrow signal in association with extraosseous extension of tumor throughout the L5 level. The vertebral body and posterior elements appear diffusely infiltrated. There is associated bulky epidural, bilateral neural foraminal, and paraspinal soft tissue tumor at this level obliterating the spinal canal throughout the L5 vertebral level, and both  L5 neural foramina.  There is also abnormal marrow signal throughout the lower L4 vertebral body. There is tumor replacement of the left L4 pedicle, with epidural tumor associated (series 6, image 20). The left L4 neural foramen is severely affected. These changes contribute to underlying moderate to severe degenerative L4 level spinal stenosis.  There is abnormal marrow signal in the S1 posterior elements and to a lesser extent right sacral ala. The sacral spinal canal and S1 neural foramina are not affected at this time.   There is prevertebral lymphadenopathy, in association with more generalized retroperitoneal lymphadenopathy in the abdomen.  There is fluid in the L4-L5 and L5-S1 disc spaces, and there is abnormal signal in the medial psoas muscles (mostly at the L4 vertebral level), but the appearance is not suggestive of spinal infection.  There is superimposed degenerative lumbar spinal stenosis which is multifactorial, and moderate at L3-L4.  Visualized lower thoracic spinal cord is normal with conus medularis at T12.  IMPRESSION: 1. Severe metastatic tumor infiltration of the L5 level. Left L4 and superior S1 levels affected to a lesser extent. 2. Subsequent obliteration of the spinal canal and both neural foramina at L5. Epidural tumor at the L4 level on the left exacerbating underlying degenerative moderate to severe spinal and left L4 foraminal stenosis. 3. Metastatic prevertebral and retroperitoneal lymphadenopathy.  Electronically Signed: By: Lars Pinks M.D. On: 05/24/2014 17:29   ASSESSMENT AND PLAN: this is a very pleasant 79 years old white male with history of stage IIIB non-small cell lung cancer status post concurrent chemoradiation followed by consolidation chemotherapy. He had evidence for disease recurrence and the patient was started on systemic chemotherapy with carboplatin and Alimta discontinued secondary to disease progression. He is currently undergoing immunotherapy with single agent Nivolumab status post 8 cycles. He is tolerating his treatment fairly well with no significant adverse effects. The patient does not desire further treatment with immunotherapy at this time. Patient was discussed with and also seen by Dr. Julien Nordmann. His CT of the chest revealed stable disease however the MRI of his back reveals metastatic disease into the spine. Patient was reviewed with Dr. Tammi Klippel of radiation oncology as well as with Dr. Saintclair Halsted of neurosurgery. It was decided that he would go directly to the operating room  for possible debridement/removal of tumor. He may also require some radiation therapy to this area. Prescription for dexamethasone 4 mg by mouth every 6 hours was sent to his pharmacy of record via E scribed for when he is discharged from the hospital. He will require dexamethasone well he isn't inpatient. He will be admitted by one of the hospitalist, initially a cone although may be transferred to [facility] for radiation therapy if this is indicated during this hospitalization. Immunotherapy with Nivolumab has been discontinued due to disease progression within the spine. We'll plan to see him in follow-up in one month after he has recovered from surgery and completed radiation therapy if this is found to be indicated.  He was advised to call immediately if he has any concerning symptoms in the interval. The patient voices understanding of current disease status and treatment options and is in agreement with the current care plan.  All questions were answered. The patient knows to call the clinic with any problems, questions or concerns. We can certainly see the patient much sooner if necessary.  Carlton Adam, PA-C 05/27/2014  ADDENDUM: Hematology/Oncology Attending: I had a face to face encounter with the patient today. I recommended his care  plan. This is a very pleasant 79 years old white male with history of stage IIIB non-small cell lung cancer who was undergoing immunotherapy with Nivolumab status post 8 cycles. He tolerated his previous treatment fairly well and he has a stable disease in his chest. The patient has been complaining of urinary retention as well as weakness in his lower extremities. He was evaluated by urology and has a Foley catheter placed. A recent MRI of the lumbar spine performed on 05/25/2014 showed severe metastatic tumor infiltration of the L5 level. There was also left L4 and superior S1 level affected to a lesser extent. There was subsequent obliteration of the  spinal canal and both neural foramina at L5 with epidural tumor at the L4 level on the left exacerbating underlying degenerative moderate to severe spinal and left L4 foraminal stenosis. The patient was seen in the clinic today. We'll consulted with Dr. Tammi Klippel, radiation oncology as well as Dr. Saintclair Halsted, neurosurgeon regarding his condition. Dr. Saintclair Halsted requested transferring the patient to [facility] for consideration of surgical intervention and resection of his tumor. His immunotherapy will be on hold for now as the patient also requested a break from treatment. We will arrange a follow-up appointment for this patient in few weeks for reevaluation of his condition.  Disclaimer: This note was dictated with voice recognition software. Similar sounding words can inadvertently be transcribed and may not be corrected upon review. Eilleen Kempf., MD 05/27/2014

## 2014-05-27 NOTE — Patient Instructions (Signed)
Your immunotherapy is being discontinued due to the disease progression in your spine You are being taken directly to surgery to remove some of the tumor burden in your spine

## 2014-05-27 NOTE — Transfer of Care (Signed)
Immediate Anesthesia Transfer of Care Note  Patient: Logan Russell  Procedure(s) Performed: Procedure(s) with comments: L4-5,L5-S1 decompressive laminectomy (N/A) - L4-5,L5-S1 decompressive laminectomy  Patient Location: PACU  Anesthesia Type:General  Level of Consciousness: awake  Airway & Oxygen Therapy: Patient Spontanous Breathing and Patient connected to face mask oxygen  Post-op Assessment: Report given to PACU RN, Post -op Vital signs reviewed and stable, Patient moving all extremities and Patient moving all extremities X 4  Post vital signs: Reviewed and stable  Complications: No apparent anesthesia complications

## 2014-05-27 NOTE — Progress Notes (Signed)
Patient is a directed admission from [facility]. Family at bedside. Safety precautions reviewed with patient. Patient noted with indwelling catheter prior to admission per Dr. Janice Norrie (reported by wife). Patient is NPO for procedure. Family is aware. Patient left for OR with family at side.  Ave Filter, RN

## 2014-05-27 NOTE — Op Note (Signed)
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  EBL: 1 L   history of present illness: Patient is a 79 year old gentleman with known stage IIIB non-small cell lung cancer presents with 3-4 weeks of urinary retention and weakness in his left foot difficulty walking and pain in his back and legs. Workup revealed severe thecal sac compression resect tumor to his lumbar spine at L5 was severe stenosis from L4-S1. Due to patient's progression of clinical syndrome and imaging findings have recommended decompressive laminectomy extensively went over the risks and benefits of the operation with the patient as well as perioperative course expectations of outcome and alternatives of surgery and he understands and agrees to proceed forward.  Operative procedure: Patient brought into the or was induced under general anesthesia positioned prone the Wilson frame his back was prepped and draped in routine sterile fashion after infiltration of 10 mL lidocaine with epi a midline incision was made and Bovie light cautery was used to gas up since history and subperiosteal dissection carried lamina of was blue the L5 interoperative x-ray identified the L4-5 disc space and so the exposure was carried out to expose the L4 and S1 lamina as well as the L5 spinous process and lamina. The L5 spinous process and lamina were medially identified to be completely overrun with tumor and pathologic. So I initiated by initiated the decompression by removing the spinous process at L5 part of the inferior aspect of the spinous process at L4 and the superior aspect of the spinous process at S1. Then begun the laminotomy on the undersurface of the L4 lamina identify what I thought was normal dura and then marched inferiorly. The  spinolaminar complexes was erosive and necrotic. There is a dense amount of old hematoma as well as soft tissue density consistent with metastatic tumor in the epidural space. This was all debrided away with Kerrison rongeurs 4 Penfield nerve hooks marching laterally down the gutters are removed extensive amount of dorsal epidural tumor as well as lateral epidural tumor there was severe kinking of the thecal sac at the level of the L5 vertebral body. This significantly improved upon decompression. At the end of decompression all the foramina appear to be patent there is no further tumor on the dorsal aspect of thecal sac and it appeared to be full. I am concerned with instability as all this facet joint spinolaminar complexes were eroded however pedicles appeared to be intact and part of the lateral facet joints appeared to be intact. Meticulous hemostasis was then maintained with Surgifoam and Gelfoam and then I left a large Hemovac drain wounds and closed with interrupted Vicryl and a running 4 subcuticular Dermabond benzo and Steri-Strips a sterile dressing was applied patient recovered in stable condition. At the end the case on it counts sponge counts were correct.

## 2014-05-27 NOTE — Progress Notes (Signed)
Called Dr. Annamaria Boots office to inform per Awilda Metro, PA who was informed when pt is finished in surgery that someone from Dr. Annamaria Boots office needs to page hospitalist to admit patient. Spoke with Dr. Saintclair Halsted who confirmed. Pt being sent to admitting.

## 2014-05-27 NOTE — Progress Notes (Signed)
Called from cancer center about admission to [facility] for epidural tumor and RT required for this pt. Per my discussion with PA and MRI results quite an extensive tumor involvement my recommendation was to contact neurosx for input on manage,emt prior to admitting to [facility].  Called back by oncology, neurosurgery recommended to admit pt to [facility] and to take pt to OR. Hospitalist service can be available once surgery completed since pt apparently needs to go straight to OR.  Please call [phone] if hospitalist service needed.  Leisa Lenz [facility] [phone] or [phone]  .

## 2014-05-27 NOTE — Anesthesia Procedure Notes (Signed)
Procedure Name: Intubation Date/Time: 05/27/2014 6:27 PM Performed by: Maude Leriche D Pre-anesthesia Checklist: Patient identified, Emergency Drugs available, Suction available, Patient being monitored and Timeout performed Patient Re-evaluated:Patient Re-evaluated prior to inductionOxygen Delivery Method: Circle system utilized Intubation Type: IV induction Ventilation: Mask ventilation without difficulty and Oral airway inserted - appropriate to patient size Laryngoscope Size: Mac and 3 Grade View: Grade I Tube type: Oral Tube size: 7.5 mm Number of attempts: 1 Airway Equipment and Method: Stylet Placement Confirmation: ETT inserted through vocal cords under direct vision,  positive ETCO2 and breath sounds checked- equal and bilateral Secured at: 22 cm Tube secured with: Tape Dental Injury: Teeth and Oropharynx as per pre-operative assessment

## 2014-05-27 NOTE — Anesthesia Postprocedure Evaluation (Signed)
Anesthesia Post Note  Patient: Logan Russell  Procedure(s) Performed: Procedure(s) (LRB): L4-5,L5-S1 decompressive laminectomy (N/A)  Anesthesia type: General  Patient location: PACU  Post pain: Pain level controlled and Adequate analgesia  Post assessment: Post-op Vital signs reviewed, Patient's Cardiovascular Status Stable, Respiratory Function Stable, Patent Airway and Pain level controlled  Last Vitals:  Filed Vitals:   05/27/14 2056  BP:   Pulse: 103  Temp:   Resp: 18    Post vital signs: Reviewed and stable  Level of consciousness: awake, alert  and oriented  Complications: No apparent anesthesia complications

## 2014-05-27 NOTE — Telephone Encounter (Signed)
Informed patient they need to go to admitting office at the [address].  Written instructions given to pt and his wife.

## 2014-05-28 ENCOUNTER — Encounter (HOSPITAL_COMMUNITY): Payer: Self-pay | Admitting: Neurosurgery

## 2014-05-28 DIAGNOSIS — C3490 Malignant neoplasm of unspecified part of unspecified bronchus or lung: Secondary | ICD-10-CM

## 2014-05-28 DIAGNOSIS — C7951 Secondary malignant neoplasm of bone: Secondary | ICD-10-CM | POA: Diagnosis present

## 2014-05-28 DIAGNOSIS — R339 Retention of urine, unspecified: Secondary | ICD-10-CM | POA: Diagnosis present

## 2014-05-28 LAB — COMPREHENSIVE METABOLIC PANEL
ALBUMIN: 2.9 g/dL — AB (ref 3.5–5.2)
ALK PHOS: 66 U/L (ref 39–117)
ALT: 15 U/L (ref 0–53)
ANION GAP: 9 (ref 5–15)
AST: 17 U/L (ref 0–37)
BUN: 42 mg/dL — AB (ref 6–23)
CHLORIDE: 102 meq/L (ref 96–112)
CO2: 25 mmol/L (ref 19–32)
CREATININE: 2.39 mg/dL — AB (ref 0.50–1.35)
Calcium: 8.9 mg/dL (ref 8.4–10.5)
GFR calc Af Amer: 28 mL/min — ABNORMAL LOW (ref >90)
GFR calc non Af Amer: 25 mL/min — ABNORMAL LOW (ref >90)
Glucose, Bld: 121 mg/dL — ABNORMAL HIGH (ref 70–99)
POTASSIUM: 4.5 mmol/L (ref 3.5–5.1)
Sodium: 136 mmol/L (ref 135–145)
Total Bilirubin: 0.5 mg/dL (ref 0.3–1.2)
Total Protein: 6.2 g/dL (ref 6.0–8.3)

## 2014-05-28 LAB — CBC WITH DIFFERENTIAL/PLATELET
BASOS PCT: 0 % (ref 0–1)
Basophils Absolute: 0 10*3/uL (ref 0.0–0.1)
Eosinophils Absolute: 0 10*3/uL (ref 0.0–0.7)
Eosinophils Relative: 0 % (ref 0–5)
HEMATOCRIT: 27.8 % — AB (ref 39.0–52.0)
HEMOGLOBIN: 9.2 g/dL — AB (ref 13.0–17.0)
LYMPHS PCT: 4 % — AB (ref 12–46)
Lymphs Abs: 0.6 10*3/uL — ABNORMAL LOW (ref 0.7–4.0)
MCH: 30.6 pg (ref 26.0–34.0)
MCHC: 33.1 g/dL (ref 30.0–36.0)
MCV: 92.4 fL (ref 78.0–100.0)
MONO ABS: 0.4 10*3/uL (ref 0.1–1.0)
MONOS PCT: 3 % (ref 3–12)
NEUTROS PCT: 93 % — AB (ref 43–77)
Neutro Abs: 12 10*3/uL — ABNORMAL HIGH (ref 1.7–7.7)
Platelets: 328 10*3/uL (ref 150–400)
RBC: 3.01 MIL/uL — AB (ref 4.22–5.81)
RDW: 14.2 % (ref 11.5–15.5)
WBC: 13 10*3/uL — AB (ref 4.0–10.5)

## 2014-05-28 LAB — PROTIME-INR
INR: 1.27 (ref 0.00–1.49)
Prothrombin Time: 16 seconds — ABNORMAL HIGH (ref 11.6–15.2)

## 2014-05-28 MED ORDER — CEFAZOLIN SODIUM 1-5 GM-% IV SOLN
1.0000 g | Freq: Two times a day (BID) | INTRAVENOUS | Status: AC
  Administered 2014-05-28 – 2014-05-30 (×5): 1 g via INTRAVENOUS
  Filled 2014-05-28 (×5): qty 50

## 2014-05-28 MED ORDER — OXYCODONE HCL 5 MG PO TABS
20.0000 mg | ORAL_TABLET | Freq: Four times a day (QID) | ORAL | Status: DC
  Administered 2014-05-28 – 2014-06-03 (×26): 20 mg via ORAL
  Filled 2014-05-28 (×26): qty 4

## 2014-05-28 MED ORDER — ENSURE COMPLETE PO LIQD
237.0000 mL | Freq: Two times a day (BID) | ORAL | Status: DC
  Administered 2014-05-28 – 2014-06-02 (×6): 237 mL via ORAL

## 2014-05-28 MED ORDER — ADULT MULTIVITAMIN W/MINERALS CH
1.0000 | ORAL_TABLET | Freq: Every day | ORAL | Status: DC
  Administered 2014-05-28 – 2014-06-03 (×7): 1 via ORAL
  Filled 2014-05-28 (×7): qty 1

## 2014-05-28 MED ORDER — BOOST / RESOURCE BREEZE PO LIQD
1.0000 | ORAL | Status: DC
  Administered 2014-05-28 – 2014-06-02 (×5): 1 via ORAL

## 2014-05-28 NOTE — Progress Notes (Signed)
Orthopedic Tech Progress Note Patient Details:  Logan Russell 06/09/1934 [id number]  Patient ID: Lodema Pilot, male   DOB: 1934/10/01, 79 y.o.   MRN: [id number] Called in bio-tech brace order; spoke with Leona Singleton, Ozias Dicenzo 05/28/2014, 8:48 AM

## 2014-05-28 NOTE — Evaluation (Signed)
Physical Therapy Evaluation Patient Details Name: Logan Russell MRN: [id number] DOB: 02-02-1935 Today's Date: 05/28/2014   History of Present Illness  Patient is a 79 y/o male s/p L4-5,L5-S1 decompressive laminectomy for removal of metastatic tumor bilaterally. PMH of lung cancer and HTN. Recently the patient was found to have urinary retention with bilateral hydronephrosis. For which he has seen a urologist as an outpatient and a Foley catheter was inserted.     Clinical Impression  Patient presents with generalized weakness, pain and balance deficits s/p above surgery. Lengthy discussion with pt and pt's wife about disposition. Mobility limited due to elevated HR sitting EOB. HR ranged from 126-155 bpm during session. Education provided on back precautions. Pt would benefit from skilled PT to improve transfers, gait and mobility so pt can maximize independence and ease burden of care prior to return home.    Follow Up Recommendations SNF;Supervision/Assistance - 24 hour    Equipment Recommendations  None recommended by PT    Recommendations for Other Services       Precautions / Restrictions Precautions Precautions: Back;Fall Precaution Booklet Issued: Yes (comment) Precaution Comments: Reviewed back precautions and hand out. Required Braces or Orthoses: Spinal Brace Spinal Brace: Lumbar corset Restrictions Weight Bearing Restrictions: No      Mobility  Bed Mobility Overal bed mobility: Needs Assistance Bed Mobility: Rolling;Sidelying to Sit Rolling: Min guard Sidelying to sit: Min guard;HOB elevated       General bed mobility comments: HOB elevated 30 degrees, use of rails for support. Cues for log roll technique.  Transfers Overall transfer level: Needs assistance Equipment used: Rolling walker (2 wheeled) Transfers: Sit to/from Stand Sit to Stand: Min assist         General transfer comment: Min A to rise from EOB x2 with cues for hand  placement.  Ambulation/Gait Ambulation/Gait assistance: Min guard Ambulation Distance (Feet): 3 Feet Assistive device: Rolling walker (2 wheeled) Gait Pattern/deviations: Step-through pattern;Decreased stride length   Gait velocity interpretation: Below normal speed for age/gender General Gait Details: Pt with slow, mildly unsteady gait. Took a few steps to chair. Elevated HR to 155 bpm during session. RHR 120 bpm.  Stairs            Wheelchair Mobility    Modified Rankin (Stroke Patients Only)       Balance Overall balance assessment: Needs assistance Sitting-balance support: Feet supported;No upper extremity supported Sitting balance-Leahy Scale: Good Sitting balance - Comments: Able to weight shift and donn LSO with cues, no LOB. Able to perform AROM BLEs without LOB.   Standing balance support: During functional activity Standing balance-Leahy Scale: Poor Standing balance comment: Requires BUE support on RW for balance/safety.                             Pertinent Vitals/Pain Pain Assessment: 0-10 Pain Score: 4  Pain Location: back at surgical site Pain Descriptors / Indicators: Sore;Aching Pain Intervention(s): Limited activity within patient's tolerance;Monitored during session;Repositioned    Home Living Family/patient expects to be discharged to:: Private residence Living Arrangements: Spouse/significant other Available Help at Discharge: Family;Available 24 hours/day Type of Home: House       Home Layout: One level Home Equipment: Walker - 2 wheels      Prior Function Level of Independence: Needs assistance   Gait / Transfers Assistance Needed: For the last 2 weeks, pt has only been ambulating to bathroom due to weakness. Uses RW.  ADL's /  Homemaking Assistance Needed: Requires assist with ADLs for the last 2 weeks however wife has not felt safe getting him into a shower in this time.        Hand Dominance         Extremity/Trunk Assessment   Upper Extremity Assessment: Defer to OT evaluation           Lower Extremity Assessment: Generalized weakness;LLE deficits/detail;RLE deficits/detail RLE Deficits / Details: Grossly ~3+/5 throughout BLEs. LLE Deficits / Details: Grossly  ~3+/5 throughout BLEs.     Communication   Communication: No difficulties  Cognition Arousal/Alertness: Awake/alert Behavior During Therapy: WFL for tasks assessed/performed Overall Cognitive Status: Within Functional Limits for tasks assessed       Memory: Decreased recall of precautions (Only able to recall 2/3 back precautions at end of session.)              General Comments General comments (skin integrity, edema, etc.): Discussed disposition options with pt and pt's spouse. Wife concerned about not being able to handle patient at home. Pt concerned about needing to get to appts for radiation.    Exercises        Assessment/Plan    PT Assessment Patient needs continued PT services  PT Diagnosis Difficulty walking   PT Problem List Decreased strength;Cardiopulmonary status limiting activity;Decreased activity tolerance;Decreased balance;Decreased mobility;Decreased knowledge of precautions  PT Treatment Interventions Balance training;Patient/family education;Functional mobility training;Therapeutic activities;Therapeutic exercise;Stair training;Neuromuscular re-education;Gait training   PT Goals (Current goals can be found in the Care Plan section) Acute Rehab PT Goals Patient Stated Goal: to be independent PT Goal Formulation: With patient Time For Goal Achievement: 06/11/14 Potential to Achieve Goals: Fair    Frequency Min 5X/week   Barriers to discharge        Co-evaluation               End of Session Equipment Utilized During Treatment: Gait belt Activity Tolerance: Treatment limited secondary to medical complications (Comment) (Limited secondary to elevated HR.) Patient left:  in chair;with call bell/phone within reach;with family/visitor present Nurse Communication: Mobility status;Precautions         Time: 1352-1430 PT Time Calculation (min) (ACUTE ONLY): 38 min   Charges:   PT Evaluation $Initial PT Evaluation Tier I: 1 Procedure PT Treatments $Therapeutic Activity: 8-22 mins $Self Care/Home Management: 8-22   PT G CodesCandy Sledge A June 06, 2014, 3:47 PM  Candy Sledge, [place], DPT [phone]

## 2014-05-28 NOTE — Progress Notes (Signed)
Pharmacy note: ancef  79 yo male s/p decompressive laminectomy on ancef.  Ancef is being continued as he has a drain in place.  -SCr= 2.5 (up from 1.4 on 05/13/14)  Plan -Will reduce ancef to 1gm IV q12h due to increase in SCr -Continue ancef as ordered for surgical prophylaxis as patient has a drain in place  Hildred Laser, [place] D 05/28/2014 8:59 AM

## 2014-05-28 NOTE — Progress Notes (Signed)
Subjective: Patient reports That is feeling better improved pain in his legs improved numbness and tingling. Still some back pain.  Objective: Vital signs in last 24 hours: Temp:  [97.9 F (36.6 C)-98.9 F (37.2 C)] 98.9 F (37.2 C) (12/30 0600) Pulse Rate:  [101-116] 110 (12/30 0600) Resp:  [11-20] 20 (12/30 0600) BP: (109-150)/(69-82) 113/74 mmHg (12/30 0600) SpO2:  [98 %-100 %] 100 % (12/30 0600) Weight:  [79.652 kg (175 lb 9.6 oz)] 79.652 kg (175 lb 9.6 oz) (12/29 1404)  Intake/Output from previous day: 12/29 0701 - 12/30 0700 In: 1653 [I.V.:1403; IV Piggyback:250] Out: 1220 [Urine:670; Drains:50; Blood:500] Intake/Output this shift:    Strength 5 out of 5 wound clean dry and intact  Lab Results:  Recent Labs  05/27/14 1340 05/27/14 2044  WBC 12.4*  --   HGB 11.5* 9.7*  HCT 36.0* 29.4*  PLT 360  --    BMET  Recent Labs  05/27/14 1341  NA 137  K 4.1  CO2 22  GLUCOSE 108  BUN 47.2*  CREATININE 2.5*  CALCIUM 9.9    Studies/Results: Dg Lumbar Spine 1 View  05/27/2014   CLINICAL DATA:  L5 lumbar laminectomy.  EXAM: LUMBAR SPINE - 1 VIEW  COMPARISON:  05/24/2014  FINDINGS: A single intraoperative lateral view of the lumbar spine is submitted postoperatively for interpretation.  A posterior metallic probe overlies the L5 spinous process directed towards the L4-5 interspace.  IMPRESSION: Localizer.   Electronically Signed   By: Hassan Rowan M.D.   On: 05/27/2014 19:18    Assessment/Plan: Mobilize with physical and occupational therapy patient can be discharged when he feels like he is able to take care of himself at home pains well-controlled on pills and cleared by physical therapy we'll keep the Hemovac drain going until discharge.  LOS: 1 day     Jahmier Willadsen P 05/28/2014, 8:31 AM

## 2014-05-28 NOTE — Progress Notes (Signed)
INITIAL NUTRITION ASSESSMENT  DOCUMENTATION CODES Per approved criteria  -Severe malnutrition in the context of acute illness or injury  Pt meets criteria for SEVERE MALNUTRITION in the context of ACUTE ILLNESS/INJURY as evidenced by >12% weight loss in 2 months, estimated energy intake <50% of estimated needs for > 5 days, and moderate muscle wasting.  INTERVENTION: Provide Ensure Complete BID, each supplement provides 350 kcal and 13 grams of protein Provide Resource Breeze once daily Provide Multivitamin with minerals daily Encourage PO intake  NUTRITION DIAGNOSIS: Inadequate oral intake related to poor appetite as evidenced by 12% weight loss in less than 2 months.   Goal: Pt to meet >/= 90% of their estimated nutrition needs    Monitor:  PO intake, weight trend, labs  Reason for Assessment: Positive Malnutrition Screening Tool  79 y.o. male  Admitting Dx: Cauda equina compression  ASSESSMENT: 79 y.o. male with Past medical history of hypertension, lung cancer, radiation. The patient presented with complaints of leg weakness. He was complaining of lower back pain and for this had an MRI of his lumbar spine. The MRI was showing evidence of severe metastatic tumor infiltration at the L5 level along with L4 and S1 involvement with obliteration of the spinal canal. He was also found to have metastatic lymphadenopathy. Along with urinary retention he was also having difficulty with bowel movements.  S/P Decompressive lumbar laminectomies from L4 to S1 for removal of metastatic tumor bilateral.   Pt reports that he usually weighs 200 lbs. For the past 2 months he has had been in pain and has had a very poor appetite, eating about 50% less than usual, and losing 25 lbs. Pt reports poor appetite today and he was unable to eat any breakfast. Weight history shows pt has lost >12% of his body weight in the past 2 months.  RD brought pt an Ensure Complete which he sipped on during visit. RD  emphasized the importance of nutrition and encouraged adequate intake especially of protein-rich foods to promote wound healing. Pt feels he will eat better again at home and is agreeable to nutritional supplements until appetite improves.  Note, last BM was 12/24- Colace and Senokot orders in place.   Nutrition Focused Physical Exam:  Subcutaneous Fat:  Orbital Region: mild wasting Upper Arm Region: mild wasting Thoracic and Lumbar Region: wnl  Muscle:  Temple Region: wnl Clavicle Bone Region: moderate wasting Clavicle and Acromion Bone Region: moderate wasting Scapular Bone Region: mild wasting Dorsal Hand: mild wasting Patellar Region: mild wasting Anterior Thigh Region: moderate wasting Posterior Calf Region: NA  Edema: none noted   Height: Ht Readings from Last 1 Encounters:  05/27/14 5\' 5"  (1.651 m)    Weight: Wt Readings from Last 1 Encounters:  05/27/14 175 lb 9.6 oz (79.652 kg)    Ideal Body Weight: 142 lbs  % Ideal Body Weight: 123%  Wt Readings from Last 10 Encounters:  05/27/14 175 lb 9.6 oz (79.652 kg)  05/13/14 192 lb (87.091 kg)  04/22/14 204 lb 4.8 oz (92.67 kg)  04/08/14 198 lb 1.6 oz (89.858 kg)  03/11/14 200 lb (90.719 kg)  02/25/14 200 lb 6.4 oz (90.901 kg)  02/11/14 200 lb 3.2 oz (90.81 kg)  01/28/14 197 lb 11.2 oz (89.676 kg)  01/09/14 191 lb 4.8 oz (86.773 kg)  12/24/13 191 lb 6.4 oz (86.818 kg)    Usual Body Weight: 200 lbs  % Usual Body Weight: 88%  BMI: Body Mass Index of 29.1 kg/(m^2)  Estimated Nutritional  Needs: Kcal: 2000-2200 Protein: 105-115 grams Fluid: 2-2.2 L/day  Skin: +1 RLE and LLE edema; stage II pressure ulcer on buttocks; closed incision on back  Diet Order: Diet Heart  EDUCATION NEEDS: -No education needs identified at this time   Intake/Output Summary (Last 24 hours) at 05/28/14 1041 Last data filed at 05/28/14 0154  Gross per 24 hour  Intake   1653 ml  Output   1220 ml  Net    433 ml    Last BM:  12/24  Labs:   Recent Labs Lab 05/27/14 1341 05/28/14 0805  NA 137 136  K 4.1 4.5  CL  --  102  CO2 22 25  BUN 47.2* 42*  CREATININE 2.5* 2.39*  CALCIUM 9.9 8.9  GLUCOSE 108 121*    CBG (last 3)  No results for input(s): GLUCAP in the last 72 hours.  Scheduled Meds: .  ceFAZolin (ANCEF) IV  1 g Intravenous Q12H  . docusate sodium  100 mg Oral BID  . folic acid  1 mg Oral Daily  . oxyCODONE  20 mg Oral QID  . potassium chloride SA  20 mEq Oral BID  . senna  1 tablet Oral BID  . sodium chloride  3 mL Intravenous Q12H  . tamsulosin  0.4 mg Oral BID    Continuous Infusions: . sodium chloride      Past Medical History  Diagnosis Date  . Hypertension   . History of radiation therapy 05/02/12-06/19/12    left lung mediastinal lymph nodes 66Gy/33fx  . Lung cancer 04/12/2012    History reviewed. No pertinent past surgical history.  Pryor Ochoa RD, LDN Inpatient Clinical Dietitian Pager: [phone] After Hours Pager: [phone]

## 2014-05-28 NOTE — Progress Notes (Addendum)
Patient arrived back to room from PACU around 2145 surgery went well received report from PACU nurse patient not complaining of pain, dressings are clean dry and intact. Small amount of drainage from hemo vac, he is resting quietly, will continue to monitor.

## 2014-05-28 NOTE — Progress Notes (Signed)
Orthopedic Tech Progress Note Patient Details:  Logan Russell 1935-02-06 [id number]  Patient ID: Logan Russell, male   DOB: [birth year], 79 y.o.   MRN: [id number] Brace order completed by Warnell Forester, Michaila Kenney 05/28/2014, 12:04 PM

## 2014-05-28 NOTE — Progress Notes (Signed)
CARE MANAGEMENT NOTE 05/28/2014  Patient:  Logan Russell, Logan Russell   Account Number:  [id number]  Date Initiated:  05/28/2014  Documentation initiated by:  Olga Coaster  Subjective/Objective Assessment:   ADMITTED FOR SURGERY     Action/Plan:   CM FOLLOWING FOR DCP   Anticipated DC Date:  05/31/2014   Anticipated DC Plan:  AWAITING FOR PT/OT EVALS FOR DISPOSITION NEEDS     DC Planning Services  CM consult         Status of service:  In process, will continue to follow Medicare Important Message given?   (If response is "NO", the following Medicare IM given date fields will be blank)  Per UR Regulation:  Reviewed for med. necessity/level of care/duration of stay  Comments:  12/30/2015Mindi Slicker RN,BSN,MHA [phone]

## 2014-05-28 NOTE — Progress Notes (Signed)
OT Cancellation Note  Patient Details Name: TIMOUTHY GILARDI MRN: [id number] DOB: 04-17-1935   Cancelled Treatment:    Reason Eval/Treat Not Completed: Patient not medically ready;Other (comment). Awaiting lumbar corsett and pt continues to be on bedrest per chart, although pt's nurse states that pt has verbal orders to be up with therapy. Will re attempt later today as appropriate/as time allows  Britt Bottom 05/28/2014, 9:18 AM

## 2014-05-28 NOTE — Progress Notes (Signed)
Subjective: Patient admitted this morning , see detailed H&p by Dr patel, after the decompressive laminectomy L4- S1 for the compressing metastatic tumor.  Filed Vitals:   05/28/14 1436  BP: 109/63  Pulse: 123  Temp: 99.1 F (37.3 C)  Resp: 18    Chest: Clear Bilaterally Heart : S1S2 RRR Abdomen: Soft, nontender Ext : No edema Neuro: Alert, oriented x 3  A/P  S/p Decompressive laminectomy Pain well controlled. Home when stable   Black Hospitalist [phone]

## 2014-05-28 NOTE — H&P (Signed)
[facility] History and Physical  Patient: Logan Russell  ERX:[id number]  DOB: [birth year]  DOS: the patient was seen and examined on 05/28/2014 PCP: [name], MD  Chief Complaint: Leg weakness  HPI: Logan Russell is a 79 y.o. male with Past medical history of hypertension, lung cancer, radiation. The patient presented with complaints of leg weakness. Recently the patient was found to have urinary retention with bilateral hydronephrosis. For which he has seen a urologist as an outpatient and a Foley catheter was inserted and patient was started on Flomax. This was in November 2015. He was complaining of lower back pain later on and for that his PCP of pain and MRI of his lumbar spine. The MRI was showing evidence of severe metastatic tumor infiltration at the L5 level along with L4 and S1 involvement with obliteration of the spinal canal. She was also found to have metastatic lymphadenopathy. Along with urinary retention he was also having difficulty with bowel movements. Initially the patient wasn't [place] at which time case was discussed with radiation oncology as well as neurosurgery. It was later on decided that the patient will be transferred to [facility] for neurosurgical intervention. And patient was seen after the surgery. At the time of my evaluation patient complains of some sore throat but does not have any complaint of chest pain and abdominal pain nausea vomiting fever or chills dizziness or lightheadedness or any focal deficit.  The patient is coming from home. And at his baseline independent for most of his ADL.  Review of Systems: as mentioned in the history of present illness.  A Comprehensive review of the other systems is negative.  Past Medical History  Diagnosis Date  . Hypertension   . History of radiation therapy 05/02/12-06/19/12    left lung mediastinal lymph nodes 66Gy/14fx  . Lung cancer 04/12/2012   History reviewed.  No pertinent past surgical history. Social History:  reports that he quit smoking about 2 years ago. He does not have any smokeless tobacco history on file. He reports that he does not drink alcohol. His drug history is not on file.  No Known Allergies  Family History  Problem Relation Age of Onset  . Heart disease Father     Prior to Admission medications   Medication Sig Start Date End Date Taking? Authorizing Provider  dexamethasone (DECADRON) 4 MG tablet Take 1 tablet (4 mg total) by mouth 4 (four) times daily. Take with food 05/27/14   Carlton Adam, PA-C  folic acid (FOLVITE) 1 MG tablet Take 1 tablet (1 mg total) by mouth daily. Patient not taking: Reported on 05/27/2014 11/05/13   Curt Bears, MD  furosemide (LASIX) 80 MG tablet Take 80 mg by mouth daily. 05/19/14   Historical Provider, MD  guaiFENesin (MUCINEX) 600 MG 12 hr tablet Take 1,200 mg by mouth 2 (two) times daily as needed.     Historical Provider, MD  Oxycodone HCl 20 MG TABS Take 20 mg by mouth 4 (four) times daily. 05/19/14   Historical Provider, MD  oxyCODONE-acetaminophen (PERCOCET) 10-325 MG per tablet Take 1 tablet by mouth every 4 (four) hours as needed for pain.    [name], MD  potassium chloride SA (K-DUR,KLOR-CON) 20 MEQ tablet Take 1 tablet (20 mEq total) by mouth 2 (two) times daily. Take 76meq daily x 7 days 05/13/14   Curt Bears, MD  tamsulosin (FLOMAX) 0.4 MG CAPS capsule Take 0.4 mg by mouth 2 (two) times  daily. 05/08/14   Historical Provider, MD  triamterene-hydrochlorothiazide (MAXZIDE) 75-50 MG per tablet Take 1 tablet by mouth daily.    Historical Provider, MD    Physical Exam: Filed Vitals:   05/27/14 2110 05/27/14 2211 05/28/14 0153 05/28/14 0600  BP:  109/70 112/80 113/74  Pulse: 102 107 105 110  Temp: 98 F (36.7 C) 98.4 F (36.9 C) 98.2 F (36.8 C) 98.9 F (37.2 C)  TempSrc:  Oral Oral Oral  Resp: 11 16 18 20   SpO2: 100% 98% 100% 100%    General: Alert, Awake  and Oriented to Time, Place and Person. Appear in mild distress Eyes: PERRL ENT: Oral Mucosa clear moist. Neck: no JVD Cardiovascular: S1 and S2 Present, no Murmur, Peripheral Pulses Present Respiratory: Bilateral Air entry equal and Decreased, Clear to Auscultation, noCrackles, no wheezes Abdomen: Bowel Sound present, Soft and non tender Skin: no Rash Extremities: no Pedal edema, no calf tenderness Neurologic: Grossly no focal neuro deficit on limited examination  Labs on Admission:  CBC:  Recent Labs Lab 05/27/14 1340 05/27/14 2044  WBC 12.4*  --   NEUTROABS 10.8*  --   HGB 11.5* 9.7*  HCT 36.0* 29.4*  MCV 92.9  --   PLT 360  --     CMP     Component Value Date/Time   NA 137 05/27/2014 1341   K 4.1 05/27/2014 1341   CL 106 10/08/2012 1028   CO2 22 05/27/2014 1341   GLUCOSE 108 05/27/2014 1341   GLUCOSE 82 10/08/2012 1028   BUN 47.2* 05/27/2014 1341   CREATININE 2.5* 05/27/2014 1341   CALCIUM 9.9 05/27/2014 1341   PROT 7.5 05/27/2014 1341   ALBUMIN 3.0* 05/27/2014 1341   AST 20 05/27/2014 1341   ALT 19 05/27/2014 1341   ALKPHOS 89 05/27/2014 1341   BILITOT 0.48 05/27/2014 1341    No results for input(s): LIPASE, AMYLASE in the last 168 hours. No results for input(s): AMMONIA in the last 168 hours.  No results for input(s): CKTOTAL, CKMB, CKMBINDEX, TROPONINI in the last 168 hours. BNP (last 3 results) No results for input(s): PROBNP in the last 8760 hours.  Radiological Exams on Admission: Dg Lumbar Spine 1 View  05/27/2014   CLINICAL DATA:  L5 lumbar laminectomy.  EXAM: LUMBAR SPINE - 1 VIEW  COMPARISON:  05/24/2014  FINDINGS: A single intraoperative lateral view of the lumbar spine is submitted postoperatively for interpretation.  A posterior metallic probe overlies the L5 spinous process directed towards the L4-5 interspace.  IMPRESSION: Localizer.   Electronically Signed   By: Hassan Rowan M.D.   On: 05/27/2014 19:18     Assessment/Plan Principal  Problem:   Cauda equina compression Active Problems:   Lung cancer   Hypertension   Epidural mass   Urinary retention   1. Cauda equina compression The patient is presenting with complaints of spinal cord compression secondary to metastatic tumor. Patient has undergone laminectomy with removal of the tumor and based on the operating note dictated has tolerated the procedure well. Currently patient's only complaint is sore throat for which she is on Cepacol lozenges. Currently I would continue him on bedrest with a Foley catheter. We'll discuss with neurosurgery in the morning about further ambulatory  recommendation. Pain management as per neurosurgery. Patient may require further discussion with radiation oncology as well, we will be able to get further information after discussion with neurosurgery.  2. Lung cancer. Oncology will be consulted in the morning.  3. Essential hypertension. Holding  patient's diuretic at present.  4. Urinary retention. Continue Flomax and keeping the patient on Foley catheter.  Advance goals of care discussion: Full code as per initial discussion perioperatively   by neurosurgeon. Prior discussion by the patient's oncologist states no CODE BLUE  Consults:  Neurosurgery  DVT Prophylaxis: mechanical compression device Nutrition:  Nothing by mouth except medication  Disposition: Admitted to inpatient in med-surge unit.  Author: Berle Mull, MD [facility] Pager: [phone] 05/28/2014, 6:04 AM    If 7PM-7AM, please contact night-coverage [email or web address] Password [email or web address]

## 2014-05-29 DIAGNOSIS — C7951 Secondary malignant neoplasm of bone: Secondary | ICD-10-CM

## 2014-05-29 DIAGNOSIS — G834 Cauda equina syndrome: Secondary | ICD-10-CM

## 2014-05-29 DIAGNOSIS — G9619 Other disorders of meninges, not elsewhere classified: Secondary | ICD-10-CM

## 2014-05-29 DIAGNOSIS — I10 Essential (primary) hypertension: Secondary | ICD-10-CM

## 2014-05-29 DIAGNOSIS — E43 Unspecified severe protein-calorie malnutrition: Secondary | ICD-10-CM | POA: Insufficient documentation

## 2014-05-29 MED ORDER — POLYETHYLENE GLYCOL 3350 17 G PO PACK
17.0000 g | PACK | Freq: Every day | ORAL | Status: DC
  Administered 2014-05-29 – 2014-06-02 (×2): 17 g via ORAL
  Filled 2014-05-29 (×4): qty 1

## 2014-05-29 MED ORDER — BISACODYL 10 MG RE SUPP
10.0000 mg | Freq: Every day | RECTAL | Status: DC | PRN
  Administered 2014-05-29: 10 mg via RECTAL
  Filled 2014-05-29: qty 1

## 2014-05-29 NOTE — Progress Notes (Signed)
Physical Therapy Treatment Patient Details Name: Logan Russell MRN: [id number] DOB: [birth year] Today's Date: 05/29/2014    History of Present Illness Patient is a 79 y/o male s/p L4-5,L5-S1 decompressive laminectomy for removal of metastatic tumor bilaterally. PMH of lung cancer and HTN. Recently the patient was found to have urinary retention with bilateral hydronephrosis. For which he has seen a urologist as an outpatient and a Foley catheter was inserted.     PT Comments    Patient progressing well with mobility. Improved ambulation distance today however fatigues quickly. Continues to exhibit abnormal rise in HR with activity. RN aware. Pt only able to recall 1/3 back precautions. Will continue to follow to improve overall functional mobility so pt can safely return home with wife.   Follow Up Recommendations  Home health PT;Supervision/Assistance - 24 hour     Equipment Recommendations  None recommended by PT    Recommendations for Other Services       Precautions / Restrictions Precautions Precautions: Back;Fall Precaution Comments: Pt only able to recall 1/3 back precautions. Required Braces or Orthoses: Spinal Brace Spinal Brace: Lumbar corset Restrictions Weight Bearing Restrictions: No    Mobility  Bed Mobility Overal bed mobility: Needs Assistance Bed Mobility: Rolling;Sidelying to Sit Rolling: Min assist Sidelying to sit: Min assist       General bed mobility comments: HOB flat, no use of rails. MIn A to roll to Right and Min A to elevate trunk.   Transfers Overall transfer level: Needs assistance Equipment used: Rolling walker (2 wheeled) Transfers: Sit to/from Stand Sit to Stand: Min assist         General transfer comment: Min A to rise from EOB x1, from chair x1 with cues for hand placement.  Ambulation/Gait Ambulation/Gait assistance: Min guard Ambulation Distance (Feet): 75 Feet (x2 bouts) Assistive device: Rolling walker (2 wheeled) Gait  Pattern/deviations: Step-through pattern;Decreased stride length;Trunk flexed   Gait velocity interpretation: Below normal speed for age/gender General Gait Details: Pt with slow, mildly unsteady gait. Cues for upright posture and safety with RW. 1 seated rest break. RHR 105 bpm. HR elevated 150 bpm during session.   Stairs            Wheelchair Mobility    Modified Rankin (Stroke Patients Only)       Balance Overall balance assessment: Needs assistance Sitting-balance support: Feet supported;No upper extremity supported Sitting balance-Leahy Scale: Good Sitting balance - Comments: Able to weight shift and donn LSO with cues, no LOB.    Standing balance support: During functional activity Standing balance-Leahy Scale: Poor                      Cognition Arousal/Alertness: Awake/alert Behavior During Therapy: WFL for tasks assessed/performed Overall Cognitive Status: Within Functional Limits for tasks assessed       Memory: Decreased recall of precautions              Exercises      General Comments        Pertinent Vitals/Pain Pain Assessment: 0-10 Pain Score: 5  Pain Location: back at surgical site Pain Descriptors / Indicators: Sore;Aching Pain Intervention(s): Monitored during session;Repositioned;Patient requesting pain meds-RN notified    Home Living                      Prior Function            PT Goals (current goals can now be found in the care plan  section) Progress towards PT goals: Progressing toward goals    Frequency  Min 5X/week    PT Plan Discharge plan needs to be updated    Co-evaluation             End of Session Equipment Utilized During Treatment: Gait belt Activity Tolerance: Patient limited by fatigue (Elevated HR) Patient left: in chair;with call bell/phone within reach;with family/visitor present     Time: 4799-8721 PT Time Calculation (min) (ACUTE ONLY): 28 min  Charges:  $Gait  Training: 8-22 mins $Therapeutic Activity: 8-22 mins                    G CodesCandy Sledge A Jun 14, 2014, 10:34 AM Candy Sledge, PT, DPT [phone]

## 2014-05-29 NOTE — Progress Notes (Signed)
Patient ID: Logan Russell, male   DOB: 19-Oct-1934, 79 y.o.   MRN: [id number] Patient doing well condition back pain but no leg pain and numbness improved  Neurologically stable still some slight weakness in dorsiflexion and EHL the left foot that's baseline incision clean dry and intact  Continue to mobilize with physical and occupational therapy continue his Hemovac until immediately prior to discharge or it can be DC'd tomorrow morning depending on which one's first. Patient may be discharged when cleared from physical therapy if he is stable for home health. Should he need rehabilitation then rehabilitation consult may be needed. Wear the brace whenever he is out of bed

## 2014-05-29 NOTE — Evaluation (Signed)
Occupational Therapy Evaluation Patient Details Name: Logan Russell MRN: [id number] DOB: 04-Aug-1934 Today's Date: 05/29/2014    History of Present Illness Patient is a 79 y/o male s/p L4-5,L5-S1 decompressive laminectomy for removal of metastatic tumor bilaterally. PMH of lung cancer and HTN. Recently the patient was found to have urinary retention with bilateral hydronephrosis. For which he has seen a urologist as an outpatient and a Foley catheter was inserted.    Clinical Impression   Patient is s/p L4-5 L5-s1 surgery resulting in functional limitations due to the deficits listed below (see OT problem list). Pta wife (A)ing with Adls and progressive decline. Patient will benefit from skilled OT acutely to increase independence and safety with ADLS to allow discharge HHOT and 3n1.     Follow Up Recommendations  Home health OT;Supervision/Assistance - 24 hour    Equipment Recommendations  3 in 1 bedside comode    Recommendations for Other Services       Precautions / Restrictions Precautions Precautions: Back;Fall Precaution Comments: Pt only able to recall 1/3 back precautions. Required Braces or Orthoses: Spinal Brace Spinal Brace: Lumbar corset      Mobility Bed Mobility               General bed mobility comments: in chair on arrival  Transfers Overall transfer level: Needs assistance Equipment used: Rolling walker (2 wheeled) Transfers: Sit to/from Stand Sit to Stand: Min assist         General transfer comment: hand placement and safety    Balance Overall balance assessment: Needs assistance         Standing balance support: During functional activity;Single extremity supported Standing balance-Leahy Scale: Poor                              ADL Overall ADL's : Needs assistance/impaired     Grooming: Oral care;Standing;Minimal assistance Grooming Details (indicate cue type and reason): completed denture care Upper Body Bathing:  Min guard;Sitting               Toilet Transfer: Min guard;Ambulation;RW           Functional mobility during ADLs: Minimal assistance;Rolling walker General ADL Comments: Pt educated on back preacautions with adls and wife present. Handout reviewed in detail.     Vision                     Perception     Praxis      Pertinent Vitals/Pain Pain Intervention(s): Monitored during session;Repositioned     Hand Dominance Right   Extremity/Trunk Assessment Upper Extremity Assessment Upper Extremity Assessment: Generalized weakness   Lower Extremity Assessment Lower Extremity Assessment: Defer to PT evaluation   Cervical / Trunk Assessment Cervical / Trunk Assessment: Kyphotic   Communication Communication Communication: No difficulties   Cognition Arousal/Alertness: Awake/alert Behavior During Therapy: WFL for tasks assessed/performed Overall Cognitive Status: Within Functional Limits for tasks assessed                     General Comments       Exercises       Shoulder Instructions      Home Living Family/patient expects to be discharged to:: Private residence Living Arrangements: Spouse/significant other Available Help at Discharge: Family;Available 24 hours/day Type of Home: House       Home Layout: One level  Home Equipment: [place] - 2 wheels          Prior Functioning/Environment Level of Independence: Needs assistance  Gait / Transfers Assistance Needed: For the last 2 weeks, pt has only been ambulating to bathroom due to weakness. Uses RW. ADL's / Homemaking Assistance Needed: Requires assist with ADLs for the last 2 weeks however wife has not felt safe getting him into a shower in this time.        OT Diagnosis: Generalized weakness;Acute pain   OT Problem List: Decreased strength;Decreased activity tolerance;Impaired balance (sitting and/or standing);Decreased safety awareness;Decreased knowledge  of use of DME or AE;Decreased knowledge of precautions   OT Treatment/Interventions: Self-care/ADL training;Therapeutic exercise;DME and/or AE instruction;Therapeutic activities;Patient/family education;Balance training    OT Goals(Current goals can be found in the care plan section) Acute Rehab OT Goals Patient Stated Goal: to be independent OT Goal Formulation: With patient Time For Goal Achievement: 06/12/14 Potential to Achieve Goals: Good  OT Frequency: Min 2X/week   Barriers to D/C:            Co-evaluation              End of Session Equipment Utilized During Treatment: Gait belt;Rolling walker Nurse Communication: Mobility status;Precautions  Activity Tolerance: Patient tolerated treatment well Patient left: in chair;with call bell/phone within reach;with chair alarm set;with family/visitor present   Time: 0045-9977 OT Time Calculation (min): 44 min Charges:  OT General Charges $OT Visit: 1 Procedure OT Evaluation $Initial OT Evaluation Tier I: 1 Procedure OT Treatments $Self Care/Home Management : 38-52 mins G-Codes:    Peri Maris 06-07-2014, 4:07 PM Pager: [phone]

## 2014-05-29 NOTE — Progress Notes (Signed)
Pt c/o that he has not had bowel activity in days. Abdominal assessment done soft, non tender,with active bowel sounds. Pt has been on Senna routine. Spoke with Md who put order in for Dulcolax suppository prn which has been administered.  Pt has ambulated with brace and rolling walker standy assist with this nurse x2 to the bathroom to attempt to have a bowel movement with no success. Pt has gas. Will continue to monitor.

## 2014-05-29 NOTE — Progress Notes (Signed)
[facility] PROGRESS NOTE  JERAN HILTZ XNA:[id number] DOB: 12-09-1934 DOA: 05/27/2014 PCP: [name], MD  Assessment/Plan: 1. S/p decompressive laminectomy- patient presented  with complaints of spinal cord compression secondary to metastatic tumor. Patient has undergone laminectomy with removal of the tumor. Neurosurgery following. 2. Non small cell lung cancer- Patient followed by the oncology as outpatient. Immunotherapy is on hold for now. 3. Constipation- Will start Miralax daily, dulcolax prn. Patient is also on Senna 1 tab po BID 4. Back pain- Patient is on high dose Oxycodone 20 mg po QID 5. Acute on CKD stage III- baseline creatinine is 1.4-2.0, now cr is 2.39, will repeat bmp in am. 6. DVT prophylaxis- SCD  Code Status: Full code Family Communication: *No family at bedside Disposition Plan: Home vs SNF   Consultants:  Neurosurgery  Procedures:  Decompressive laminectomy  Antibiotics:  None  HPI/Subjective: 79 y.o. male with Past medical history of hypertension, lung cancer, radiation. The patient presented with complaints of leg weakness. Recently the patient was found to have urinary retention with bilateral hydronephrosis. For which he has seen a urologist as an outpatient and a Foley catheter was inserted and patient was started on Flomax. This was in November 2015. He was complaining of lower back pain later on and for that his PCP of pain and MRI of his lumbar spine. The MRI was showing evidence of severe metastatic tumor infiltration at the L5 level along with L4 and S1 involvement with obliteration of the spinal canal. She was also found to have metastatic lymphadenopathy. Along with urinary retention he was also having difficulty with bowel movements. Initially the patient wasn't [place] at which time case was discussed with radiation oncology as well as neurosurgery. It was later on decided that the patient will be transferred  to [facility] for neurosurgical intervention. And patient was seen after the surgery. At the time of my evaluation patient complains of some sore throat but does not have any complaint of chest pain and abdominal pain nausea vomiting fever or chills dizziness or lightheadedness or any focal deficit.  Patient complains of constipation.  Objective: Filed Vitals:   05/29/14 0930  BP: 119/61  Pulse: 97  Temp: 98.8 F (37.1 C)  Resp: 18    Intake/Output Summary (Last 24 hours) at 05/29/14 1321 Last data filed at 05/29/14 1243  Gross per 24 hour  Intake    600 ml  Output    800 ml  Net   -200 ml   There were no vitals filed for this visit.  Exam:  Physical Exam: Neck: Supple, no deformities, masses, or tenderness Lungs: Normal respiratory effort, bilateral clear to auscultation, no crackles or wheezes.  Heart: Regular rate and rhythm, S1 and S2 normal, no murmurs, rubs auscultated Abdomen: BS normoactive,soft,nondistended,non-tender to palpation,no organomegaly Extremities: No pretibial edema, no erythema, no cyanosis, no clubbing Neuro : Alert and oriented to time, place and person, No focal deficits   Data Reviewed: Basic Metabolic Panel:  Recent Labs Lab 05/27/14 1341 05/28/14 0805  NA 137 136  K 4.1 4.5  CL  --  102  CO2 22 25  GLUCOSE 108 121*  BUN 47.2* 42*  CREATININE 2.5* 2.39*  CALCIUM 9.9 8.9   Liver Function Tests:  Recent Labs Lab 05/27/14 1341 05/28/14 0805  AST 20 17  ALT 19 15  ALKPHOS 89 66  BILITOT 0.48 0.5  PROT 7.5 6.2  ALBUMIN 3.0* 2.9*   No results for input(s):  LIPASE, AMYLASE in the last 168 hours. No results for input(s): AMMONIA in the last 168 hours. CBC:  Recent Labs Lab 20-Jun-2014 1340 06/20/14 2044 05/28/14 0805  WBC 12.4*  --  13.0*  NEUTROABS 10.8*  --  12.0*  HGB 11.5* 9.7* 9.2*  HCT 36.0* 29.4* 27.8*  MCV 92.9  --  92.4  PLT 360  --  328    Studies: Dg Lumbar Spine 1 View  06-20-14   CLINICAL DATA:   L5 lumbar laminectomy.  EXAM: LUMBAR SPINE - 1 VIEW  COMPARISON:  05/24/2014  FINDINGS: A single intraoperative lateral view of the lumbar spine is submitted postoperatively for interpretation.  A posterior metallic probe overlies the L5 spinous process directed towards the L4-5 interspace.  IMPRESSION: Localizer.   Electronically Signed   By: Hassan Rowan M.D.   On: 2014-06-20 19:18    Scheduled Meds: .  ceFAZolin (ANCEF) IV  1 g Intravenous Q12H  . docusate sodium  100 mg Oral BID  . feeding supplement (ENSURE COMPLETE)  237 mL Oral BID PC  . feeding supplement (RESOURCE BREEZE)  1 Container Oral Q24H  . folic acid  1 mg Oral Daily  . multivitamin with minerals  1 tablet Oral Daily  . oxyCODONE  20 mg Oral QID  . polyethylene glycol  17 g Oral Daily  . potassium chloride SA  20 mEq Oral BID  . senna  1 tablet Oral BID  . sodium chloride  3 mL Intravenous Q12H  . tamsulosin  0.4 mg Oral BID   Continuous Infusions: . sodium chloride      Principal Problem:   Cauda equina compression Active Problems:   Lung cancer   Hypertension   Epidural mass   Urinary retention   Metastatic cancer to spine   Protein-calorie malnutrition, severe    Time spent: 25 min    [facility] Pager [phone]. If 7PM-7AM, please contact night-coverage at [email or web address], password [facility] 05/29/2014, 1:21 PM  LOS: 2 days

## 2014-05-30 DIAGNOSIS — R339 Retention of urine, unspecified: Secondary | ICD-10-CM

## 2014-05-30 LAB — BASIC METABOLIC PANEL
ANION GAP: 11 (ref 5–15)
BUN: 37 mg/dL — ABNORMAL HIGH (ref 6–23)
CALCIUM: 8.6 mg/dL (ref 8.4–10.5)
CHLORIDE: 101 meq/L (ref 96–112)
CO2: 23 mmol/L (ref 19–32)
Creatinine, Ser: 1.83 mg/dL — ABNORMAL HIGH (ref 0.50–1.35)
GFR calc Af Amer: 39 mL/min — ABNORMAL LOW (ref >90)
GFR calc non Af Amer: 34 mL/min — ABNORMAL LOW (ref >90)
Glucose, Bld: 109 mg/dL — ABNORMAL HIGH (ref 70–99)
POTASSIUM: 3.4 mmol/L — AB (ref 3.5–5.1)
SODIUM: 135 mmol/L (ref 135–145)

## 2014-05-30 MED ORDER — POTASSIUM CHLORIDE CRYS ER 20 MEQ PO TBCR
40.0000 meq | EXTENDED_RELEASE_TABLET | Freq: Once | ORAL | Status: AC
  Administered 2014-05-30: 40 meq via ORAL
  Filled 2014-05-30: qty 2

## 2014-05-30 NOTE — Progress Notes (Signed)
Physical Therapy Treatment Patient Details Name: Logan Russell MRN: [id number] DOB: Dec 30, 1934 Today's Date: 05/30/2014    History of Present Illness Patient is a 80 y/o male s/p L4-5,L5-S1 decompressive laminectomy for removal of metastatic tumor bilaterally. PMH of lung cancer and HTN. Recently the patient was found to have urinary retention with bilateral hydronephrosis. For which he has seen a urologist as an outpatient and a Foley catheter was inserted.     PT Comments    Patient progressing slowly with mobility. Pt requires Min A for gait training today due to increased knee flexion bilaterally and shuffling of feet. Constant cues for upright posture and RW proximity. Fatigues quickly requiring seated rest breaks. Pt agreeable to ST SNF at discharge to improve overall functional mobility.      Follow Up Recommendations  SNF;Supervision/Assistance - 24 hour     Equipment Recommendations  Other (comment) (defer to SNF)    Recommendations for Other Services       Precautions / Restrictions Precautions Precautions: Back;Fall Precaution Comments: Pt only able to recall 1/3 back precautions. Required Braces or Orthoses: Spinal Brace Spinal Brace: Lumbar corset Restrictions Weight Bearing Restrictions: No    Mobility  Bed Mobility               General bed mobility comments: Received sitting in chair upon PT arrival.  Transfers Overall transfer level: Needs assistance Equipment used: Rolling walker (2 wheeled) Transfers: Sit to/from Stand Sit to Stand: Min assist         General transfer comment: Min A to rise from chair x2 with cues for hand placement and forward lean.  Ambulation/Gait Ambulation/Gait assistance: Min assist Ambulation Distance (Feet): 40 Feet (x2 bouts) Assistive device: Rolling walker (2 wheeled) Gait Pattern/deviations: Step-through pattern;Trunk flexed;Shuffle   Gait velocity interpretation: at or above normal speed for  age/gender General Gait Details: Pt with short shuffling steps with RW too far anterior. Cues for upright posture and RW proximity. Increased knee flexion bilaterally. Seated rest break.   Stairs            Wheelchair Mobility    Modified Rankin (Stroke Patients Only)       Balance Overall balance assessment: Needs assistance Sitting-balance support: Feet supported;No upper extremity supported Sitting balance-Leahy Scale: Poor     Standing balance support: During functional activity Standing balance-Leahy Scale: Poor Standing balance comment: Tolerated standing for 3 minutes with BUE support while performing wash up and pericare.                     Cognition Arousal/Alertness: Awake/alert Behavior During Therapy: WFL for tasks assessed/performed Overall Cognitive Status: Within Functional Limits for tasks assessed       Memory: Decreased recall of precautions              Exercises      General Comments General comments (skin integrity, edema, etc.): Lengthy discussion with wife about disposition and safety concerns at home. Wife indecisive about discharge planning reporting she is worried about taking him home and not being able to "handle him there." Discussed possibility of SNF however wife reports, "I would rather take him home" however concerned about him not being able to get out of his current recliner chair.  Patient incontinent of stool resulting in need to be washed up by this therapist and tech in bathroom, changing gown and socks and performing hygiene.       Pertinent Vitals/Pain Pain Assessment: 0-10 Pain Score:  (not  rated on pain scale.) Pain Location: back at surgical site Pain Descriptors / Indicators: Sore;Aching Pain Intervention(s): Monitored during session;Limited activity within patient's tolerance;Repositioned;Patient requesting pain meds-RN notified    Home Living                      Prior Function             PT Goals (current goals can now be found in the care plan section) Progress towards PT goals: Progressing toward goals    Frequency  Min 5X/week    PT Plan Current plan remains appropriate    Co-evaluation             End of Session Equipment Utilized During Treatment: Gait belt;Back brace Activity Tolerance: Patient limited by fatigue Patient left: in chair;with call bell/phone within reach;with chair alarm set     Time: 3435-6861 PT Time Calculation (min) (ACUTE ONLY): 19 min  Charges:  $Gait Training: 8-22 mins $Therapeutic Activity: 8-22 mins $Self Care/Home Management: 8-22                    G CodesCandy Sledge A 06-29-2014, 4:12 PM  Candy Sledge, [place], DPT [phone]

## 2014-05-30 NOTE — Progress Notes (Signed)
Patient ID: Logan Russell, male   DOB: 04-23-1935, 80 y.o.   MRN: [id number] Subjective:  The patient is alert and pleasant. He feels he is too weak to go home today. I spoke with the patient's wife.  Objective: Vital signs in last 24 hours: Temp:  [98.4 F (36.9 C)-100.2 F (37.9 C)] 98.8 F (37.1 C) (01/01 1005) Pulse Rate:  [80-120] 112 (01/01 1005) Resp:  [16-20] 20 (01/01 1005) BP: (100-138)/(60-72) 102/65 mmHg (01/01 1005) SpO2:  [95 %-99 %] 98 % (01/01 1005)  Intake/Output from previous day: 12/31 0701 - 01/01 0700 In: 480 [P.O.:480] Out: 1015 [Urine:975; Drains:40] Intake/Output this shift: Total I/O In: 240 [Other:240] Out: 750 [Urine:750]  Physical exam patient is alert and oriented. He is moving his lower extremities well. His Foley catheter is in place.  Lab Results:  Recent Labs  05/27/14 1340 05/27/14 2044 05/28/14 0805  WBC 12.4*  --  13.0*  HGB 11.5* 9.7* 9.2*  HCT 36.0* 29.4* 27.8*  PLT 360  --  328   BMET  Recent Labs  05/28/14 0805 05/30/14 0550  NA 136 135  K 4.5 3.4*  CL 102 101  CO2 25 23  GLUCOSE 121* 109*  BUN 42* 37*  CREATININE 2.39* 1.83*  CALCIUM 8.9 8.6    Studies/Results: No results found.  Assessment/Plan: Cauda equina syndrome/metastatic cancer: I discussed the situation with the patient is wife. It looks like he may be a candidate for skilled nursing facility.  LOS: 3 days     Miquel Stacks D 05/30/2014, 10:55 AM

## 2014-05-30 NOTE — Clinical Social Work Psychosocial (Signed)
Clinical Social Work Department BRIEF PSYCHOSOCIAL ASSESSMENT 05/30/2014  Patient:  Logan Russell, Logan Russell     Account Number:  [id number]     Admit date:  05/27/2014  Clinical Social Worker:  Glendon Axe, CLINICAL SOCIAL WORKER  Date/Time:  05/30/2014 05:09 PM  Referred by:  Physician  Date Referred:  05/30/2014 Referred for  SNF Placement   Other Referral:   Interview type:  Family Other interview type:   CSW met with patient and pt's family present at bedside.    PSYCHOSOCIAL DATA Living Status:  WIFE Admitted from facility:   Level of care:   Primary support name:  Logan Russell Primary support relationship to patient:  SPOUSE Degree of support available:   Strong    CURRENT CONCERNS Current Concerns  Post-Acute Placement   Other Concerns:    SOCIAL WORK ASSESSMENT / PLAN Clinical Social Worker met with patient and pt's family present at bedside in reference to post-acute placement for SNF. CSW explained CSW role and SNF process. CSW also reviewed and provided SNF list. Pt and pt's family initially reported pt would like to return home with Home Health services. CSW explained Home Health vs SNF option and pt then reported he would consider SNF placement and pt's family agreed. CSW to submit pt's clinicals to SNF's. Pt requested [facility]. CSW will continue to follow pt and pt's family for continued support and to facilitate pt's discharge needs once medically stable.   Assessment/plan status:  Psychosocial Support/Ongoing Assessment of Needs Other assessment/ plan:   Information/referral to community resources:   SNF information/ list.    PATIENT'S/FAMILY'S RESPONSE TO PLAN OF CARE: Pt sitting at bedside, alert and oriented X4. Pt introduced family including wife, daughter, son and brother. Pt and pt's family reluctant in regards to SNF placement but agreeable to placement at [facility]. Pt and pt's family appreciated social work intervention.       Glendon Axe, MSW, LCSWA [phone] 05/30/2014 9:14 PM

## 2014-05-30 NOTE — Clinical Social Work Note (Signed)
Clinical Social Worker has assessed patient and pt's family. Full psychosocial assessment to follow.  Glendon Axe, MSW, [place] [phone] 05/30/2014 5:13 PM

## 2014-05-30 NOTE — Clinical Social Work Placement (Addendum)
Clinical Social Work Department CLINICAL SOCIAL WORK PLACEMENT NOTE 05/30/2014  Patient:  Logan Russell, Logan Russell  Account Number:  [id number] [place] date:  05/27/2014  Clinical Social Worker:  Glendon Axe, CLINICAL SOCIAL WORKER  Date/time:  05/30/2014 09:15 PM  Clinical Social Work is seeking post-discharge placement for this patient at the following level of care:   SKILLED NURSING   (*CSW will update this form in Epic as items are completed)   05/30/2014  Patient/family provided with [facility] Department of Clinical Social Work's list of facilities offering this level of care within the geographic area requested by the patient (or if unable, by the patient's family).  05/30/2014  Patient/family informed of their freedom to choose among providers that offer the needed level of care, that participate in Medicare, Medicaid or managed care program needed by the patient, have an available bed and are willing to accept the patient.  05/30/2014  Patient/family informed of [facility]' ownership interest in [facility], as well as of the fact that they are under no obligation to receive care at this facility.  PASARR submitted to EDS on 06/02/2014 PASARR number received on 06/02/2014  FL2 transmitted to all facilities in geographic area requested by pt/family on  06/02/2014 FL2 transmitted to all facilities within larger geographic area on 06/02/2014  Patient informed that his/her managed care company has contracts with or will negotiate with  certain facilities, including the following:  YES   Patient/family informed of bed offers received:  06/02/2014 Patient chooses bed at [place] Physician recommends and patient chooses bed at    Patient to be transferred to [facility]  on 06/03/2014 Patient to be transferred to facility by Pt's wife Patient and family notified of transfer on 06/03/2014 Name of family member notified:  Pt's wife  The following physician request were entered in Epic:   Additional Comments:   Glendon Axe, MSW, [place] [phone] 05/30/2014 9:16 PM

## 2014-05-30 NOTE — Progress Notes (Addendum)
[facility] PROGRESS NOTE  YESHAYA VATH DXA:[id number] DOB: 05-08-1935 DOA: 05/27/2014 PCP: [name], MD  Assessment/Plan: 1. S/p decompressive laminectomy- patient presented  with complaints of spinal cord compression secondary to metastatic tumor. Patient has undergone laminectomy with removal of the tumor. Neurosurgery following. 2. Non small cell lung cancer- Patient followed by the oncology as outpatient. Immunotherapy is on hold for now. 3. Constipation- Will start Miralax daily, dulcolax prn. Patient is also on Senna 1 tab po BID. 4. Bilateral hydronephrosis- abdominal ultrasound on 04/22/2014 showed bilateral hydronephrosis, patient has Foley catheter in place, will need outpatient urology evaluation after discharge. 5. Back pain- Patient is on high dose Oxycodone 20 mg po QID 6. Hypokalemia-replace potassium, check BMP in a.m. 7. Acute on CKD stage III-slowly improving, today creatinine is 1.83 baseline creatinine is 1.4-2.0. 8. DVT prophylaxis- SCD  Code Status: Full code Family Communication: *Discussed with wife, who is concerned that patient may not be strong enough to go home. They're considering skilled facility but still would like to go home with physical therapy if possible. Disposition Plan: Skilled nursing facility   Consultants:  Neurosurgery  Procedures:  Decompressive laminectomy  Antibiotics:  None  HPI/Subjective: 80 y.o. male with Past medical history of hypertension, lung cancer, radiation. The patient presented with complaints of leg weakness. Recently the patient was found to have urinary retention with bilateral hydronephrosis. For which he has seen a urologist as an outpatient and a Foley catheter was inserted and patient was started on Flomax. This was in November 2015. He was complaining of lower back pain later on and for that his PCP of pain and MRI of his lumbar spine. The MRI was showing evidence of severe metastatic tumor  infiltration at the L5 level along with L4 and S1 involvement with obliteration of the spinal canal. She was also found to have metastatic lymphadenopathy. Along with urinary retention he was also having difficulty with bowel movements. Initially the patient wasn't [place] at which time case was discussed with radiation oncology as well as neurosurgery. It was later on decided that the patient will be transferred to [facility] for neurosurgical intervention. And patient was seen after the surgery. At the time of my evaluation patient complains of some sore throat but does not have any complaint of chest pain and abdominal pain nausea vomiting fever or chills dizziness or lightheadedness or any focal deficit.  Patient feels that he is too weak to go home. Patient's wife at bedside.  Objective: Filed Vitals:   05/30/14 1005  BP: 102/65  Pulse: 112  Temp: 98.8 F (37.1 C)  Resp: 20    Intake/Output Summary (Last 24 hours) at 05/30/14 1325 Last data filed at 05/30/14 0839  Gross per 24 hour  Intake    240 ml  Output   1765 ml  Net  -1525 ml   There were no vitals filed for this visit.  Exam:  Physical Exam: Neck: Supple, no deformities, masses, or tenderness Lungs: Normal respiratory effort, bilateral clear to auscultation, no crackles or wheezes.  Heart: Regular rate and rhythm, S1 and S2 normal, no murmurs, rubs auscultated Abdomen: BS normoactive,soft,nondistended,non-tender to palpation,no organomegaly Extremities: No pretibial edema, no erythema, no cyanosis, no clubbing Neuro : Alert and oriented to time, place and person, No focal deficits   Data Reviewed: Basic Metabolic Panel:  Recent Labs Lab 05/27/14 1341 05/28/14 0805 05/30/14 0550  NA 137 136 135  K 4.1 4.5 3.4*  CL  --  102 101  CO2 22 25 23   GLUCOSE 108 121* 109*  BUN 47.2* 42* 37*  CREATININE 2.5* 2.39* 1.83*  CALCIUM 9.9 8.9 8.6   Liver Function Tests:  Recent Labs Lab  05/27/14 1341 05/28/14 0805  AST 20 17  ALT 19 15  ALKPHOS 89 66  BILITOT 0.48 0.5  PROT 7.5 6.2  ALBUMIN 3.0* 2.9*   No results for input(s): LIPASE, AMYLASE in the last 168 hours. No results for input(s): AMMONIA in the last 168 hours. CBC:  Recent Labs Lab 05/27/14 1340 05/27/14 2044 05/28/14 0805  WBC 12.4*  --  13.0*  NEUTROABS 10.8*  --  12.0*  HGB 11.5* 9.7* 9.2*  HCT 36.0* 29.4* 27.8*  MCV 92.9  --  92.4  PLT 360  --  328    Studies: No results found.  Scheduled Meds: .  ceFAZolin (ANCEF) IV  1 g Intravenous Q12H  . docusate sodium  100 mg Oral BID  . feeding supplement (ENSURE COMPLETE)  237 mL Oral BID PC  . feeding supplement (RESOURCE BREEZE)  1 Container Oral Q24H  . folic acid  1 mg Oral Daily  . multivitamin with minerals  1 tablet Oral Daily  . oxyCODONE  20 mg Oral QID  . polyethylene glycol  17 g Oral Daily  . potassium chloride SA  20 mEq Oral BID  . senna  1 tablet Oral BID  . sodium chloride  3 mL Intravenous Q12H  . tamsulosin  0.4 mg Oral BID   Continuous Infusions: . sodium chloride      Principal Problem:   Cauda equina compression Active Problems:   Lung cancer   Hypertension   Epidural mass   Urinary retention   Metastatic cancer to spine   Protein-calorie malnutrition, severe    Time spent: 25 min    [facility] Pager [phone]. If 7PM-7AM, please contact night-coverage at [email or web address], password [facility] 05/30/2014, 1:25 PM  LOS: 3 days

## 2014-05-30 NOTE — Progress Notes (Signed)
Physical Therapy Treatment Patient Details Name: Logan Russell MRN: [id number] DOB: 1934/08/20 Today's Date: 05/30/2014    History of Present Illness Patient is a 80 y/o male s/p L4-5,L5-S1 decompressive laminectomy for removal of metastatic tumor bilaterally. PMH of lung cancer and HTN. Recently the patient was found to have urinary retention with bilateral hydronephrosis. For which he has seen a urologist as an outpatient and a Foley catheter was inserted.     PT Comments    Patient more unsteady on feet today requiring Min A and cues for balance/safety. Ambulation distance limited as pt incontinent of stool resulting in need to have full wash up in bathroom. Tolerated static standing for 3-4 minutes with BUE support. Wife concerned about taking pt home. Discharge recommendation updated to [facility] after discussion with wife about safety concerns, lack of assist at home and difficulties with home environment. CM made aware of possible need for SNF referral. Will attempt second tx in PM for gait training to assess improvement/progress.    Follow Up Recommendations  SNF;Supervision/Assistance - 24 hour     Equipment Recommendations  None recommended by PT    Recommendations for Other Services       Precautions / Restrictions Precautions Precautions: Back;Fall Precaution Comments: Pt only able to recall 1/3 back precautions. Required Braces or Orthoses: Spinal Brace Spinal Brace: Lumbar corset Restrictions Weight Bearing Restrictions: No    Mobility  Bed Mobility               General bed mobility comments: Received sitting in chair upon PT arrival.  Transfers Overall transfer level: Needs assistance Equipment used: Rolling walker (2 wheeled) Transfers: Sit to/from Stand Sit to Stand: Min assist         General transfer comment: Min A to rise from chair with increased time and cues. Min guard to rise from toilet with use of grab bars. LOB upon descent onto toilet with  Min A to prevent fall. Transferred to chair after clean up in bathroom.  Ambulation/Gait Ambulation/Gait assistance: Min assist Ambulation Distance (Feet): 30 Feet Assistive device: Rolling walker (2 wheeled) Gait Pattern/deviations: Step-through pattern;Shuffle;Trunk flexed     General Gait Details: Pt with short shuffling steps with RW too far anterior. Cues for upright posture and RW proximity. Incontinent of stool resulting in need to ambulate to bathroom for wash up.   Stairs            Wheelchair Mobility    Modified Rankin (Stroke Patients Only)       Balance Overall balance assessment: Needs assistance Sitting-balance support: Feet supported;No upper extremity supported Sitting balance-Leahy Scale: Fair     Standing balance support: During functional activity Standing balance-Leahy Scale: Poor Standing balance comment: Tolerated standing for 3 minutes with BUE support while performing wash up and pericare.                     Cognition Arousal/Alertness: Awake/alert Behavior During Therapy: WFL for tasks assessed/performed Overall Cognitive Status: Within Functional Limits for tasks assessed       Memory: Decreased recall of precautions              Exercises      General Comments General comments (skin integrity, edema, etc.): Lengthy discussion with wife about disposition and safety concerns at home. Wife indecisive about discharge planning reporting she is worried about taking him home and not being able to "handle him there." Discussed possibility of SNF however wife reports, "I would  rather take him home" however concerned about him not being able to get out of his current recliner chair.  Patient incontinent of stool resulting in need to be washed up by this therapist and tech in bathroom, changing gown and socks and performing hygiene.       Pertinent Vitals/Pain Pain Assessment: 0-10 Pain Score: 3  Pain Location: back at surgical  site Pain Descriptors / Indicators: Sore;Aching Pain Intervention(s): Monitored during session;Repositioned;Premedicated before session    Home Living                      Prior Function            PT Goals (current goals can now be found in the care plan section) Progress towards PT goals: Not progressing toward goals - comment    Frequency  Min 5X/week    PT Plan Discharge plan needs to be updated    Co-evaluation             End of Session Equipment Utilized During Treatment: Gait belt;Back brace Activity Tolerance: Patient limited by fatigue Patient left: in chair;with call bell/phone within reach;with chair alarm set;with family/visitor present;with nursing/sitter in room     Time: 7408-1448 PT Time Calculation (min) (ACUTE ONLY): 44 min  Charges:  $Gait Training: 8-22 mins $Therapeutic Activity: 8-22 mins $Self Care/Home Management: 09-Feb-2023                    G Codes:      Folan, Zadaya [place] A 06-20-2014, 1:20 PM  Candy Sledge, [place], DPT [phone]

## 2014-05-30 NOTE — Progress Notes (Signed)
Pharmacy OK'd K-Dur dosage

## 2014-05-31 DIAGNOSIS — E43 Unspecified severe protein-calorie malnutrition: Secondary | ICD-10-CM

## 2014-05-31 LAB — TYPE AND SCREEN
ABO/RH(D): AB POS
Antibody Screen: NEGATIVE
Unit division: 0
Unit division: 0

## 2014-05-31 LAB — BASIC METABOLIC PANEL
ANION GAP: 8 (ref 5–15)
BUN: 36 mg/dL — AB (ref 6–23)
CALCIUM: 8.9 mg/dL (ref 8.4–10.5)
CO2: 28 mmol/L (ref 19–32)
Chloride: 100 mEq/L (ref 96–112)
Creatinine, Ser: 1.78 mg/dL — ABNORMAL HIGH (ref 0.50–1.35)
GFR calc Af Amer: 41 mL/min — ABNORMAL LOW (ref >90)
GFR calc non Af Amer: 35 mL/min — ABNORMAL LOW (ref >90)
Glucose, Bld: 108 mg/dL — ABNORMAL HIGH (ref 70–99)
POTASSIUM: 4.3 mmol/L (ref 3.5–5.1)
SODIUM: 136 mmol/L (ref 135–145)

## 2014-05-31 NOTE — Progress Notes (Signed)
PT Cancellation Note  Patient Details Name: Logan Russell MRN: [id number] DOB: 1935/02/20   Cancelled Treatment:    Reason Eval/Treat Not Completed: Other (comment). Pt stated he had walked twice today and did not "feel up to it" at this time. Will re-attempt to see pt at next available time. Pt with very dry cough, RN made aware pt is requesting "cough medicine"    Gustavus Bryant, [place] 05/31/2014, 4:20 PM

## 2014-05-31 NOTE — Progress Notes (Signed)
Occupational Therapy Treatment Patient Details Name: TORION HULGAN MRN: [id number] DOB: 1934/09/11 Today's Date: 05/31/2014    History of present illness Patient is a 80 y/o male s/p L4-5,L5-S1 decompressive laminectomy for removal of metastatic tumor bilaterally. PMH of lung cancer and HTN. Recently the patient was found to have urinary retention with bilateral hydronephrosis. For which he has seen a urologist as an outpatient and a Foley catheter was inserted.    OT comments  Pt progressing. Education provided to pt and spouse.   Follow Up Recommendations  SNF    Equipment Recommendations  3 in 1 bedside comode    Recommendations for Other Services      Precautions / Restrictions Precautions Precautions: Back;Fall Precaution Booklet Issued:  (one in room) Precaution Comments: reviewed precautions Required Braces or Orthoses: Spinal Brace Spinal Brace: Lumbar corset;Applied in sitting position (adjusted in standing) Restrictions Weight Bearing Restrictions: No       Mobility Bed Mobility Overal bed mobility: Needs Assistance Bed Mobility: Rolling;Sidelying to Sit Rolling: Supervision Sidelying to sit: Supervision       General bed mobility comments: cues for technique  Transfers Overall transfer level: Needs assistance Equipment used: Rolling walker (2 wheeled) Transfers: Sit to/from Stand Sit to Stand: Min guard         General transfer comment: cues for hand placement/technique.    Balance                                   ADL Overall ADL's : Needs assistance/impaired     Grooming: Wash/dry face;Oral care;Min guard;Standing Grooming Details (indicate cue type and reason): cues for precautions         Upper Body Dressing : Standing;Moderate assistance;Sitting Upper Body Dressing Details (indicate cue type and reason): brace Lower Body Dressing: Maximal assistance;Sit to/from stand (briefs)   Toilet Transfer: Min  guard;Ambulation;RW;BSC   Toileting- Water quality scientist and Hygiene: Sit to/from stand;Maximal assistance       Functional mobility during ADLs: Min guard;Rolling walker (cues for upright posture; adjusted walker) General ADL Comments: Explained AE is available if wanted-pt wanting wife to assist. Wife did assist OT in helping pt during session. Educated on safety (rugs/clutter/cords, safe shoewear, use of bag on walker). Educated on back brace. Educated on use of cup for oral care and placement of grooming items to avoid breaking precautions. Explained what could be used for toilet aide for hygiene-pt able to reach to buttocks for hygiene, but OT/wife assisted in cleaning thoroughly and there was dressing back there to try to avoid.  Discussed incorporating precautions into functional activities. Discussed positioning of pillows. Pt wanting to walk so ambulated some in hallway-rest break taken.      Vision                     Perception     Praxis      Cognition  Awake/Alert Behavior During Therapy: WFL for tasks assessed/performed Overall Cognitive Status:  (unsure of baseline) Area of Impairment: Memory     Memory: Decreased recall of precautions;Decreased short-term memory               Extremity/Trunk Assessment               Exercises     Shoulder Instructions       General Comments      Pertinent Vitals/ Pain       Pain  Assessment: 0-10 Pain Score: 4  Pain Location: back Pain Intervention(s): Repositioned;Monitored during session  Home Living                                          Prior Functioning/Environment              Frequency Min 2X/week     Progress Toward Goals  OT Goals(current goals can now be found in the care plan section)  Progress towards OT goals: Progressing toward goals  Acute Rehab OT Goals Patient Stated Goal: go home and get in his truck OT Goal Formulation: With patient Time For Goal  Achievement: 06/12/14 Potential to Achieve Goals: Good ADL Goals Pt Will Perform Grooming: with supervision;standing Pt Will Perform Upper Body Bathing: with supervision;sitting;standing Pt Will Perform Lower Body Bathing: with supervision;sit to/from stand Pt Will Transfer to Toilet: with supervision;bedside commode Pt Will Perform Toileting - Clothing Manipulation and hygiene: with set-up;with supervision;with caregiver independent in assisting;sit to/from stand Pt Will Perform Tub/Shower Transfer: ambulating;rolling walker;with supervision  Plan Discharge plan needs to be updated    Co-evaluation                 End of Session Equipment Utilized During Treatment: Gait belt;Rolling walker;Back brace   Activity Tolerance Patient tolerated treatment well   Patient Left in chair;with call bell/phone within reach;with family/visitor present   Nurse Communication          Time: 2103-1281 OT Time Calculation (min): 30 min  Charges: OT General Charges $OT Visit: 1 Procedure OT Treatments $Self Care/Home Management : 8-22 mins $Therapeutic Activity: 8-22 mins  Benito Mccreedy OTR/L [phone] 05/31/2014, 2:17 PM

## 2014-05-31 NOTE — Progress Notes (Signed)
[facility] PROGRESS NOTE  SHERRILL MCKAMIE TIR:[id number] DOB: 09-06-1934 DOA: 05/27/2014 PCP: [name], MD  Assessment/Plan: 1. S/p decompressive laminectomy- patient presented  with complaints of spinal cord compression secondary to metastatic tumor. Patient has undergone laminectomy with removal of the tumor. Neurosurgery following. 2. Non small cell lung cancer- Patient followed by the oncology as outpatient. Immunotherapy is on hold for now. 3. Constipation- Will start Miralax daily, dulcolax prn. Patient is also on Senna 1 tab po BID. 4. Bilateral hydronephrosis- abdominal ultrasound on 04/22/2014 showed bilateral hydronephrosis, patient has Foley catheter in place, will need outpatient urology evaluation after discharge. 5. Back pain- Patient is on high dose Oxycodone 20 mg po QID 6. Hypokalemia-resolved after replacement. 7. Acute on CKD stage III-slowly improving, today creatinine is 1.78 baseline creatinine is 1.4-2.0. 8. DVT prophylaxis- SCD  Code Status: Full code Family Communication: *Discussed with wife, who is concerned that patient may not be strong enough to go home.  Disposition Plan: Skilled nursing facility   Consultants:  Neurosurgery  Procedures:  Decompressive laminectomy  Antibiotics:  None  HPI/Subjective: 80 y.o. male with Past medical history of hypertension, lung cancer, radiation. The patient presented with complaints of leg weakness. Recently the patient was found to have urinary retention with bilateral hydronephrosis. For which he has seen a urologist as an outpatient and a Foley catheter was inserted and patient was started on Flomax. This was in November 2015. He was complaining of lower back pain later on and for that his PCP of pain and MRI of his lumbar spine. The MRI was showing evidence of severe metastatic tumor infiltration at the L5 level along with L4 and S1 involvement with obliteration of the spinal canal. She was also  found to have metastatic lymphadenopathy. Along with urinary retention he was also having difficulty with bowel movements. Initially the patient wasn't [place] at which time case was discussed with radiation oncology as well as neurosurgery. It was later on decided that the patient will be transferred to [facility] for neurosurgical intervention. And patient was seen after the surgery. At the time of my evaluation patient complains of some sore throat but does not have any complaint of chest pain and abdominal pain nausea vomiting fever or chills dizziness or lightheadedness or any focal deficit.  Patient and wife decided to go to SNF  Objective: Filed Vitals:   05/31/14 0629  BP: 110/66  Pulse: 96  Temp: 98.9 F (37.2 C)  Resp: 18    Intake/Output Summary (Last 24 hours) at 05/31/14 0848 Last data filed at 05/31/14 0500  Gross per 24 hour  Intake      0 ml  Output   1120 ml  Net  -1120 ml   Filed Weights   05/31/14 0151  Weight: 79.652 kg (175 lb 9.6 oz)    Exam:  Physical Exam: Neck: Supple, no deformities, masses, or tenderness Lungs: Normal respiratory effort, bilateral clear to auscultation, no crackles or wheezes.  Heart: Regular rate and rhythm, S1 and S2 normal, no murmurs, rubs auscultated Abdomen: BS normoactive,soft,nondistended,non-tender to palpation,no organomegaly Extremities: No pretibial edema, no erythema, no cyanosis, no clubbing Neuro : Alert and oriented to time, place and person, No focal deficits   Data Reviewed: Basic Metabolic Panel:  Recent Labs Lab 05/27/14 1341 05/28/14 0805 05/30/14 0550 05/31/14 0339  NA 137 136 135 136  K 4.1 4.5 3.4* 4.3  CL  --  102 101 100  CO2 22 25 23  28  GLUCOSE 108 121* 109* 108*  BUN 47.2* 42* 37* 36*  CREATININE 2.5* 2.39* 1.83* 1.78*  CALCIUM 9.9 8.9 8.6 8.9   Liver Function Tests:  Recent Labs Lab 05/27/14 1341 05/28/14 0805  AST 20 17  ALT 19 15  ALKPHOS 89 66   BILITOT 0.48 0.5  PROT 7.5 6.2  ALBUMIN 3.0* 2.9*   No results for input(s): LIPASE, AMYLASE in the last 168 hours. No results for input(s): AMMONIA in the last 168 hours. CBC:  Recent Labs Lab 05/27/14 1340 05/27/14 2044 05/28/14 0805  WBC 12.4*  --  13.0*  NEUTROABS 10.8*  --  12.0*  HGB 11.5* 9.7* 9.2*  HCT 36.0* 29.4* 27.8*  MCV 92.9  --  92.4  PLT 360  --  328    Studies: No results found.  Scheduled Meds: . docusate sodium  100 mg Oral BID  . feeding supplement (ENSURE COMPLETE)  237 mL Oral BID PC  . feeding supplement (RESOURCE BREEZE)  1 Container Oral Q24H  . folic acid  1 mg Oral Daily  . multivitamin with minerals  1 tablet Oral Daily  . oxyCODONE  20 mg Oral QID  . polyethylene glycol  17 g Oral Daily  . senna  1 tablet Oral BID  . sodium chloride  3 mL Intravenous Q12H  . tamsulosin  0.4 mg Oral BID   Continuous Infusions: . sodium chloride      Principal Problem:   Cauda equina compression Active Problems:   Lung cancer   Hypertension   Epidural mass   Urinary retention   Metastatic cancer to spine   Protein-calorie malnutrition, severe    Time spent: 25 min    [facility] Pager [phone]. If 7PM-7AM, please contact night-coverage at [email or web address], password [facility] 05/31/2014, 8:48 AM  LOS: 4 days

## 2014-05-31 NOTE — Progress Notes (Signed)
Subjective: Patient reports Doing very well minimal back pain no leg pain  Objective: Vital signs in last 24 hours: Temp:  [98.8 F (37.1 C)-99.3 F (37.4 C)] 98.8 F (37.1 C) (01/02 0946) Pulse Rate:  [96-122] 108 (01/02 0946) Resp:  [16-20] 16 (01/02 0946) BP: (100-128)/(66-82) 112/68 mmHg (01/02 0946) SpO2:  [95 %-100 %] 100 % (01/02 0946) Weight:  [79.652 kg (175 lb 9.6 oz)] 79.652 kg (175 lb 9.6 oz) (01/02 0151)  Intake/Output from previous day: 01/01 0701 - 01/02 0700 In: 240  Out: 1870 [Urine:1850; Drains:20] Intake/Output this shift:    Still is some difficulty mobilizing however strength is 5 out of 5 incision clean dry and intact  Lab Results: No results for input(s): WBC, HGB, HCT, PLT in the last 72 hours. BMET  Recent Labs  05/30/14 0550 05/31/14 0339  NA 135 136  K 3.4* 4.3  CL 101 100  CO2 23 28  GLUCOSE 109* 108*  BUN 37* 36*  CREATININE 1.83* 1.78*  CALCIUM 8.6 8.9    Studies/Results: No results found.  Assessment/Plan: Continue mobilizing with physical and occupational therapy agree with rehabilitation placement DC Hemovac  LOS: 4 days     Ailish Prospero P 05/31/2014, 10:23 AM

## 2014-06-01 DIAGNOSIS — E876 Hypokalemia: Secondary | ICD-10-CM

## 2014-06-01 DIAGNOSIS — N1330 Unspecified hydronephrosis: Secondary | ICD-10-CM

## 2014-06-01 NOTE — Progress Notes (Signed)
Overall doing well. States he is having no problems with his legs. Denies weakness or numbness or tingling. Complains of appropriate postoperative mild back soreness. Still has Foley catheter in place. Should it be removed for a voiding trial?

## 2014-06-01 NOTE — Progress Notes (Signed)
Physical Therapy Treatment Patient Details Name: Logan Russell MRN: [id number] DOB: 13-Apr-1935 Today's Date: 06/01/2014    History of Present Illness Patient is a 80 y/o male s/p L4-5,L5-S1 decompressive laminectomy for removal of metastatic tumor bilaterally. PMH of lung cancer and HTN. Recently the patient was found to have urinary retention with bilateral hydronephrosis. For which he has seen a urologist as an outpatient and a Foley catheter was inserted.     PT Comments    Pt slowly progressing continues to be limited by poor activity tolerance. Pt very motivated to return to independence. Cont to recommend SNF and follow per POC.   Follow Up Recommendations  SNF;Supervision/Assistance - 24 hour     Equipment Recommendations  Other (comment)    Recommendations for Other Services       Precautions / Restrictions Precautions Precautions: Back;Fall Precaution Comments: pt able to independently recall 2/3 precautions  Required Braces or Orthoses: Spinal Brace Spinal Brace: Lumbar corset;Applied in sitting position Restrictions Weight Bearing Restrictions: No    Mobility  Bed Mobility               General bed mobility comments: up in chair  Transfers Overall transfer level: Needs assistance Equipment used: Rolling walker (2 wheeled) Transfers: Sit to/from Stand Sit to Stand: Min guard         General transfer comment: cues for hand placement; min guard to steady when transferring hands to RW  Ambulation/Gait Ambulation/Gait assistance: Min guard Ambulation Distance (Feet): 90 Feet (45' x 2) Assistive device: Rolling walker (2 wheeled) Gait Pattern/deviations: Decreased stride length;Step-through pattern;Shuffle;Decreased dorsiflexion - left;Decreased dorsiflexion - right;Wide base of support;Trunk flexed Gait velocity: decreased Gait velocity interpretation: Below normal speed for age/gender General Gait Details: cues for upright posture and gt sequencing;  required sitting rest break due to "feeling off balance"; min (A) to balance and manage RW   Stairs            Wheelchair Mobility    Modified Rankin (Stroke Patients Only)       Balance Overall balance assessment: Needs assistance Sitting-balance support: Feet supported;No upper extremity supported Sitting balance-Leahy Scale: Good     Standing balance support: During functional activity;Bilateral upper extremity supported Standing balance-Leahy Scale: Poor Standing balance comment: RW to balance                     Cognition Arousal/Alertness: Awake/alert Behavior During Therapy: WFL for tasks assessed/performed Overall Cognitive Status: Within Functional Limits for tasks assessed Area of Impairment: Memory     Memory: Decreased recall of precautions;Decreased short-term memory              Exercises      General Comments        Pertinent Vitals/Pain Pain Assessment: 0-10 Pain Score: 4  Pain Location: surgical site Pain Descriptors / Indicators: Aching;Sore Pain Intervention(s): Monitored during session;Repositioned;Patient requesting pain meds-RN notified    Home Living                      Prior Function            PT Goals (current goals can now be found in the care plan section) Acute Rehab PT Goals Patient Stated Goal: to get stronger  PT Goal Formulation: With patient Time For Goal Achievement: 06/11/14 Potential to Achieve Goals: Fair Progress towards PT goals: Progressing toward goals    Frequency  Min 5X/week    PT Plan Current plan  remains appropriate    Co-evaluation             End of Session Equipment Utilized During Treatment: Gait belt;Back brace Activity Tolerance: Patient tolerated treatment well Patient left: in chair;with call bell/phone within reach;with chair alarm set     Time: 5573-2202 PT Time Calculation (min) (ACUTE ONLY): 16 min  Charges:  $Gait Training: 8-22 mins                     G CodesElie Russell [place], [place] 06/01/2014, 10:04 AM

## 2014-06-01 NOTE — Progress Notes (Addendum)
[facility] PROGRESS NOTE  Logan Russell OZH:[id number] DOB: 1934/08/01 DOA: 05/27/2014 PCP: [name], MD  Brief summary: 80 year old male with history of HTN, non-small cell lung cancer status post chemoradiation, currently on immunotherapy, recently evaluated by outpatient urology for urinary retention and had Foley catheter placed, underwent outpatient MRI spine for low back pain which showed cauda equina syndrome from metastatic cancer. Patient underwent decompressive lumbar laminectomy from L4-S1 for removal of metastatic tumor bilaterally.  Assessment/Plan: 1. S/p decompressive laminectomy- patient presented with complaints of spinal cord compression secondary to metastatic tumor. Patient has undergone decompressive laminectomy with removal of the tumor. Neurosurgery following. Patient has some stool incontinence. Foley catheter needs to stay in place until outpatient follow-up with urology/Dr. Janice Norrie. 2. Non small cell lung cancer- Patient followed by oncology/Dr. Julien Nordmann as outpatient. Immunotherapy is on hold for now. 3. Constipation- Will start Miralax daily, dulcolax prn. Patient is also on Senna 1 tab po BID. As per nursing, patient having stool incontinence which may be related to problem #1. 4. Bilateral hydronephrosis/urinary retention- abdominal ultrasound on 04/22/2014 showed bilateral hydronephrosis, patient has Foley catheter in place. Apparently evaluated by Dr. Janice Norrie, urology and has follow-up appointment next week. Will repeat renal ultrasound to see if hydronephrosis has improved/resolved. 5. Back pain- Patient is on high dose Oxycodone 20 mg po QID. Controlled 6. Hypokalemia- replaced 7. Acute on CKD stage III- baseline creatinine is 1.4-2.0. Creatinine continues to gradually improve. 8. Chronic anemia: Mild drop in hemoglobin from baseline of 11.5-9.2. No overt bleeding. Follow CBC in a.m. 9. DVT prophylaxis- SCD  Code Status: Full code Family  Communication: None at bedside. Disposition Plan: Skilled nursing facility when medically stable   Consultants:  Neurosurgery  Procedures:  Decompressive laminectomy L4-S1, on 05/27/14  Antibiotics:  None  Subjective: Mild appropriate soreness in the back. Fecal incontinence as per nursing. Patient denies any other complaints. Appears slightly confused.  Objective: Filed Vitals:   06/01/14 0106 06/01/14 0604 06/01/14 0946 06/01/14 0949  BP: 97/62 129/68 98/59 89/63   Pulse: 119 109 113 115  Temp: 99.1 F (37.3 C) 98.8 F (37.1 C) 98.6 F (37 C)   TempSrc: Oral Oral Oral   Resp: 20 22 20    Height:      Weight:      SpO2: 97% 98% 97%       Intake/Output Summary (Last 24 hours) at 06/01/14 1303 Last data filed at 06/01/14 1220  Gross per 24 hour  Intake    123 ml  Output   1200 ml  Net  -1077 ml   Filed Weights   05/31/14 0151  Weight: 79.652 kg (175 lb 9.6 oz)    Exam:  Gen. exam: Pleasant elderly male sitting comfortably on chair. Lungs: Normal respiratory effort, bilateral clear to auscultation, no crackles or wheezes.  Heart: Regular rate and rhythm, S1 and S2 normal, no murmurs, rubs auscultated. Telemetry: Sinus tachycardia 100-110's Abdomen: BS normoactive,soft,nondistended,non-tender to palpation,no organomegaly. Foley catheter +. Extremities: No pretibial edema, no erythema, no cyanosis, no clubbing. Symmetric 5 x 5 power. Neuro : Alert and oriented to time, place and person, No focal deficits   Data Reviewed: Basic Metabolic Panel:  Recent Labs Lab 05/27/14 1341 05/28/14 0805 05/30/14 0550 05/31/14 0339  NA 137 136 135 136  K 4.1 4.5 3.4* 4.3  CL  --  102 101 100  CO2 22 25 23 28   GLUCOSE 108 121* 109* 108*  BUN 47.2* 42* 37* 36*  CREATININE 2.5* 2.39* 1.83* 1.78*  CALCIUM 9.9 8.9 8.6 8.9   Liver Function Tests:  Recent Labs Lab 05/27/14 1341 05/28/14 0805  AST 20 17  ALT 19 15  ALKPHOS 89 66  BILITOT 0.48 0.5  PROT 7.5 6.2   ALBUMIN 3.0* 2.9*   No results for input(s): LIPASE, AMYLASE in the last 168 hours. No results for input(s): AMMONIA in the last 168 hours. CBC:  Recent Labs Lab 05/27/14 1340 05/27/14 2044 05/28/14 0805  WBC 12.4*  --  13.0*  NEUTROABS 10.8*  --  12.0*  HGB 11.5* 9.7* 9.2*  HCT 36.0* 29.4* 27.8*  MCV 92.9  --  92.4  PLT 360  --  328    Studies: No results found.  Scheduled Meds: . docusate sodium  100 mg Oral BID  . feeding supplement (ENSURE COMPLETE)  237 mL Oral BID PC  . feeding supplement (RESOURCE BREEZE)  1 Container Oral Q24H  . folic acid  1 mg Oral Daily  . multivitamin with minerals  1 tablet Oral Daily  . oxyCODONE  20 mg Oral QID  . polyethylene glycol  17 g Oral Daily  . senna  1 tablet Oral BID  . sodium chloride  3 mL Intravenous Q12H  . tamsulosin  0.4 mg Oral BID   Continuous Infusions: . sodium chloride      Principal Problem:   Cauda equina compression Active Problems:   Lung cancer   Hypertension   Epidural mass   Urinary retention   Metastatic cancer to spine   Protein-calorie malnutrition, severe    Time spent: 60 min   Josean Lycan, MD, FACP, FHM. [facility] Pager [phone]  If 7PM-7AM, please contact night-coverage [email or web address] Password [email or web address] 06/01/2014, 1:13 PM    LOS: 5 days

## 2014-06-02 ENCOUNTER — Inpatient Hospital Stay (HOSPITAL_COMMUNITY): Payer: Medicare Other

## 2014-06-02 DIAGNOSIS — D649 Anemia, unspecified: Secondary | ICD-10-CM | POA: Insufficient documentation

## 2014-06-02 DIAGNOSIS — N1330 Unspecified hydronephrosis: Secondary | ICD-10-CM | POA: Insufficient documentation

## 2014-06-02 LAB — CBC
HCT: 26.3 % — ABNORMAL LOW (ref 39.0–52.0)
Hemoglobin: 8.3 g/dL — ABNORMAL LOW (ref 13.0–17.0)
MCH: 29 pg (ref 26.0–34.0)
MCHC: 31.6 g/dL (ref 30.0–36.0)
MCV: 92 fL (ref 78.0–100.0)
Platelets: 318 10*3/uL (ref 150–400)
RBC: 2.86 MIL/uL — AB (ref 4.22–5.81)
RDW: 14.8 % (ref 11.5–15.5)
WBC: 10.4 10*3/uL (ref 4.0–10.5)

## 2014-06-02 LAB — BASIC METABOLIC PANEL
ANION GAP: 10 (ref 5–15)
BUN: 32 mg/dL — ABNORMAL HIGH (ref 6–23)
CALCIUM: 8.9 mg/dL (ref 8.4–10.5)
CO2: 27 mmol/L (ref 19–32)
Chloride: 99 mEq/L (ref 96–112)
Creatinine, Ser: 1.67 mg/dL — ABNORMAL HIGH (ref 0.50–1.35)
GFR calc Af Amer: 44 mL/min — ABNORMAL LOW (ref >90)
GFR calc non Af Amer: 38 mL/min — ABNORMAL LOW (ref >90)
Glucose, Bld: 110 mg/dL — ABNORMAL HIGH (ref 70–99)
Potassium: 3.3 mmol/L — ABNORMAL LOW (ref 3.5–5.1)
Sodium: 136 mmol/L (ref 135–145)

## 2014-06-02 LAB — MAGNESIUM: Magnesium: 1.6 mg/dL (ref 1.5–2.5)

## 2014-06-02 MED ORDER — POTASSIUM CHLORIDE CRYS ER 20 MEQ PO TBCR
40.0000 meq | EXTENDED_RELEASE_TABLET | Freq: Once | ORAL | Status: AC
  Administered 2014-06-02: 40 meq via ORAL
  Filled 2014-06-02: qty 2

## 2014-06-02 NOTE — Progress Notes (Signed)
Physical Therapy Treatment Patient Details Name: KEVRON PATELLA MRN: [id number] DOB: Nov 30, 1934 Today's Date: 06/02/2014    History of Present Illness Patient is a 80 y/o male s/p L4-5,L5-S1 decompressive laminectomy for removal of metastatic tumor bilaterally. PMH of lung cancer and HTN. Recently the patient was found to have urinary retention with bilateral hydronephrosis. For which he has seen a urologist as an outpatient and a Foley catheter was inserted.     PT Comments    Patient not moving as well this AM. His activity tolerance is very decreased this session. Patient is awaiting to hear back from [facility]. Will continue with current POC and increasing activity tolerance as able.   Follow Up Recommendations  SNF;Supervision/Assistance - 24 hour     Equipment Recommendations  Other (comment)    Recommendations for Other Services       Precautions / Restrictions Precautions Precautions: Back;Fall Precaution Comments: pt able to independently recall 2/3 precautions  Required Braces or Orthoses: Spinal Brace Spinal Brace: Lumbar corset;Applied in sitting position    Mobility  Bed Mobility Overal bed mobility: Needs Assistance Bed Mobility: Sit to Sidelying         Sit to sidelying: Min assist General bed mobility comments: Min A for LEs and trunk positioning back into bad. Cues to recall log roll technique  Transfers Overall transfer level: Needs assistance Equipment used: Rolling walker (2 wheeled) Transfers: Sit to/from Stand           General transfer comment: cues for hand placement; continue to require min guard to steady when transferring hands to RW  Ambulation/Gait Ambulation/Gait assistance: Min guard;Min assist Ambulation Distance (Feet): 50 Feet Assistive device: Rolling walker (2 wheeled) Gait Pattern/deviations: Decreased stride length;Step-through pattern;Shuffle;Decreased dorsiflexion - left;Trunk flexed Gait velocity: decreased Gait velocity  interpretation: Below normal speed for age/gender General Gait Details: cues and min A to help facilitate upright posture and gt sequencing; min (A) to balance and manage RW   Stairs            Wheelchair Mobility    Modified Rankin (Stroke Patients Only)       Balance                                    Cognition Arousal/Alertness: Awake/alert Behavior During Therapy: WFL for tasks assessed/performed Overall Cognitive Status: Within Functional Limits for tasks assessed       Memory: Decreased recall of precautions              Exercises      General Comments        Pertinent Vitals/Pain Pain Score: 4  Pain Location: back Pain Descriptors / Indicators: Aching;Sore Pain Intervention(s): Monitored during session    Home Living                      Prior Function            PT Goals (current goals can now be found in the care plan section) Progress towards PT goals: Progressing toward goals    Frequency  Min 5X/week    PT Plan Current plan remains appropriate    Co-evaluation             End of Session Equipment Utilized During Treatment: Gait belt;Back brace Activity Tolerance: Patient limited by fatigue Patient left: in chair;with call bell/phone within reach     Time: 3790-2409 PT  Time Calculation (min) (ACUTE ONLY): 16 min  Charges:  $Gait Training: 8-22 mins                    G Codes:      Jacqualyn Posey 06/02/2014, 9:12 AM 06/02/2014 Jacqualyn Posey PTA [phone] pager [phone] office

## 2014-06-02 NOTE — Consult Note (Signed)
WOC wound consult note Reason for Consult:  Evaluation Stage II Pressure ulcer Wound type: upon assessment this is not pressure, this is related to moisture.  Pt is incontinent of b/b and wears a brief in order to participate in therapies.  The area of concern is between the buttocks and not located on a pressure point.  Pressure Ulcer POA: No Wound bed: some superficial skin peeling, blanchable erythema  Drainage (amount, consistency, odor) none Periwound: intact  Dressing procedure/placement/frequency: Bedside nursing staff have initiated a soft silicone foam dressing which will serve to absorb moisture as long as it is placed correctly with the foam deep into the base of the gluteal cleft.  I would also suggest limiting the brief use to only when this patient is up participating with therapy to lessen the moisture trapped in this area.  Could use barrier cream if the area worsens.   Discussed POC with patient and bedside nurse.  Re consult if needed, will not follow at this time. Thanks  Tavyn Kurka [place], [place] [phone])

## 2014-06-02 NOTE — Progress Notes (Signed)
[facility] PROGRESS NOTE  Logan Russell SNK:[id number] DOB: [birth year] DOA: 05/27/2014 PCP: [name], MD  Brief summary: 80 year old male with history of HTN, non-small cell lung cancer status post chemoradiation, currently on immunotherapy, recently evaluated by outpatient urology for urinary retention and had Foley catheter placed, underwent outpatient MRI spine for low back pain which showed cauda equina syndrome from metastatic cancer. Patient underwent decompressive lumbar laminectomy from L4-S1 for removal of metastatic tumor bilaterally.  Assessment/Plan: 1. S/p decompressive laminectomy- patient presented with complaints of spinal cord compression/cauda equina syndrome secondary to metastatic tumor. Patient has undergone decompressive laminectomy with removal of the tumor. Neurosurgery following. Patient has some stool incontinence. Foley catheter needs to stay in place until outpatient follow-up with urology/Dr. Janice Norrie. 2. Non small cell lung cancer- Patient followed by oncology/Dr. Julien Nordmann as outpatient. Immunotherapy is on hold for now. Updated Dr. Julien Nordmann of patient's hospitalization on 06/02/13. 3. Constipation- Will start Miralax daily, dulcolax prn. Patient is also on Senna 1 tab po BID. As per nursing, patient having stool incontinence which may be related to problem #1. 4. Bilateral hydronephrosis/urinary retention- abdominal ultrasound on 04/22/2014 showed bilateral hydronephrosis, patient has Foley catheter in place. Apparently evaluated by Dr. Janice Norrie, urology and has follow-up appointment next week. Will repeat renal ultrasound to see if hydronephrosis has improved/resolved-pending. 5. Back pain- Patient is on high dose Oxycodone 20 mg po QID. Controlled 6. Hypokalemia- replace & follow. 7. Acute on CKD stage III- baseline creatinine is 1.4-2.0. Creatinine continues to gradually improve. 8. Chronic anemia: Mild drop in hemoglobin from baseline of 11.5>9.2>8.3. No  overt bleeding. Follow CBC in a.m and transfuse if less than 7 g per DL. Some of it may be due to acute blood loss from recent surgery. 9. DVT prophylaxis- SCD  Code Status: Full code Family Communication: None at bedside. Disposition Plan: Skilled nursing facility pending neurosurgery clearance, possibly in the next 24-48 hours.   Consultants:  Neurosurgery  Procedures:  Decompressive laminectomy L4-S1, on 05/27/14  Indwelling Foley catheter.  Antibiotics:  None  Subjective: Patient denies complaints. Back pain gradually improving. States that he is able to feel sensations during BM.   Objective: Filed Vitals:   06/01/14 2125 06/02/14 0130 06/02/14 0605 06/02/14 0927  BP: 123/76 102/67 111/66 103/67  Pulse: 109 102 106 101  Temp: 98.9 F (37.2 C) 99.6 F (37.6 C) 98.8 F (37.1 C) 99.2 F (37.3 C)  TempSrc: Oral Oral Oral Oral  Resp: 18 18 22 20   Height:      Weight:      SpO2: 98% 99% 96% 97%      Intake/Output Summary (Last 24 hours) at 06/02/14 1206 Last data filed at 06/01/14 2224  Gross per 24 hour  Intake    123 ml  Output    500 ml  Net   -377 ml   Filed Weights   05/31/14 0151  Weight: 79.652 kg (175 lb 9.6 oz)    Exam:  Gen. exam: Pleasant elderly male sitting comfortably on chair. Lungs: Normal respiratory effort, bilateral clear to auscultation, no crackles or wheezes.  Heart: Regular rate and rhythm, S1 and S2 normal, no murmurs, rubs auscultated. Abdomen: BS normoactive,soft,nondistended,non-tender to palpation,no organomegaly. Foley catheter +. Extremities: No pretibial edema, no erythema, no cyanosis, no clubbing. Symmetric 5 x 5 power. Neuro : Alert and oriented to time, place and person, No focal deficits   Data Reviewed: Basic Metabolic Panel:  Recent Labs Lab 05/27/14 1341 05/28/14 0805 05/30/14 0550 05/31/14 0339 06/02/14  0734  NA 137 136 135 136 136  K 4.1 4.5 3.4* 4.3 3.3*  CL  --  102 101 100 99  CO2 22 25 23 28 27    GLUCOSE 108 121* 109* 108* 110*  BUN 47.2* 42* 37* 36* 32*  CREATININE 2.5* 2.39* 1.83* 1.78* 1.67*  CALCIUM 9.9 8.9 8.6 8.9 8.9   Liver Function Tests:  Recent Labs Lab 05/27/14 1341 05/28/14 0805  AST 20 17  ALT 19 15  ALKPHOS 89 66  BILITOT 0.48 0.5  PROT 7.5 6.2  ALBUMIN 3.0* 2.9*   No results for input(s): LIPASE, AMYLASE in the last 168 hours. No results for input(s): AMMONIA in the last 168 hours. CBC:  Recent Labs Lab 05/27/14 1340 05/27/14 2044 05/28/14 0805 06/02/14 0734  WBC 12.4*  --  13.0* 10.4  NEUTROABS 10.8*  --  12.0*  --   HGB 11.5* 9.7* 9.2* 8.3*  HCT 36.0* 29.4* 27.8* 26.3*  MCV 92.9  --  92.4 92.0  PLT 360  --  328 318    Studies: No results found.  Scheduled Meds: . docusate sodium  100 mg Oral BID  . feeding supplement (ENSURE COMPLETE)  237 mL Oral BID PC  . feeding supplement (RESOURCE BREEZE)  1 Container Oral Q24H  . folic acid  1 mg Oral Daily  . multivitamin with minerals  1 tablet Oral Daily  . oxyCODONE  20 mg Oral QID  . polyethylene glycol  17 g Oral Daily  . senna  1 tablet Oral BID  . sodium chloride  3 mL Intravenous Q12H  . tamsulosin  0.4 mg Oral BID   Continuous Infusions: . sodium chloride      Principal Problem:   Cauda equina compression Active Problems:   Lung cancer   Hypertension   Epidural mass   Urinary retention   Metastatic cancer to spine   Protein-calorie malnutrition, severe    Time spent: 15 min   Dublin Grayer, MD, FACP, FHM. [facility] Pager [phone]  If 7PM-7AM, please contact night-coverage [email or web address] Password [email or web address] 06/02/2014, 12:06 PM    LOS: 6 days

## 2014-06-02 NOTE — Clinical Social Work Note (Signed)
Patient has a bed at [facility]. CSW will continue to follow patient and pt's family for continued support and to facilitate pt's discharge needs once medically stable.   FL-2 on chart for MD signature.   Glendon Axe, MSW, LCSWA [phone] 06/02/2014 1:00 PM

## 2014-06-03 ENCOUNTER — Telehealth: Payer: Self-pay | Admitting: Radiation Oncology

## 2014-06-03 DIAGNOSIS — N179 Acute kidney failure, unspecified: Secondary | ICD-10-CM

## 2014-06-03 DIAGNOSIS — N189 Chronic kidney disease, unspecified: Secondary | ICD-10-CM

## 2014-06-03 LAB — BASIC METABOLIC PANEL
ANION GAP: 6 (ref 5–15)
BUN: 29 mg/dL — ABNORMAL HIGH (ref 6–23)
CALCIUM: 8.4 mg/dL (ref 8.4–10.5)
CO2: 27 mmol/L (ref 19–32)
CREATININE: 1.66 mg/dL — AB (ref 0.50–1.35)
Chloride: 101 mEq/L (ref 96–112)
GFR, EST AFRICAN AMERICAN: 44 mL/min — AB (ref >90)
GFR, EST NON AFRICAN AMERICAN: 38 mL/min — AB (ref >90)
Glucose, Bld: 112 mg/dL — ABNORMAL HIGH (ref 70–99)
Potassium: 3.6 mmol/L (ref 3.5–5.1)
SODIUM: 134 mmol/L — AB (ref 135–145)

## 2014-06-03 LAB — CBC
HCT: 25.1 % — ABNORMAL LOW (ref 39.0–52.0)
Hemoglobin: 8.5 g/dL — ABNORMAL LOW (ref 13.0–17.0)
MCH: 31.6 pg (ref 26.0–34.0)
MCHC: 33.9 g/dL (ref 30.0–36.0)
MCV: 93.3 fL (ref 78.0–100.0)
PLATELETS: 277 10*3/uL (ref 150–400)
RBC: 2.69 MIL/uL — AB (ref 4.22–5.81)
RDW: 14.7 % (ref 11.5–15.5)
WBC: 9.4 10*3/uL (ref 4.0–10.5)

## 2014-06-03 MED ORDER — ENSURE COMPLETE PO LIQD: 237.0000 mL | Freq: Two times a day (BID) | ORAL | Status: DC

## 2014-06-03 MED ORDER — BOOST / RESOURCE BREEZE PO LIQD: 1.0000 | ORAL | Status: DC

## 2014-06-03 MED ORDER — GUAIFENESIN ER 600 MG PO TB12: 1200.0000 mg | ORAL_TABLET | Freq: Two times a day (BID) | ORAL | Status: AC | PRN

## 2014-06-03 MED ORDER — BISACODYL 10 MG RE SUPP: 10.0000 mg | Freq: Every day | RECTAL | Status: AC | PRN

## 2014-06-03 MED ORDER — SENNA 8.6 MG PO TABS: 1.0000 | ORAL_TABLET | Freq: Every day | ORAL | Status: DC | PRN

## 2014-06-03 MED ORDER — POLYETHYLENE GLYCOL 3350 17 G PO PACK: 17.0000 g | PACK | Freq: Every day | ORAL | Status: DC | PRN

## 2014-06-03 MED ORDER — ACETAMINOPHEN 325 MG PO TABS: 650.0000 mg | ORAL_TABLET | ORAL | Status: AC | PRN

## 2014-06-03 MED ORDER — POLYETHYLENE GLYCOL 3350 17 G PO PACK: 17.0000 g | PACK | Freq: Every day | ORAL | Status: AC | PRN

## 2014-06-03 MED ORDER — OXYCODONE HCL 20 MG PO TABS: 20.0000 mg | ORAL_TABLET | Freq: Four times a day (QID) | ORAL | Status: DC | PRN

## 2014-06-03 MED ORDER — SENNA 8.6 MG PO TABS: 1.0000 | ORAL_TABLET | Freq: Every day | ORAL | Status: AC | PRN

## 2014-06-03 MED ORDER — BISACODYL 10 MG RE SUPP: 10.0000 mg | Freq: Every day | RECTAL | Status: DC | PRN

## 2014-06-03 NOTE — Telephone Encounter (Signed)
Called a second time reference upcoming appt.

## 2014-06-03 NOTE — Progress Notes (Signed)
Occupational Therapy Treatment Patient Details Name: Logan Russell MRN: [id number] DOB: 1935-03-08 Today's Date: 06/03/2014    History of present illness Patient is a 80 y/o male s/p L4-5,L5-S1 decompressive laminectomy for removal of metastatic tumor bilaterally. PMH of lung cancer and HTN. Recently the patient was found to have urinary retention with bilateral hydronephrosis. For which he has seen a urologist as an outpatient and a Foley catheter was inserted.    OT comments  Pt. Able to return demo of use of A/E for LB dressing.  Interested in continued use for assistance with LB ADLS.  Eager for d/c to snf today for continued therapies.    Follow Up Recommendations  SNF    Equipment Recommendations  3 in 1 bedside comode    Recommendations for Other Services      Precautions / Restrictions Precautions Precautions: Back;Fall Precaution Comments: pt able to independently recall 2/3 precautions  Required Braces or Orthoses: Spinal Brace Spinal Brace: Lumbar corset;Applied in sitting position Restrictions Weight Bearing Restrictions: No       Mobility Bed Mobility                  Transfers                      Balance                                   ADL Overall ADL's : Needs assistance/impaired             Lower Body Bathing: Set up;Sitting/lateral leans;Cueing for compensatory techniques;Cueing for back precautions Lower Body Bathing Details (indicate cue type and reason): able to return demo of use of LH sponge     Lower Body Dressing: Set up;Sitting/lateral leans;Cueing for compensatory techniques;Cueing for back precautions;Minimal assistance Lower Body Dressing Details (indicate cue type and reason): able to return demo of use of reacher and sock aide, min a to use reacher to don pants               General ADL Comments: pt. interested in a/e requested to practice multiple times      Vision                      Perception     Praxis      Cognition   Behavior During Therapy: Flat affect Overall Cognitive Status: Within Functional Limits for tasks assessed                       Extremity/Trunk Assessment               Exercises     Shoulder Instructions       General Comments      Pertinent Vitals/ Pain       Pain Assessment: 0-10 Pain Score: 4  Pain Location: back Pain Descriptors / Indicators: Aching Pain Intervention(s): Monitored during session  Home Living                                          Prior Functioning/Environment              Frequency Min 2X/week     Progress Toward Goals  OT Goals(current goals can now be found in the care  plan section)  Progress towards OT goals: Progressing toward goals     Plan Discharge plan remains appropriate    Co-evaluation                 End of Session Equipment Utilized During Treatment: Other (comment) (A/E)   Activity Tolerance Patient tolerated treatment well   Patient Left in chair;with call bell/phone within reach   Nurse Communication          Time: 3832-9191 OT Time Calculation (min): 14 min  Charges: OT General Charges $OT Visit: 1 Procedure OT Treatments $Self Care/Home Management : 8-22 mins  Janice Coffin, COTA/L 06/03/2014, 9:04 AM

## 2014-06-03 NOTE — Telephone Encounter (Signed)
Phoned patient to inform him of appointment with Dr. Tammi Klippel on 06/12/2013 at 0830. No answer. Left message requesting return call.

## 2014-06-03 NOTE — Clinical Social Work Note (Addendum)
Clinical Social Worker facilitated patient discharge including contacting patient family and facility to confirm patient discharge plans.  Clinical information faxed to facility and family agreeable with plan.  Patient's wife will transport patient to [facility].  RN to call report prior to discharge.  DC packet prepared and given to pt's wife.   Clinical Social Worker will sign off for now as social work intervention is no longer needed. Please consult [place] again if new need arises.  Glendon Axe, MSW, LCSWA [phone] 06/03/2014 1:12 PM

## 2014-06-03 NOTE — Progress Notes (Signed)
Report was given to Logan Russell at [facility]. Discharge packages were given to patient with all discharge instruictions

## 2014-06-03 NOTE — Progress Notes (Signed)
Physical Therapy Treatment Patient Details Name: KWAMANE WHACK MRN: [id number] DOB: 1935/02/07 Today's Date: 06/03/2014    History of Present Illness Patient is a 80 y/o male s/p L4-5,L5-S1 decompressive laminectomy for removal of metastatic tumor bilaterally. PMH of lung cancer and HTN. Recently the patient was found to have urinary retention with bilateral hydronephrosis. For which he has seen a urologist as an outpatient and a Foley catheter was inserted.     PT Comments    Patient appears to have more energy today and is making some better progress. He does continue to fatigue quickly. He is hopeful that he will DC to [facility] today  Follow Up Recommendations  SNF;Supervision/Assistance - 24 hour     Equipment Recommendations       Recommendations for Other Services       Precautions / Restrictions Precautions Precautions: Back;Fall Precaution Comments: pt able to independently recall 2/3 precautions. Cues for no arching Required Braces or Orthoses: Spinal Brace Spinal Brace: Lumbar corset;Applied in sitting position Restrictions Weight Bearing Restrictions: No    Mobility  Bed Mobility             Sit to sidelying: Min assist General bed mobility comments: Min A for LEs back into bed  Transfers Overall transfer level: Needs assistance Equipment used: Rolling walker (2 wheeled) Transfers: Sit to/from Stand           General transfer comment: cues for hand placement; continue to require min guard to steady when transferring hands to RW but much steadier than previous sessoin  Ambulation/Gait Ambulation/Gait assistance: Min guard Ambulation Distance (Feet): 90 Feet Assistive device: Rolling walker (2 wheeled) Gait Pattern/deviations: Step-through pattern;Decreased stride length Gait velocity: decreased Gait velocity interpretation: Below normal speed for age/gender General Gait Details: Cues for upright posture and positioning within RW   Stairs             Wheelchair Mobility    Modified Rankin (Stroke Patients Only)       Balance                                    Cognition Arousal/Alertness: Awake/alert Behavior During Therapy: Flat affect Overall Cognitive Status: Within Functional Limits for tasks assessed                      Exercises      General Comments        Pertinent Vitals/Pain Pain Assessment: 0-10 Pain Score: 4  Pain Location: back Pain Descriptors / Indicators: Aching Pain Intervention(s): Monitored during session    Home Living                      Prior Function            PT Goals (current goals can now be found in the care plan section) Progress towards PT goals: Progressing toward goals    Frequency  Min 5X/week    PT Plan Current plan remains appropriate    Co-evaluation             End of Session Equipment Utilized During Treatment: Gait belt;Back brace Activity Tolerance: Patient tolerated treatment well Patient left: in bed;with call bell/phone within reach     Time: 0920-0944 PT Time Calculation (min) (ACUTE ONLY): 24 min  Charges:  $Gait Training: 8-22 mins $Therapeutic Activity: 8-22 mins  G Codes:      Jacqualyn Posey 06/03/2014, 11:24 AM 06/03/2014 Jacqualyn Posey PTA [phone] pager [phone] office

## 2014-06-03 NOTE — Discharge Summary (Signed)
Physician Discharge Summary  Logan Russell OJJ:[id number] DOB: May 12, 1935 DOA: 05/27/2014  PCP: [name], MD  Admit date: 05/27/2014 Discharge date: 06/03/2014  Time spent: greater than 30 minutes  Recommendations for Outpatient Follow-up:  1. Dr. Kary Kos, Neurosurgery in 2 weeks for postoperative follow-up. SNF to arrange appointment. 2. Dr. Tyler Pita, Radiation Oncology on 06/12/13 at 29 AM. 3. Dr. Beulah Gandy, Urology: SNF to arrange for appointment to follow-up in one week. 4. Keep indwelling Foley catheter until follow-up with urology. 5. M.D. at SNF in 3-5 days with repeat labs (CBC & BMP) 6. Dr. Elliot Dally, Medical Oncology: SNF to arrange for follow-up appointment in one week.  Discharge Diagnoses:  Principal Problem:   Cauda equina compression Active Problems:   Lung cancer   Hypertension   Epidural mass   Urinary retention   Metastatic cancer to spine   Protein-calorie malnutrition, severe   Bilateral hydronephrosis   Absolute anemia   Discharge Condition: Improved & Stable  Diet recommendation: Heart healthy diet.  Filed Weights   05/31/14 0151  Weight: 79.652 kg (175 lb 9.6 oz)    History of present illness:  80 year old male with history of HTN, non-small cell lung cancer status post chemoradiation, currently on immunotherapy, recently evaluated by outpatient urology for urinary retention and had Foley catheter placed, underwent outpatient MRI spine for low back pain which showed cauda equina syndrome from metastatic cancer. Patient underwent decompressive lumbar laminectomy from L4-S1 for removal of metastatic tumor bilaterally.  Hospital Course:    Cauda equina syndrome S/p decompressive laminectomy- patient presented with complaints of spinal cord compression/cauda equina syndrome secondary to metastatic tumor. Patient has undergone decompressive laminectomy with removal of the tumor. Neurosurgery following. Patient has some stool  incontinence. Foley catheter needs to stay in place until outpatient follow-up with urology/Dr. Janice Norrie. Discussed with Dr. Saintclair Halsted today and he indicated that patient had some stool incontinence prior to admission. Some of it could be related to the cauda equina syndrome and may take a while to resolve. He also has had indwelling Foley catheter for a couple of weeks. Dr. Saintclair Halsted cleared patient for discharge to SNF and follow-up with him in the office in a couple of weeks.  Non small cell lung cancer- Patient followed by oncology/Dr. Julien Nordmann as outpatient. Immunotherapy is on hold for now. Updated Dr. Julien Nordmann of patient's hospitalization on 06/02/13. Patient to follow-up with medical and radiation oncology as outpatient.  Constipation/diarrhea- patient has been having stool incontinence with soft/watery stool at times. Will change stool softener/laxative to when necessary.   Bilateral hydronephrosis/urinary retention- abdominal ultrasound on 04/22/2014 showed bilateral hydronephrosis, patient has Foley catheter in place. Apparently evaluated by Dr. Janice Norrie, urology and has follow-up appointment next week. Repeat renal ultrasound shows resolution of hydronephrosis. No urinary symptoms or clinical picture suggesting UTI. Urine culture in November was negative.  Back pain- Patient is on high dose Oxycodone 20 mg po QID when necessary as outpatient which will be continued. Controlled  Hypokalemia- replaced  Acute on CKD stage III- baseline creatinine is 1.4-2.0. Creatinine continues to gradually improve. Follow-up BMP at SNF closely. Unclear as to why he is on Lasix and thiazide diuretics. Due to current acute kidney injury, will keep them on hold. Patient is clinically euvolemic.  Chronic anemia: Mild drop in hemoglobin from baseline of 11.5>9.2>8.3. No overt bleeding. Some of it may be due to acute blood loss from recent surgery. Hemoglobin stable compared to yesterday. Follow CBC closely as  outpatient.   Consultants:  Neurosurgery  Procedures:  Decompressive laminectomy L4-S1, on 05/27/14  Indwelling Foley catheter.    Discharge Exam:  Complaints:  Back pain continues to improve. Complained of 3 episodes of watery stool incontinence over the last 24 hours. Does have some sensation prior to stooling but unable to control. Denies any other complaints. As per nursing, no acute events.  Filed Vitals:   06/02/14 2142 06/03/14 0259 06/03/14 0538 06/03/14 1014  BP: 127/78 114/64 109/61 123/68  Pulse: 111 102 96 94  Temp: 98.6 F (37 C) 98.2 F (36.8 C) 98.4 F (36.9 C) 98.1 F (36.7 C)  TempSrc: Oral Oral Oral Oral  Resp: 20 20 18 18   Height:      Weight:      SpO2: 99% 95% 97% 100%     Gen. exam: Pleasant elderly male sitting comfortably in bed.  Lungs: Normal respiratory effort, bilateral clear to auscultation, no crackles or wheezes.  Heart: Regular rate and rhythm, S1 and S2 normal, no murmurs, rubs auscultated. Telemetry: Sinus rhythm in the 90s-sinus tachycardia in the 100s.  Abdomen: BS normoactive,soft,nondistended,non-tender to palpation,no organomegaly. Foley catheter +.  Extremities: No pretibial edema, no erythema, no cyanosis, no clubbing. Symmetric 5 x 5 power.  Neuro : Alert and oriented to time, place and person, No focal deficits  Discharge Instructions      Discharge Instructions    Call MD for:  redness, tenderness, or signs of infection (pain, swelling, redness, odor or green/yellow discharge around incision site)    Complete by:  As directed      Call MD for:  severe uncontrolled pain    Complete by:  As directed      Call MD for:  temperature >100.4    Complete by:  As directed      Diet - low sodium heart healthy    Complete by:  As directed      Discharge instructions    Complete by:  As directed   Keep indwelling Foley catheter until out patient follow up with Urology/Dr. Janice Norrie.     Discharge wound care:    Complete by:   As directed   Keep steri strips at back surgery site, until follow up with Neurosurgery/Dr. Kary Kos. Keep area dry and clean.     Increase activity slowly    Complete by:  As directed             Medication List    STOP taking these medications        dexamethasone 4 MG tablet  Commonly known as:  DECADRON     furosemide 80 MG tablet  Commonly known as:  LASIX     potassium chloride SA 20 MEQ tablet  Commonly known as:  K-DUR,KLOR-CON     triamterene-hydrochlorothiazide 75-50 MG per tablet  Commonly known as:  MAXZIDE      TAKE these medications        acetaminophen 325 MG tablet  Commonly known as:  TYLENOL  Take 2 tablets (650 mg total) by mouth every 4 (four) hours as needed for mild pain or fever (temp > 100.5).     bisacodyl 10 MG suppository  Commonly known as:  DULCOLAX  Place 1 suppository (10 mg total) rectally daily as needed for severe constipation.     feeding supplement (ENSURE COMPLETE) Liqd  Take 237 mLs by mouth 2 (two) times daily after a meal.     feeding supplement (RESOURCE BREEZE) Liqd  Take 1 Container by mouth daily.  folic acid 1 MG tablet  Commonly known as:  FOLVITE  Take 1 tablet (1 mg total) by mouth daily.     guaiFENesin 600 MG 12 hr tablet  Commonly known as:  MUCINEX  Take 2 tablets (1,200 mg total) by mouth 2 (two) times daily as needed for cough or to loosen phlegm.     Oxycodone HCl 20 MG Tabs  Take 1 tablet (20 mg total) by mouth every 6 (six) hours as needed (for moderate - severe pain.).     polyethylene glycol packet  Commonly known as:  MIRALAX / GLYCOLAX  Take 17 g by mouth daily as needed for moderate constipation.     senna 8.6 MG Tabs tablet  Commonly known as:  SENOKOT  Take 1 tablet (8.6 mg total) by mouth daily as needed for mild constipation.     tamsulosin 0.4 MG Caps capsule  Commonly known as:  FLOMAX  Take 0.4 mg by mouth 2 (two) times daily.          The results of significant diagnostics  from this hospitalization (including imaging, microbiology, ancillary and laboratory) are listed below for reference.    Significant Diagnostic Studies: Ct Chest Wo Contrast  05/21/2014   CLINICAL DATA:  80 year old male with history of lung cancer status post chemotherapy now complete. Ongoing immunotherapy. Cough and weakness.  EXAM: CT CHEST WITHOUT CONTRAST  TECHNIQUE: Multidetector CT imaging of the chest was performed following the standard protocol without IV contrast.  COMPARISON:  CT of the chest, abdomen and pelvis 03/07/2014.  FINDINGS: Mediastinum: Again noted is bulky nodal tissue in the right perihilar region, which encases and mildly narrows the bronchus intermedius, and encases the right lower lobe bronchus and right middle lobe bronchus (which is severely narrowed and nearly completely occluded), as well as the right upper lobe bronchus. Subcarinal adenopathy is similar to the prior study measuring approximately 1.4 cm in short axis. Heart size is normal. Small amount of pericardial fluid and/or thickening, unlikely to be of hemodynamic significance at this time. No associated pericardial calcification. There is atherosclerosis of the thoracic aorta, the great vessels of the mediastinum and the coronary arteries, including calcified atherosclerotic plaque in the left main, left anterior descending, left circumflex and right coronary arteries. Esophagus is unremarkable in appearance.  Lungs/Pleura: Chronic areas of postradiation mass-like fibrosis are again noted in the left lower lobe and perihilar aspect of the right lung. Previously noted nodule in the periphery of the right lower lobe appears slightly smaller than the prior examination measuring 6 x 5 mm on today's study (previously 9 x 7 mm). No other definite new suspicious appearing pulmonary nodules or masses are noted. No acute consolidative airspace disease. No pleural effusions.  Upper Abdomen: Calcified gallstones in the neck of the  gallbladder. No current findings to suggest acute cholecystitis at this time. 12 mm low attenuation lesion in segment 2 of the liver is incompletely characterized, but similar prior studies, favored to represent a cyst. Visualized portions of the appendix are normal. Atherosclerosis in the thoracic aorta. Incompletely visualized low-attenuation lesion in the upper pole of the left kidney is similar to prior studies, favored to represent a cyst (although technically incompletely characterized on today's examination).  Musculoskeletal: There are no aggressive appearing lytic or blastic lesions noted in the visualized portions of the skeleton.  IMPRESSION: 1. Today's examination demonstrates stable findings in the thorax, as detailed above. No definite signs of progression of disease or new metastatic disease identified. 2.  Atherosclerosis, including left main and 3 vessel coronary artery disease. 3. Additional incidental findings, as above.   Electronically Signed   By: Vinnie Langton M.D.   On: 05/21/2014 13:05   Mr Lumbar Spine Wo Contrast  05/25/2014   ADDENDUM REPORT: 05/25/2014 17:01  ADDENDUM: Additional message was left on the after hours voice mail regarding this exam at 1100 hr today. As of the time of this addendum, no call back was received.  This report will be called to the ordering clinician or representative when the office opens on Monday 05/26/2014 by the Radiologist Assistant, and communication documented in the PACS or zVision Dashboard.   Electronically Signed   By: Lars Pinks M.D.   On: 05/25/2014 17:01   05/24/2014   ADDENDUM REPORT: 05/24/2014 18:37  ADDENDUM: Attempts were made to reach Dr. Claris Gower or the provider on call beginning at 1726 hrs. At the time of this addendum, I have received no return call. Additional attempts will be made to call this report on 05/25/2014.   Electronically Signed   By: Lars Pinks M.D.   On: 05/24/2014 18:37   05/25/2014   CLINICAL DATA:   80 year old male with pain in numbness from the waist to the feet, now unable to walk. Lung Cancer diagnosed 2 years ago. Subsequent encounter.  EXAM: MRI LUMBAR SPINE WITHOUT CONTRAST  TECHNIQUE: Multiplanar, multisequence MR imaging of the lumbar spine was performed. No intravenous contrast was administered.  COMPARISON:  CT Abdomen and Pelvis 03/07/2014 and earlier, including PET-CT 04/23/2013.  FINDINGS: Loss of normal bone marrow signal in association with extraosseous extension of tumor throughout the L5 level. The vertebral body and posterior elements appear diffusely infiltrated. There is associated bulky epidural, bilateral neural foraminal, and paraspinal soft tissue tumor at this level obliterating the spinal canal throughout the L5 vertebral level, and both L5 neural foramina.  There is also abnormal marrow signal throughout the lower L4 vertebral body. There is tumor replacement of the left L4 pedicle, with epidural tumor associated (series 6, image 20). The left L4 neural foramen is severely affected. These changes contribute to underlying moderate to severe degenerative L4 level spinal stenosis.  There is abnormal marrow signal in the S1 posterior elements and to a lesser extent right sacral ala. The sacral spinal canal and S1 neural foramina are not affected at this time.  There is prevertebral lymphadenopathy, in association with more generalized retroperitoneal lymphadenopathy in the abdomen.  There is fluid in the L4-L5 and L5-S1 disc spaces, and there is abnormal signal in the medial psoas muscles (mostly at the L4 vertebral level), but the appearance is not suggestive of spinal infection.  There is superimposed degenerative lumbar spinal stenosis which is multifactorial, and moderate at L3-L4.  Visualized lower thoracic spinal cord is normal with conus medularis at T12.  IMPRESSION: 1. Severe metastatic tumor infiltration of the L5 level. Left L4 and superior S1 levels affected to a lesser  extent. 2. Subsequent obliteration of the spinal canal and both neural foramina at L5. Epidural tumor at the L4 level on the left exacerbating underlying degenerative moderate to severe spinal and left L4 foraminal stenosis. 3. Metastatic prevertebral and retroperitoneal lymphadenopathy.  Electronically Signed: By: Lars Pinks M.D. On: 05/24/2014 17:29   Dg Lumbar Spine 1 View  05/27/2014   CLINICAL DATA:  L5 lumbar laminectomy.  EXAM: LUMBAR SPINE - 1 VIEW  COMPARISON:  05/24/2014  FINDINGS: A single intraoperative lateral view of the lumbar spine is submitted postoperatively  for interpretation.  A posterior metallic probe overlies the L5 spinous process directed towards the L4-5 interspace.  IMPRESSION: Localizer.   Electronically Signed   By: Hassan Rowan M.D.   On: 05/27/2014 19:18   US Renal  06/02/2014   CLINICAL DATA:  Followup bilateral hydronephrosis.  Lung carcinoma.  EXAM: RENAL/URINARY TRACT ULTRASOUND COMPLETE  COMPARISON:  04/22/2014  FINDINGS: Right Kidney:  Length: 11.5 cm. Echogenicity within normal limits. No mass or hydronephrosis visualized.  Left Kidney:  Length: 11.2 cm. Echogenicity within normal limits. No mass or hydronephrosis visualized.  Bladder:  Foley catheter seen in place. Mild diffuse bladder wall thickening may be due to cystitis or incompletely distended state a bladder.  IMPRESSION: Resolution of bilateral hydronephrosis since prior study.  Mild diffuse bladder wall thickening, which may be due to underdistention or cystitis. Suggest correlation with urinalysis.   Electronically Signed   By: Earle Gell M.D.   On: 06/02/2014 13:29    Microbiology: No results found for this or any previous visit (from the past 240 hour(s)).   Labs: Basic Metabolic Panel:  Recent Labs Lab 05/28/14 0805 05/30/14 0550 05/31/14 0339 06/02/14 0734 06/03/14 0356  NA 136 135 136 136 134*  K 4.5 3.4* 4.3 3.3* 3.6  CL 102 101 100 99 101  CO2 25 23 28 27 27   GLUCOSE 121* 109* 108* 110*  112*  BUN 42* 37* 36* 32* 29*  CREATININE 2.39* 1.83* 1.78* 1.67* 1.66*  CALCIUM 8.9 8.6 8.9 8.9 8.4  MG  --   --   --  1.6  --    Liver Function Tests:  Recent Labs Lab 05/27/14 1341 05/28/14 0805  AST 20 17  ALT 19 15  ALKPHOS 89 66  BILITOT 0.48 0.5  PROT 7.5 6.2  ALBUMIN 3.0* 2.9*   No results for input(s): LIPASE, AMYLASE in the last 168 hours. No results for input(s): AMMONIA in the last 168 hours. CBC:  Recent Labs Lab 05/27/14 1340 05/27/14 2044 05/28/14 0805 06/02/14 0734 06/03/14 0356  WBC 12.4*  --  13.0* 10.4 9.4  NEUTROABS 10.8*  --  12.0*  --   --   HGB 11.5* 9.7* 9.2* 8.3* 8.5*  HCT 36.0* 29.4* 27.8* 26.3* 25.1*  MCV 92.9  --  92.4 92.0 93.3  PLT 360  --  328 318 277   Cardiac Enzymes: No results for input(s): CKTOTAL, CKMB, CKMBINDEX, TROPONINI in the last 168 hours. BNP: BNP (last 3 results) No results for input(s): PROBNP in the last 8760 hours. CBG: No results for input(s): GLUCAP in the last 168 hours.   Left message for spouse.  Signed:  Vernell Leep, MD, FACP, FHM. [facility] Pager [phone]  If 7PM-7AM, please contact night-coverage [email or web address] Password [email or web address] 06/03/2014, 12:40 PM

## 2014-06-04 NOTE — Telephone Encounter (Signed)
Opened in error

## 2014-06-05 ENCOUNTER — Telehealth: Payer: Self-pay | Admitting: Internal Medicine

## 2014-06-05 NOTE — Telephone Encounter (Signed)
[name] form hospital to sched pt for appt...done....she will advised pt

## 2014-06-09 ENCOUNTER — Encounter: Payer: Self-pay | Admitting: Physician Assistant

## 2014-06-09 ENCOUNTER — Ambulatory Visit (HOSPITAL_BASED_OUTPATIENT_CLINIC_OR_DEPARTMENT_OTHER): Payer: Medicare Other | Admitting: Physician Assistant

## 2014-06-09 VITALS — BP 118/72 | HR 119 | Temp 97.6°F | Resp 24 | Ht 65.0 in | Wt 179.1 lb

## 2014-06-09 DIAGNOSIS — C7951 Secondary malignant neoplasm of bone: Secondary | ICD-10-CM

## 2014-06-09 DIAGNOSIS — C3411 Malignant neoplasm of upper lobe, right bronchus or lung: Secondary | ICD-10-CM

## 2014-06-09 DIAGNOSIS — C3432 Malignant neoplasm of lower lobe, left bronchus or lung: Secondary | ICD-10-CM

## 2014-06-09 NOTE — Progress Notes (Addendum)
[facility] [phone]   [phone]  OFFICE PROGRESS NOTE  [name], MD [address] [place] [address]  DIAGNOSIS: Metastatic non-small cell lung cancer initially diagnosed as Stage IIIB (T2a., N3, M0) non-small cell lung cancer consistent with invasive adenocarcinoma with negative ALK gene translocation and negative EGFR mutation diagnosed in October of 2013   PRIOR THERAPY:  1) Concurrent chemoradiation with weekly carboplatin for AUC of 2 and paclitaxel 45 mg/M2, last dose was given 06/11/2012 with partial response.  2) Consolidation chemotherapy with carboplatin for AUC of 5 and Alimta 500 mg/M2 every 3 weeks. Status post 3 cycles last dose was given 09/12/2012 with stable disease.  3) Systemic chemotherapy again with carboplatin for AUC of 5 and Alimta 500 mg/M2 every 3 weeks. First cycle expected on 11/13/2013. Status post 3 cycles, discontinued secondary to disease progression. Carboplatin was discontinued starting from cycle #2 secondary to hypersensitivity reaction.  CURRENT THERAPY: Immunotherapy with single agent Nivolumab 3 MG/TG every 2 weeks status post 8 cycles.   CHEMOTHERAPY INTENT: Palliative  CURRENT # OF CHEMOTHERAPY CYCLES: 8  CURRENT ANTIEMETICS: N/A  CURRENT SMOKING STATUS: Former Smoker  ORAL CHEMOTHERAPY AND CONSENT: None  CURRENT BISPHOSPHONATES USE: None  PAIN MANAGEMENT: 0/10  NARCOTICS INDUCED CONSTIPATION: None  LIVING WILL AND CODE STATUS: no code Blue  INTERVAL HISTORY: Logan Russell 80 y.o. male returns to the clinic today for follow-up visit accompanied by his wife. He is status post admission for emergent surgery for decompression laminectomy and tumor debulking due to cauda equina syndrome from his metastatic cancer. He is currently admitted to [facility] for rehabilitation after his surgery. He reports after his surgical procedure he had significant improvement in  his low back pain and lower extremity numbness. He now, just has pain from his surgical procedure. While he was admitted he had a follow-up renal ultrasound which revealed resolution of the bilateral hydronephrosis compared to the prior study. The patient's wife tells me that he will have a trial of removal of his indwelling Foley catheter later this week. Currently the patient is not interested in pursuing any chemotherapy or immunotherapy treatment at this time for his non-small cell lung cancer. He otherwise is feeling well, denied any fever, chills, cough, hemoptysis. Denied any significant shortness of breath except with exertion. He reports he has an appointment with Dr. Kathrynn Running on 06/12/2013 to discuss the role of radiation therapy.  MEDICAL HISTORY: Past Medical History  Diagnosis Date  . Hypertension   . History of radiation therapy 05/02/12-06/19/12    left lung mediastinal lymph nodes 66Gy/11fx  . Lung cancer 04/12/2012    ALLERGIES:  has No Known Allergies.  MEDICATIONS:  Current Outpatient Prescriptions  Medication Sig Dispense Refill  . bisacodyl (DULCOLAX) 10 MG suppository Place 1 suppository (10 mg total) rectally daily as needed for severe constipation.    . feeding supplement, RESOURCE BREEZE, (RESOURCE BREEZE) LIQD Take 1 Container by mouth daily.    . folic acid (FOLVITE) 1 MG tablet Take 1 tablet (1 mg total) by mouth daily. 30 tablet 4  . guaiFENesin (MUCINEX) 600 MG 12 hr tablet Take 2 tablets (1,200 mg total) by mouth 2 (two) times daily as needed for cough or to loosen phlegm.    . Oxycodone HCl 20 MG TABS Take 1 tablet (20 mg total) by mouth every 6 (six) hours as needed (for moderate - severe pain.). 20 tablet 0  . polyethylene  glycol (MIRALAX / GLYCOLAX) packet Take 17 g by mouth daily as needed for moderate constipation.    . promethazine (PHENERGAN) 25 MG tablet Take 25 mg by mouth every 6 (six) hours as needed for nausea or vomiting.    . senna (SENOKOT) 8.6 MG  TABS tablet Take 1 tablet (8.6 mg total) by mouth daily as needed for mild constipation.    . tamsulosin (FLOMAX) 0.4 MG CAPS capsule Take 0.4 mg by mouth 2 (two) times daily.    . vitamin C (ASCORBIC ACID) 500 MG tablet Take 500 mg by mouth daily.    Marland Kitchen zinc sulfate 220 MG capsule Take 220 mg by mouth daily.    Marland Kitchen acetaminophen (TYLENOL) 325 MG tablet Take 2 tablets (650 mg total) by mouth every 4 (four) hours as needed for mild pain or fever (temp > 100.5). (Patient not taking: Reported on 06/09/2014)    . feeding supplement, ENSURE COMPLETE, (ENSURE COMPLETE) LIQD Take 237 mLs by mouth 2 (two) times daily after a meal. (Patient not taking: Reported on 06/09/2014)     No current facility-administered medications for this visit.    REVIEW OF SYSTEMS:  Constitutional: positive for fatigue Eyes: negative Ears, nose, mouth, throat, and face: negative Respiratory: positive for dyspnea on exertion Cardiovascular: negative Gastrointestinal: negative Genitourinary:positive for patient has a foley catheter in place  Integument/breast: negative Hematologic/lymphatic: negative Musculoskeletal:positive for back pain and muscle weakness Neurological: positive for gait problems and weakness Behavioral/Psych: negative Endocrine: negative Allergic/Immunologic: negative   PHYSICAL EXAMINATION: General appearance: alert, cooperative and no distress Head: Normocephalic, without obvious abnormality, atraumatic Neck: no adenopathy Lymph nodes: Cervical, supraclavicular, and axillary nodes normal. Resp: clear to auscultation bilaterally Cardio: regular rate and rhythm, S1, S2 normal, no murmur, click, rub or gallop GI: soft, non-tender; bowel sounds normal; no masses,  no organomegaly Extremities: extremities normal, atraumatic, no cyanosis or edema Patient able to lift both lower extremities independently without assistance, strength 3/5 bilaterally  ECOG PERFORMANCE STATUS: 1 - Symptomatic but  completely ambulatory  Blood pressure 118/72, pulse 119, temperature 97.6 F (36.4 C), temperature source Oral, resp. rate 24, height $RemoveBe'5\' 5"'ZjBlBXpcp$  (1.651 m), weight 179 lb 1.6 oz (81.239 kg), SpO2 99 %.  LABORATORY DATA: Lab Results  Component Value Date   WBC 9.4 06/03/2014   HGB 8.5* 06/03/2014   HCT 25.1* 06/03/2014   MCV 93.3 06/03/2014   PLT 277 06/03/2014      Chemistry      Component Value Date/Time   NA 134* 06/03/2014 0356   NA 137 05/27/2014 1341   K 3.6 06/03/2014 0356   K 4.1 05/27/2014 1341   CL 101 06/03/2014 0356   CL 106 10/08/2012 1028   CO2 27 06/03/2014 0356   CO2 22 05/27/2014 1341   BUN 29* 06/03/2014 0356   BUN 47.2* 05/27/2014 1341   CREATININE 1.66* 06/03/2014 0356   CREATININE 2.5* 05/27/2014 1341      Component Value Date/Time   CALCIUM 8.4 06/03/2014 0356   CALCIUM 9.9 05/27/2014 1341   ALKPHOS 66 05/28/2014 0805   ALKPHOS 89 05/27/2014 1341   AST 17 05/28/2014 0805   AST 20 05/27/2014 1341   ALT 15 05/28/2014 0805   ALT 19 05/27/2014 1341   BILITOT 0.5 05/28/2014 0805   BILITOT 0.48 05/27/2014 1341       RADIOGRAPHIC STUDIES: Ct Chest Wo Contrast  05/21/2014   CLINICAL DATA:  80 year old male with history of lung cancer status post chemotherapy now complete. Ongoing immunotherapy.  Cough and weakness.  EXAM: CT CHEST WITHOUT CONTRAST  TECHNIQUE: Multidetector CT imaging of the chest was performed following the standard protocol without IV contrast.  COMPARISON:  CT of the chest, abdomen and pelvis 03/07/2014.  FINDINGS: Mediastinum: Again noted is bulky nodal tissue in the right perihilar region, which encases and mildly narrows the bronchus intermedius, and encases the right lower lobe bronchus and right middle lobe bronchus (which is severely narrowed and nearly completely occluded), as well as the right upper lobe bronchus. Subcarinal adenopathy is similar to the prior study measuring approximately 1.4 cm in short axis. Heart size is normal.  Small amount of pericardial fluid and/or thickening, unlikely to be of hemodynamic significance at this time. No associated pericardial calcification. There is atherosclerosis of the thoracic aorta, the great vessels of the mediastinum and the coronary arteries, including calcified atherosclerotic plaque in the left main, left anterior descending, left circumflex and right coronary arteries. Esophagus is unremarkable in appearance.  Lungs/Pleura: Chronic areas of postradiation mass-like fibrosis are again noted in the left lower lobe and perihilar aspect of the right lung. Previously noted nodule in the periphery of the right lower lobe appears slightly smaller than the prior examination measuring 6 x 5 mm on today's study (previously 9 x 7 mm). No other definite new suspicious appearing pulmonary nodules or masses are noted. No acute consolidative airspace disease. No pleural effusions.  Upper Abdomen: Calcified gallstones in the neck of the gallbladder. No current findings to suggest acute cholecystitis at this time. 12 mm low attenuation lesion in segment 2 of the liver is incompletely characterized, but similar prior studies, favored to represent a cyst. Visualized portions of the appendix are normal. Atherosclerosis in the thoracic aorta. Incompletely visualized low-attenuation lesion in the upper pole of the left kidney is similar to prior studies, favored to represent a cyst (although technically incompletely characterized on today's examination).  Musculoskeletal: There are no aggressive appearing lytic or blastic lesions noted in the visualized portions of the skeleton.  IMPRESSION: 1. Today's examination demonstrates stable findings in the thorax, as detailed above. No definite signs of progression of disease or new metastatic disease identified. 2. Atherosclerosis, including left main and 3 vessel coronary artery disease. 3. Additional incidental findings, as above.   Electronically Signed   By: Vinnie Langton M.D.   On: 05/21/2014 13:05   Mr Lumbar Spine Wo Contrast  05/25/2014   ADDENDUM REPORT: 05/25/2014 17:01  ADDENDUM: Additional message was left on the after hours voice mail regarding this exam at 1100 hr today. As of the time of this addendum, no call back was received.  This report will be called to the ordering clinician or representative when the office opens on Monday 05/26/2014 by the Radiologist Assistant, and communication documented in the PACS or zVision Dashboard.   Electronically Signed   By: Lars Pinks M.D.   On: 05/25/2014 17:01   05/24/2014   ADDENDUM REPORT: 05/24/2014 18:37  ADDENDUM: Attempts were made to reach Dr. Claris Gower or the provider on call beginning at 1726 hrs. At the time of this addendum, I have received no return call. Additional attempts will be made to call this report on 05/25/2014.   Electronically Signed   By: Lars Pinks M.D.   On: 05/24/2014 18:37   05/25/2014   CLINICAL DATA:  80 year old male with pain in numbness from the waist to the feet, now unable to walk. Lung Cancer diagnosed 2 years ago. Subsequent encounter.  EXAM: MRI  LUMBAR SPINE WITHOUT CONTRAST  TECHNIQUE: Multiplanar, multisequence MR imaging of the lumbar spine was performed. No intravenous contrast was administered.  COMPARISON:  CT Abdomen and Pelvis 03/07/2014 and earlier, including PET-CT 04/23/2013.  FINDINGS: Loss of normal bone marrow signal in association with extraosseous extension of tumor throughout the L5 level. The vertebral body and posterior elements appear diffusely infiltrated. There is associated bulky epidural, bilateral neural foraminal, and paraspinal soft tissue tumor at this level obliterating the spinal canal throughout the L5 vertebral level, and both L5 neural foramina.  There is also abnormal marrow signal throughout the lower L4 vertebral body. There is tumor replacement of the left L4 pedicle, with epidural tumor associated (series 6, image 20). The left L4 neural  foramen is severely affected. These changes contribute to underlying moderate to severe degenerative L4 level spinal stenosis.  There is abnormal marrow signal in the S1 posterior elements and to a lesser extent right sacral ala. The sacral spinal canal and S1 neural foramina are not affected at this time.  There is prevertebral lymphadenopathy, in association with more generalized retroperitoneal lymphadenopathy in the abdomen.  There is fluid in the L4-L5 and L5-S1 disc spaces, and there is abnormal signal in the medial psoas muscles (mostly at the L4 vertebral level), but the appearance is not suggestive of spinal infection.  There is superimposed degenerative lumbar spinal stenosis which is multifactorial, and moderate at L3-L4.  Visualized lower thoracic spinal cord is normal with conus medularis at T12.  IMPRESSION: 1. Severe metastatic tumor infiltration of the L5 level. Left L4 and superior S1 levels affected to a lesser extent. 2. Subsequent obliteration of the spinal canal and both neural foramina at L5. Epidural tumor at the L4 level on the left exacerbating underlying degenerative moderate to severe spinal and left L4 foraminal stenosis. 3. Metastatic prevertebral and retroperitoneal lymphadenopathy.  Electronically Signed: By: Lars Pinks M.D. On: 05/24/2014 17:29   Dg Lumbar Spine 1 View  05/27/2014   CLINICAL DATA:  L5 lumbar laminectomy.  EXAM: LUMBAR SPINE - 1 VIEW  COMPARISON:  05/24/2014  FINDINGS: A single intraoperative lateral view of the lumbar spine is submitted postoperatively for interpretation.  A posterior metallic probe overlies the L5 spinous process directed towards the L4-5 interspace.  IMPRESSION: Localizer.   Electronically Signed   By: Hassan Rowan M.D.   On: 05/27/2014 19:18   US Renal  06/02/2014   CLINICAL DATA:  Followup bilateral hydronephrosis.  Lung carcinoma.  EXAM: RENAL/URINARY TRACT ULTRASOUND COMPLETE  COMPARISON:  04/22/2014  FINDINGS: Right Kidney:  Length: 11.5 cm.  Echogenicity within normal limits. No mass or hydronephrosis visualized.  Left Kidney:  Length: 11.2 cm. Echogenicity within normal limits. No mass or hydronephrosis visualized.  Bladder:  Foley catheter seen in place. Mild diffuse bladder wall thickening may be due to cystitis or incompletely distended state a bladder.  IMPRESSION: Resolution of bilateral hydronephrosis since prior study.  Mild diffuse bladder wall thickening, which may be due to underdistention or cystitis. Suggest correlation with urinalysis.   Electronically Signed   By: Earle Gell M.D.   On: 06/02/2014 13:29   ASSESSMENT AND PLAN: this is a very pleasant 80 years old white male with history of stage IIIB non-small cell lung cancer status post concurrent chemoradiation followed by consolidation chemotherapy. He had evidence for disease recurrence and the patient was started on systemic chemotherapy with carboplatin and Alimta discontinued secondary to disease progression. He was most recently treated with immunotherapy with single agent  Nivolumab status post 8 cycles. He tolerated his treatment fairly well with no significant adverse effects. The patient does not desire further treatment with immunotherapy or chemotherapy at this time. Patient was discussed with and also seen by Dr. Julien Nordmann. Patient is status post decompressive laminectomy with removal of tumor due to the caudate quantity syndrome from his metastatic cancer. Patient was advised to follow with Dr. Tammi Klippel regarding the role of possible radiation therapy. He will remain on observation and follow-up in 2 months with a restaging CT scan of the chest, abdomen and pelvis without contrast to reevaluate his disease. The patient and his wife are in agreement with this plan.  He was advised to call immediately if he has any concerning symptoms in the interval. The patient voices understanding of current disease status and treatment options and is in agreement with the current care  plan.  All questions were answered. The patient knows to call the clinic with any problems, questions or concerns. We can certainly see the patient much sooner if necessary.  Carlton Adam, PA-C 06/09/2014  ADDENDUM: Hematology/Oncology Attending:  I had a face to face encounter with the patient. I recommended his care plan. This is a very pleasant 80 years old white male with metastatic non-small cell lung cancer with recent bone metastasis involving tumor infiltration of the L5 level and to a lesser extent left L4 and superior S1 with epidural tumor at the L4 level. He underwent emergent decompressive lumbar laminectomies from L4-S1 for removal of the metastatic tumor. The patient is feeling much better and he has improvement in the strength of the lower extremities. He is currently a resident at a rehabilitation facility. He is undergoing physical therapy. She still has Foley's catheter for the urinary retention that this is expected to be removed in the next few days. The patient has been on treatment with immunotherapy with Nivolumab which was discontinued after his metastatic disease to the bone. He is not interested in resuming any systemic therapy at this point. I will arrange for the patient a follow-up appointment in 2 months with repeat CT scan of the chest, abdomen and pelvis for restaging of his disease. He was advised to call immediately if he has any concerning symptoms in the interval.  Disclaimer: This note was dictated with voice recognition software. Similar sounding words can inadvertently be transcribed and may be missed upon review. Eilleen Kempf., MD 06/10/2014

## 2014-06-10 ENCOUNTER — Telehealth: Payer: Self-pay | Admitting: Internal Medicine

## 2014-06-10 NOTE — Patient Instructions (Signed)
Keep your scheduled appointment with Dr. Tammi Klippel regarding possible radiation therapy Follow-up in 2 months with a restaging CT scan of your chest, abdomen and pelvis to reevaluate your disease Follow-up with Dr. Janice Norrie as needed

## 2014-06-10 NOTE — Telephone Encounter (Signed)
, °

## 2014-06-11 ENCOUNTER — Encounter: Payer: Self-pay | Admitting: Radiation Oncology

## 2014-06-11 NOTE — Progress Notes (Signed)
Histology and Location of Primary Cancer: stage IIIB non small cell lung cancer consistent with invasive adenocarcinoma   Sites of Visceral and Bony Metastatic Disease: L4-S1  Location(s) of Symptomatic Metastases: L5  Past/Anticipated chemotherapy by medical oncology, if any:   PRIOR THERAPY:  1) Concurrent chemoradiation with weekly carboplatin for AUC of 2 and paclitaxel 45 mg/M2, last dose was given 06/11/2012 with partial response.  2) Consolidation chemotherapy with carboplatin for AUC of 5 and Alimta 500 mg/M2 every 3 weeks. Status post 3 cycles last dose was given 09/12/2012 with stable disease.  3) Systemic chemotherapy again with carboplatin for AUC of 5 and Alimta 500 mg/M2 every 3 weeks. First cycle expected on 11/13/2013. Status post 3 cycles, discontinued secondary to disease progression. Carboplatin was discontinued starting from cycle #2 secondary to hypersensitivity reaction.  CURRENT THERAPY: Immunotherapy with single agent Nivolumab 3 MG/TG every 2 weeks status post 8 cycles.   Pain on a scale of 0-10 is: surgical procedure has significantly improved his low back pain and lower extremity numbness. Now he just reports pain at his incision site.    If Spine Met(s), symptoms, if any, include:  Bowel/Bladder retention or incontinence (please describe): indwelling catheter reports; patient to perform voiding trials later this week at his urologist office  Numbness or weakness in extremities (please describe): greatly improved following laminectomy  Current Decadron regimen, if applicable: discontinued at discharge  Ambulatory status? Walker? Wheelchair?: Ambulatory with Assistance; able to life both lower extremities independently without assistance but, remains weak  SAFETY ISSUES:  Prior radiation? yes  Pacemaker/ICD? no  Possible current pregnancy? no  Is the patient on methotrexate? no  Current Complaints / other details:  80 year old male. Married. Had  decompressive laminectomy L4-S1 on 05/27/2014.

## 2014-06-11 NOTE — Progress Notes (Signed)
  Radiation Oncology         [phone] ________________________________  Name: Logan Russell  MRN: [id number]  Date: 06/12/2014  DOB: 1934-07-11  SIMULATION AND TREATMENT PLANNING NOTE    ICD-9-CM ICD-10-CM   1. Metastatic cancer to spine 198.5 C79.51     DIAGNOSIS:  80 year old gentleman with L5 lumbar spinal metastasis from adenocarcinoma the right upper lung  NARRATIVE:  The patient was brought to the [facility].  Identity was confirmed.  All relevant records and images related to the planned course of therapy were reviewed.  The patient freely provided informed written consent to proceed with treatment after reviewing the details related to the planned course of therapy. The consent form was witnessed and verified by the simulation staff.  Then, the patient was set-up in a stable reproducible  supine position for radiation therapy.  CT images were obtained.  Surface markings were placed.  The CT images were loaded into the planning software.  Then the target and avoidance structures were contoured.  Treatment planning then occurred.  The radiation prescription was entered and confirmed.  Then, I designed and supervised the construction of a total of 3 medically necessary complex treatment devices, which included a body fix immobilization bag and to multileaf collimators to shape radiation around the target volume from the anterior and posterior gantry positions avoiding the kidneys.  I have requested : 3D Simulation  I have requested a DVH of the following structures: Right kidney, left kidney, and target volume.   PLAN:  The patient will receive 35 Gy in 14 fractions.  ________________________________  Sheral Apley Tammi Klippel, M.D.

## 2014-06-12 ENCOUNTER — Encounter: Payer: Self-pay | Admitting: Radiation Oncology

## 2014-06-12 ENCOUNTER — Telehealth: Payer: Self-pay | Admitting: Radiation Oncology

## 2014-06-12 ENCOUNTER — Ambulatory Visit
Admission: RE | Admit: 2014-06-12 | Discharge: 2014-06-12 | Disposition: A | Discharge status: Home / Self Care | Payer: Medicare Other | Source: Ambulatory Visit | Attending: Radiation Oncology | Admitting: Radiation Oncology

## 2014-06-12 VITALS — BP 142/82 | HR 109 | Resp 18 | Ht 66.5 in | Wt 174.0 lb

## 2014-06-12 DIAGNOSIS — C7951 Secondary malignant neoplasm of bone: Secondary | ICD-10-CM

## 2014-06-12 DIAGNOSIS — Z9221 Personal history of antineoplastic chemotherapy: Secondary | ICD-10-CM | POA: Diagnosis not present

## 2014-06-12 DIAGNOSIS — Z923 Personal history of irradiation: Secondary | ICD-10-CM | POA: Diagnosis not present

## 2014-06-12 DIAGNOSIS — Z510 Encounter for antineoplastic radiation therapy: Secondary | ICD-10-CM | POA: Diagnosis present

## 2014-06-12 DIAGNOSIS — C3411 Malignant neoplasm of upper lobe, right bronchus or lung: Secondary | ICD-10-CM | POA: Diagnosis not present

## 2014-06-12 DIAGNOSIS — R32 Unspecified urinary incontinence: Secondary | ICD-10-CM | POA: Diagnosis not present

## 2014-06-12 DIAGNOSIS — R159 Full incontinence of feces: Secondary | ICD-10-CM | POA: Diagnosis not present

## 2014-06-12 NOTE — Telephone Encounter (Signed)
Spoke with Caryl Pina, RN at [facility]. Explained that a dime size decubitus was noted on the patient's right inner buttock. She states, "we will check this when he gets back and take care of it." She expressed appreciation for the call.

## 2014-06-12 NOTE — Progress Notes (Signed)
Patient here to discuss radiation treatment to spine following surgery. Patient reports spine pain and numbness has greatly improved following surgery. Reports pain 2 on a scale of 0-10 at surgical site. Patient denies taking pain medication to relieve this pain because it is tolerable. Reports pain medication was causing the constipation to be worse. Reports he is incontinent of bowel and bladder. Foley catheter set to gravity drain noted. Reports painless bladder spasms. Vitals stable. Resident of [facility]. Ambulating with aid of rolling walker. Scheduled to follow up with surgeon later today. Denies headache or dizziness. Reports he often vomits following meals. Patient only taking flomax and vitamins. Denies taking bp medications.

## 2014-06-12 NOTE — Progress Notes (Signed)
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 Metastatic cancer to spine 198.5 C79.51    Narrative:  The patient returns today for routine follow-up.  Patient here to discuss radiation treatment to spine following surgery. Patient reports spine pain and numbness has greatly improved following surgery. Reports pain 2 on a scale of 0-10 at surgical site. Patient denies taking pain medication to relieve this pain because it is tolerable. Reports pain medication was causing the constipation to be worse. Reports he is incontinent of bowel and bladder. Foley catheter set to gravity drain noted. Reports painless bladder spasms. Vitals stable. Resident of [facility] home. Ambulating with aid of rolling walker. Scheduled to follow up with surgeon later today. Denies headache or dizziness. Reports he often vomits following meals. Patient only taking flomax and vitamins. Denies taking bp medications.                              ALLERGIES:  has No Known Allergies.  Meds: Current Outpatient Prescriptions  Medication Sig Dispense Refill  . folic acid (FOLVITE) 1 MG tablet Take 1 tablet (1 mg total) by mouth daily. 30 tablet 4  . tamsulosin (FLOMAX) 0.4 MG CAPS capsule Take 0.4 mg by mouth 2 (two) times daily.    . vitamin C (ASCORBIC ACID) 500 MG tablet Take 500 mg by mouth daily.    Marland Kitchen zinc sulfate 220 MG capsule Take 220 mg by mouth daily.    Marland Kitchen acetaminophen (TYLENOL) 325 MG tablet Take 2 tablets (650 mg  total) by mouth every 4 (four) hours as needed for mild pain or fever (temp > 100.5). (Patient not taking: Reported on 06/09/2014)    . bisacodyl (DULCOLAX) 10 MG suppository Place 1 suppository (10 mg total) rectally daily as needed for severe constipation. (Patient not taking: Reported on 06/12/2014)    . feeding supplement, ENSURE COMPLETE, (ENSURE COMPLETE) LIQD Take 237 mLs by mouth 2 (two) times daily after a meal. (Patient not taking: Reported on 06/09/2014)    . feeding supplement, RESOURCE BREEZE, (RESOURCE BREEZE) LIQD Take 1 Container by mouth daily. (Patient not taking: Reported on 06/12/2014)    . guaiFENesin (MUCINEX) 600 MG 12 hr tablet Take 2 tablets (1,200 mg total) by mouth 2 (two) times daily as needed for cough or to loosen phlegm. (Patient not taking: Reported on 06/12/2014)    . Oxycodone HCl 20 MG TABS Take 1 tablet (20 mg total) by mouth every 6 (six) hours as needed (for moderate - severe pain.). (Patient not taking: Reported on 06/12/2014) 20 tablet 0  . polyethylene glycol (MIRALAX / GLYCOLAX) packet Take 17 g by mouth daily as needed for moderate constipation. (Patient not taking: Reported on 06/12/2014)    . promethazine (PHENERGAN) 25 MG tablet Take 25 mg by mouth every 6 (six) hours as needed for nausea or vomiting.    . senna (SENOKOT) 8.6 MG TABS tablet Take 1 tablet (8.6 mg  total) by mouth daily as needed for mild constipation. (Patient not taking: Reported on 06/12/2014)     No current facility-administered medications for this encounter.    Physical Findings: The patient is in no acute distress. Patient is alert and oriented.  height is 5' 6.5" (1.689 m) and weight is 174 lb (78.926 kg). His blood pressure is 142/82 and his pulse is 109. His respiration is 18. .  No significant changes.  Lab Findings: Lab Results  Component Value Date   WBC 9.4 06/03/2014   HGB 8.5* 06/03/2014   HCT 25.1* 06/03/2014   MCV 93.3 06/03/2014   PLT 277 06/03/2014     @LASTCHEM @  Radiographic Findings: Ct Chest Wo Contrast  05/21/2014   CLINICAL DATA:  80 year old male with history of lung cancer status post chemotherapy now complete. Ongoing immunotherapy. Cough and weakness.  EXAM: CT CHEST WITHOUT CONTRAST  TECHNIQUE: Multidetector CT imaging of the chest was performed following the standard protocol without IV contrast.  COMPARISON:  CT of the chest, abdomen and pelvis 03/07/2014.  FINDINGS: Mediastinum: Again noted is bulky nodal tissue in the right perihilar region, which encases and mildly narrows the bronchus intermedius, and encases the right lower lobe bronchus and right middle lobe bronchus (which is severely narrowed and nearly completely occluded), as well as the right upper lobe bronchus. Subcarinal adenopathy is similar to the prior study measuring approximately 1.4 cm in short axis. Heart size is normal. Small amount of pericardial fluid and/or thickening, unlikely to be of hemodynamic significance at this time. No associated pericardial calcification. There is atherosclerosis of the thoracic aorta, the great vessels of the mediastinum and the coronary arteries, including calcified atherosclerotic plaque in the left main, left anterior descending, left circumflex and right coronary arteries. Esophagus is unremarkable in appearance.  Lungs/Pleura: Chronic areas of postradiation mass-like fibrosis are again noted in the left lower lobe and perihilar aspect of the right lung. Previously noted nodule in the periphery of the right lower lobe appears slightly smaller than the prior examination measuring 6 x 5 mm on today's study (previously 9 x 7 mm). No other definite new suspicious appearing pulmonary nodules or masses are noted. No acute consolidative airspace disease. No pleural effusions.  Upper Abdomen: Calcified gallstones in the neck of the gallbladder. No current findings to suggest acute cholecystitis at this time. 12 mm low attenuation lesion in  segment 2 of the liver is incompletely characterized, but similar prior studies, favored to represent a cyst. Visualized portions of the appendix are normal. Atherosclerosis in the thoracic aorta. Incompletely visualized low-attenuation lesion in the upper pole of the left kidney is similar to prior studies, favored to represent a cyst (although technically incompletely characterized on today's examination).  Musculoskeletal: There are no aggressive appearing lytic or blastic lesions noted in the visualized portions of the skeleton.  IMPRESSION: 1. Today's examination demonstrates stable findings in the thorax, as detailed above. No definite signs of progression of disease or new metastatic disease identified. 2. Atherosclerosis, including left main and 3 vessel coronary artery disease. 3. Additional incidental findings, as above.   Electronically Signed   By: Vinnie Langton M.D.   On: 05/21/2014 13:05   Mr Lumbar Spine Wo Contrast  05/25/2014   ADDENDUM REPORT: 05/25/2014 17:01  ADDENDUM: Additional message was left on the after hours voice mail regarding this exam at 1100 hr today. As of the time of this addendum, no call back was received.  This report will be called to the  ordering clinician or representative when the office opens on Monday 05/26/2014 by the Radiologist Assistant, and communication documented in the PACS or zVision Dashboard.   Electronically Signed   By: Lars Pinks M.D.   On: 05/25/2014 17:01   05/24/2014   ADDENDUM REPORT: 05/24/2014 18:37  ADDENDUM: Attempts were made to reach Dr. Claris Gower or the provider on call beginning at 1726 hrs. At the time of this addendum, I have received no return call. Additional attempts will be made to call this report on 05/25/2014.   Electronically Signed   By: Lars Pinks M.D.   On: 05/24/2014 18:37   05/25/2014   CLINICAL DATA:  80 year old male with pain in numbness from the waist to the feet, now unable to walk. Lung Cancer diagnosed 2 years  ago. Subsequent encounter.  EXAM: MRI LUMBAR SPINE WITHOUT CONTRAST  TECHNIQUE: Multiplanar, multisequence MR imaging of the lumbar spine was performed. No intravenous contrast was administered.  COMPARISON:  CT Abdomen and Pelvis 03/07/2014 and earlier, including PET-CT 04/23/2013.  FINDINGS: Loss of normal bone marrow signal in association with extraosseous extension of tumor throughout the L5 level. The vertebral body and posterior elements appear diffusely infiltrated. There is associated bulky epidural, bilateral neural foraminal, and paraspinal soft tissue tumor at this level obliterating the spinal canal throughout the L5 vertebral level, and both L5 neural foramina.  There is also abnormal marrow signal throughout the lower L4 vertebral body. There is tumor replacement of the left L4 pedicle, with epidural tumor associated (series 6, image 20). The left L4 neural foramen is severely affected. These changes contribute to underlying moderate to severe degenerative L4 level spinal stenosis.  There is abnormal marrow signal in the S1 posterior elements and to a lesser extent right sacral ala. The sacral spinal canal and S1 neural foramina are not affected at this time.  There is prevertebral lymphadenopathy, in association with more generalized retroperitoneal lymphadenopathy in the abdomen.  There is fluid in the L4-L5 and L5-S1 disc spaces, and there is abnormal signal in the medial psoas muscles (mostly at the L4 vertebral level), but the appearance is not suggestive of spinal infection.  There is superimposed degenerative lumbar spinal stenosis which is multifactorial, and moderate at L3-L4.  Visualized lower thoracic spinal cord is normal with conus medularis at T12.  IMPRESSION: 1. Severe metastatic tumor infiltration of the L5 level. Left L4 and superior S1 levels affected to a lesser extent. 2. Subsequent obliteration of the spinal canal and both neural foramina at L5. Epidural tumor at the L4 level on  the left exacerbating underlying degenerative moderate to severe spinal and left L4 foraminal stenosis. 3. Metastatic prevertebral and retroperitoneal lymphadenopathy.  Electronically Signed: By: Lars Pinks M.D. On: 05/24/2014 17:29   Dg Lumbar Spine 1 View  05/27/2014   CLINICAL DATA:  L5 lumbar laminectomy.  EXAM: LUMBAR SPINE - 1 VIEW  COMPARISON:  05/24/2014  FINDINGS: A single intraoperative lateral view of the lumbar spine is submitted postoperatively for interpretation.  A posterior metallic probe overlies the L5 spinous process directed towards the L4-5 interspace.  IMPRESSION: Localizer.   Electronically Signed   By: Hassan Rowan M.D.   On: 05/27/2014 19:18   US Renal  06/02/2014   CLINICAL DATA:  Followup bilateral hydronephrosis.  Lung carcinoma.  EXAM: RENAL/URINARY TRACT ULTRASOUND COMPLETE  COMPARISON:  04/22/2014  FINDINGS: Right Kidney:  Length: 11.5 cm. Echogenicity within normal limits. No mass or hydronephrosis visualized.  Left Kidney:  Length: 11.2  cm. Echogenicity within normal limits. No mass or hydronephrosis visualized.  Bladder:  Foley catheter seen in place. Mild diffuse bladder wall thickening may be due to cystitis or incompletely distended state a bladder.  IMPRESSION: Resolution of bilateral hydronephrosis since prior study.  Mild diffuse bladder wall thickening, which may be due to underdistention or cystitis. Suggest correlation with urinalysis.   Electronically Signed   By: Earle Gell M.D.   On: 06/02/2014 13:29    Impression:  The patient has a metastatic deposit at L5 from lung cancer  Plan:  The patient would benefit from palliative radiotherapy to L5.  Today, I talked to the patient and family about the findings and work-up thus far.  We discussed the natural history of spinal metastases and general treatment, highlighting the role of radiotherapy in the management.  We discussed the available radiation techniques, and focused on the details of logistics and delivery.  We  reviewed the anticipated acute and late sequelae associated with radiation in this setting.  The patient was encouraged to ask questions that I answered to the best of my ability.  I filled out a patient counseling form during our discussion including treatment diagrams.  We retained a copy for our records.  The patient would like to proceed with radiation and will be scheduled for CT simulation.  I spent 60 minutes minutes face to face with the patient and more than 50% of that time was spent in counseling and/or coordination of care.   _____________________________________  Sheral Apley. Tammi Klippel, M.D.

## 2014-06-12 NOTE — Addendum Note (Signed)
Encounter addended by: Heywood Footman, RN on: 06/12/2014  3:22 PM<BR>     Documentation filed: Inpatient Document Flowsheet, Inpatient Patient Education, Charges VN

## 2014-06-12 NOTE — Progress Notes (Signed)
See progress note under physician encounter. 

## 2014-06-13 DIAGNOSIS — Z510 Encounter for antineoplastic radiation therapy: Secondary | ICD-10-CM | POA: Diagnosis not present

## 2014-06-18 ENCOUNTER — Telehealth: Payer: Self-pay | Admitting: *Deleted

## 2014-06-18 ENCOUNTER — Ambulatory Visit
Admission: RE | Admit: 2014-06-18 | Discharge: 2014-06-18 | Disposition: A | Discharge status: Home / Self Care | Payer: Medicare Other | Source: Ambulatory Visit | Attending: Radiation Oncology | Admitting: Radiation Oncology

## 2014-06-18 DIAGNOSIS — Z510 Encounter for antineoplastic radiation therapy: Secondary | ICD-10-CM | POA: Diagnosis not present

## 2014-06-18 NOTE — Telephone Encounter (Signed)
Per Dr Vista Mink, pt is currently on observation with Dr Vista Mink so any PT or treatments need to go through a family MD.  [place].  She will contact Dr Tammi Klippel.

## 2014-06-18 NOTE — Telephone Encounter (Signed)
Call in to [facility] from [place] and D/C planner.  Return call number [phone]  Pt is being d/c Friday and will need home PT.  Dr Sharlett Iles is placing the order but once d/c home needs a contact MD for concerns or additional orders.  Inquiry is " would Dr Julien Nordmann be willing to be MD for above?"  THIS NOTE WILL BE SENT TO MD AND RN AT [facility].

## 2014-06-19 ENCOUNTER — Ambulatory Visit
Admission: RE | Admit: 2014-06-19 | Discharge: 2014-06-19 | Disposition: A | Discharge status: Home / Self Care | Payer: Medicare Other | Source: Ambulatory Visit | Attending: Radiation Oncology | Admitting: Radiation Oncology

## 2014-06-19 ENCOUNTER — Telehealth: Payer: Self-pay | Admitting: Radiation Oncology

## 2014-06-19 ENCOUNTER — Encounter: Payer: Self-pay | Admitting: Radiation Oncology

## 2014-06-19 ENCOUNTER — Ambulatory Visit: Payer: Medicare Other | Admitting: Radiation Oncology

## 2014-06-19 VITALS — BP 133/65 | HR 105 | Resp 18 | Wt 186.3 lb

## 2014-06-19 DIAGNOSIS — C7951 Secondary malignant neoplasm of bone: Secondary | ICD-10-CM

## 2014-06-19 DIAGNOSIS — Z510 Encounter for antineoplastic radiation therapy: Secondary | ICD-10-CM | POA: Diagnosis not present

## 2014-06-19 NOTE — Progress Notes (Signed)
  Radiation Oncology         [phone] ________________________________  Name: Logan Russell MRN: [id number]  Date: 06/19/2014  DOB: [birth year]  Weekly Radiation Therapy Management    ICD-9-CM ICD-10-CM   1. Metastatic cancer to spine 198.5 C79.51     Current Dose: 5 Gy     Planned Dose:  35 Gy  Narrative . . . . . . . . The patient presents for routine under treatment assessment.                                   Patient discharged home from [facility] today. Patient in wheelchair. Patient able to stand for weight with one person assist. Weight and vitals stable. Reports tolerable low back pain 2 on a scale of 0-10 at surgical site. Reports control of bowels is returning. Foley catheter to gravity drain noted with clear yellow urine. Reports numbness and tingling of lower extremities has resolved. Edema of bilateral lower extremities noted. Encouraged patient to elevate extremities above level of heart while at home. Patient verbalized understanding.                                  Set-up films were reviewed.                                 The chart was checked. Physical Findings. . .  weight is 186 lb 4.8 oz (84.505 kg). His blood pressure is 133/65 and his pulse is 105. His respiration is 18. . Weight essentially stable.  No significant changes. Impression . . . . . . . The patient is tolerating radiation. Plan . . . . . . . . . . . . Continue treatment as planned.  ________________________________  Sheral Apley. Tammi Klippel, M.D.

## 2014-06-19 NOTE — Progress Notes (Signed)
Patient discharged home from [facility] today. Patient in wheelchair. Patient able to stand for weight with one person assist. Weight and vitals stable. Reports tolerable low back pain 2 on a scale of 0-10 at surgical site. Reports control of bowels is returning. Foley catheter to gravity drain noted with clear yellow urine. Reports numbness and tingling of lower extremities has resolved. Edema of bilateral lower extremities noted. Encouraged patient to elevate extremities above level of heart while at home. Patient verbalized understanding.

## 2014-06-19 NOTE — Telephone Encounter (Signed)
Received a call from [place]. She reports this patient is being discharged home today with his wife. She reports that Dr. Sharlett Iles of [facility] is writing the initial order for home health to [facility]. She questions if Dr. Tammi Klippel will manage any future home needs since the patient is under his care and medical oncology refused. Will call Benjamine Mola back with Dr. Johny Shears response.

## 2014-06-20 ENCOUNTER — Ambulatory Visit: Payer: Medicare Other

## 2014-06-22 NOTE — Telephone Encounter (Signed)
Sam,  I had not seen this earlier.  I am happy to participate in the patient's care.  I am not sure what is being requested here.  If this is for me to manage the patient's home health orders as they arise, that may be better for Dr. Arelia Sneddon, if that is still his PCP.  If Dr. Arelia Sneddon is not involved in his care any longer, I can help make sure orders are signed.  MM

## 2014-06-23 ENCOUNTER — Ambulatory Visit: Payer: Medicare Other

## 2014-06-23 ENCOUNTER — Telehealth: Payer: Self-pay | Admitting: Radiation Oncology

## 2014-06-23 NOTE — Telephone Encounter (Signed)
Received call from [place], physical therapist for [facility], that should would evaluate the patient's PT needs tomorrow since the hill leading up to his house is not passable due to snow.

## 2014-06-23 NOTE — Telephone Encounter (Signed)
OK, just let me know what I need to do.

## 2014-06-24 ENCOUNTER — Ambulatory Visit: Payer: Medicare Other

## 2014-06-25 ENCOUNTER — Ambulatory Visit
Admission: RE | Admit: 2014-06-25 | Discharge: 2014-06-25 | Disposition: A | Discharge status: Home / Self Care | Payer: Medicare Other | Source: Ambulatory Visit | Attending: Radiation Oncology | Admitting: Radiation Oncology

## 2014-06-25 DIAGNOSIS — Z510 Encounter for antineoplastic radiation therapy: Secondary | ICD-10-CM | POA: Diagnosis not present

## 2014-06-26 ENCOUNTER — Ambulatory Visit
Admission: RE | Admit: 2014-06-26 | Discharge: 2014-06-26 | Disposition: A | Discharge status: Home / Self Care | Payer: Medicare Other | Source: Ambulatory Visit | Attending: Radiation Oncology | Admitting: Radiation Oncology

## 2014-06-26 ENCOUNTER — Telehealth: Payer: Self-pay | Admitting: Radiation Oncology

## 2014-06-26 ENCOUNTER — Encounter: Payer: Self-pay | Admitting: Radiation Oncology

## 2014-06-26 VITALS — BP 99/52 | HR 106 | Temp 99.1°F | Resp 20 | Wt 181.2 lb

## 2014-06-26 DIAGNOSIS — C7951 Secondary malignant neoplasm of bone: Secondary | ICD-10-CM

## 2014-06-26 DIAGNOSIS — Z510 Encounter for antineoplastic radiation therapy: Secondary | ICD-10-CM | POA: Diagnosis not present

## 2014-06-26 MED ORDER — OXYCODONE HCL 20 MG PO TABS: 20.0000 mg | ORAL_TABLET | Freq: Four times a day (QID) | ORAL | Status: DC | PRN

## 2014-06-26 NOTE — Progress Notes (Signed)
  Radiation Oncology         [phone] ________________________________  Name: Logan Russell MRN: [id number]  Date: 06/26/2014  DOB: January 10, 1935  Weekly Radiation Therapy Management    ICD-9-CM ICD-10-CM   1. Metastatic cancer to spine 198.5 C79.51     Current Dose: 10 Gy     Planned Dose:  35 Gy  Narrative . . . . . . . . The patient presents for routine under treatment assessment.                                  Patient reports pain in his lower back 5/10 on pain scale. He takes Oxycodone 20 mg 3 times daily for pain control; he is requesting a refill today. He states he is fatigued, weak, but his appetite is improving since he is back home now. He is receiving rehab at home, [facility] nurse to begin next week. Patient's wife states the "sore on his back got better at [facility] but is worse since he's been home". She states he was told he should only lie on his back while sleeping, and he sits in a chair all day. Advised she call his surgeon's office and speak with the nurse re: if he can lie on his sides to sleep. Also advised he shift his weight from hip to hip every 2 hours, using pillows to prop himself as needed while in a chair and that his wife massage the area and apply lotion. She states "it's too sore for me to do that".  Advised they discuss his sore with the [facility] nurse next week and try the measures mentioned. Wife states pt's control of bowels are a little improved now; she gives him stool softeners prn. Patient has indwelling foley catheter.  Patient has received radiation treatment in past to his lung. Reviewed temporary side effects of fatigue and diarrhea with pt and wife. Advised wife buy Imodium to have on hand. Teach back method used. Patient and wife verbalized understanding.                                 Set-up films were reviewed.                                 The chart was checked. Physical Findings. . .  weight is 181 lb 3.2 oz (82.192 kg). His oral temperature is 99.1 F  (37.3 C). His blood pressure is 99/52 and his pulse is 106. His respiration is 20. . Weight essentially stable.  No significant changes. Impression . . . . . . . The patient is tolerating radiation. Plan . . . . . . . . . . . . Continue treatment as planned.  Refilled OxyCodone.  ________________________________  Sheral Apley Tammi Klippel, M.D.

## 2014-06-26 NOTE — Telephone Encounter (Signed)
Received a call from [place], physical therapist for [facility]. She is requesting a verbal order for physical therapy twice a week for four weeks. Also, she is requesting a verbal order to place an order for a nurse from [facility] to evaluate and educate him on foley care.

## 2014-06-26 NOTE — Progress Notes (Signed)
Patient reports pain in his lower back 5/10 on pain scale. He takes Oxycodone 20 mg 3 times daily for pain control; he is requesting a refill today. He states he is fatigued, weak, but his appetite is improving since he is back home now. He is receiving rehab at home, [facility] nurse to begin next week. Patient's wife states the "sore on his back got better at [facility] but is worse since he's been home". She states he was told he should only lie on his back while sleeping, and he sits in a chair all day. Advised she call his surgeon's office and speak with the nurse re: if he can lie on his sides to sleep. Also advised he shift his weight from hip to hip every 2 hours, using pillows to prop himself as needed while in a chair and that his wife massage the area and apply lotion. She states "it's too sore for me to do that".  Advised they discuss his sore with the [facility] nurse next week and try the measures mentioned. Wife states pt's control of bowels are a little improved now; she gives him stool softeners prn. Patient has indwelling foley catheter.  Patient has received radiation treatment in past to his lung. Reviewed temporary side effects of fatigue and diarrhea with pt and wife. Advised wife buy Imodium to have on hand. Teach back method used. Patient and wife verbalized understanding.

## 2014-06-27 ENCOUNTER — Ambulatory Visit
Admission: RE | Admit: 2014-06-27 | Discharge: 2014-06-27 | Disposition: A | Discharge status: Home / Self Care | Payer: Medicare Other | Source: Ambulatory Visit | Attending: Radiation Oncology | Admitting: Radiation Oncology

## 2014-06-27 DIAGNOSIS — Z510 Encounter for antineoplastic radiation therapy: Secondary | ICD-10-CM | POA: Diagnosis not present

## 2014-06-27 NOTE — Telephone Encounter (Signed)
Yes, let's order the PT and home health RN assessment.

## 2014-06-30 ENCOUNTER — Ambulatory Visit
Admission: RE | Admit: 2014-06-30 | Discharge: 2014-06-30 | Disposition: A | Discharge status: Home / Self Care | Payer: Medicare Other | Source: Ambulatory Visit | Attending: Radiation Oncology | Admitting: Radiation Oncology

## 2014-06-30 DIAGNOSIS — Z510 Encounter for antineoplastic radiation therapy: Secondary | ICD-10-CM | POA: Diagnosis not present

## 2014-07-01 ENCOUNTER — Telehealth: Payer: Self-pay

## 2014-07-01 ENCOUNTER — Ambulatory Visit
Admission: RE | Admit: 2014-07-01 | Discharge: 2014-07-01 | Disposition: A | Discharge status: Home / Self Care | Payer: Medicare Other | Source: Ambulatory Visit | Attending: Radiation Oncology | Admitting: Radiation Oncology

## 2014-07-01 DIAGNOSIS — Z510 Encounter for antineoplastic radiation therapy: Secondary | ICD-10-CM | POA: Diagnosis not present

## 2014-07-01 NOTE — Telephone Encounter (Signed)
Spoke with [facility] and told them that the folic acid will not be refilled as Mr. Logan Russell is not currently taking the [place] chemotherapy per [name]. Left a message in the patient's home phone number answering machine as he was not noted as a resident at the [facility] [place].

## 2014-07-02 ENCOUNTER — Ambulatory Visit
Admission: RE | Admit: 2014-07-02 | Discharge: 2014-07-02 | Disposition: A | Discharge status: Home / Self Care | Payer: Medicare Other | Source: Ambulatory Visit | Attending: Radiation Oncology | Admitting: Radiation Oncology

## 2014-07-02 DIAGNOSIS — Z510 Encounter for antineoplastic radiation therapy: Secondary | ICD-10-CM | POA: Diagnosis not present

## 2014-07-03 ENCOUNTER — Ambulatory Visit
Admission: RE | Admit: 2014-07-03 | Discharge: 2014-07-03 | Disposition: A | Discharge status: Home / Self Care | Payer: Medicare Other | Source: Ambulatory Visit | Attending: Radiation Oncology | Admitting: Radiation Oncology

## 2014-07-03 DIAGNOSIS — Z510 Encounter for antineoplastic radiation therapy: Secondary | ICD-10-CM | POA: Diagnosis not present

## 2014-07-04 ENCOUNTER — Ambulatory Visit
Admission: RE | Admit: 2014-07-04 | Discharge: 2014-07-04 | Disposition: A | Discharge status: Home / Self Care | Payer: Medicare Other | Source: Ambulatory Visit | Attending: Radiation Oncology | Admitting: Radiation Oncology

## 2014-07-04 ENCOUNTER — Encounter: Payer: Self-pay | Admitting: Radiation Oncology

## 2014-07-04 VITALS — BP 140/72 | HR 108 | Resp 16

## 2014-07-04 DIAGNOSIS — Z510 Encounter for antineoplastic radiation therapy: Secondary | ICD-10-CM | POA: Diagnosis not present

## 2014-07-04 DIAGNOSIS — C7951 Secondary malignant neoplasm of bone: Secondary | ICD-10-CM

## 2014-07-04 NOTE — Progress Notes (Signed)
Reports low back pain is unchanged and remains 2 on a scale of 0-10. Foley catheter to gravity drain noted. Edema of bilateral lower extremities is worse. Encouraged wife to contact PCP reference edema. Again, encouraged patient to rest with extremities above the level of his heart. Back brace noted. Reports fatigue. Reports constipation x 2 days. BP and heart rate slightly elevated. Denies numbness or tingling of lower extremities.

## 2014-07-04 NOTE — Progress Notes (Signed)
  Radiation Oncology         [phone] ________________________________  Name: Logan Russell  MRN: [id number]  Date: 07/04/2014  DOB: [birth year]  Weekly Radiation Therapy Management    ICD-9-CM ICD-10-CM   1. Metastatic cancer to spine 198.5 C79.51     Current Dose: 25 Gy     Planned Dose:  35 Gy  Narrative . . . . . . . . The patient presents for routine under treatment assessment.                                   Reports low back pain is unchanged and remains 2 on a scale of 0-10. Foley catheter to gravity drain noted. Edema of bilateral lower extremities is worse. Encouraged wife to contact PCP reference edema. Again, encouraged patient to rest with extremities above the level of his heart. Back brace noted. Reports fatigue. Reports constipation x 2 days. BP and heart rate slightly elevated. Denies numbness or tingling of lower extremities.                                 Set-up films were reviewed.                                 The chart was checked. Physical Findings. . .  blood pressure is 140/72 and his pulse is 108. His respiration is 16. . Weight essentially stable.  No significant changes. Impression . . . . . . . The patient is tolerating radiation. Plan . . . . . . . . . . . . Continue treatment as planned.  ________________________________  Sheral Apley. Tammi Klippel, M.D.

## 2014-07-07 ENCOUNTER — Ambulatory Visit: Payer: Medicare Other

## 2014-07-07 ENCOUNTER — Ambulatory Visit
Admission: RE | Admit: 2014-07-07 | Discharge: 2014-07-07 | Disposition: A | Discharge status: Home / Self Care | Payer: Medicare Other | Source: Ambulatory Visit | Attending: Radiation Oncology | Admitting: Radiation Oncology

## 2014-07-07 DIAGNOSIS — Z510 Encounter for antineoplastic radiation therapy: Secondary | ICD-10-CM | POA: Diagnosis not present

## 2014-07-08 ENCOUNTER — Ambulatory Visit: Payer: Medicare Other

## 2014-07-08 ENCOUNTER — Ambulatory Visit
Admission: RE | Admit: 2014-07-08 | Discharge: 2014-07-08 | Disposition: A | Discharge status: Home / Self Care | Payer: Medicare Other | Source: Ambulatory Visit | Attending: Radiation Oncology | Admitting: Radiation Oncology

## 2014-07-08 DIAGNOSIS — Z510 Encounter for antineoplastic radiation therapy: Secondary | ICD-10-CM | POA: Diagnosis not present

## 2014-07-09 ENCOUNTER — Ambulatory Visit
Admission: RE | Admit: 2014-07-09 | Discharge: 2014-07-09 | Disposition: A | Discharge status: Home / Self Care | Payer: Medicare Other | Source: Ambulatory Visit | Attending: Radiation Oncology | Admitting: Radiation Oncology

## 2014-07-09 ENCOUNTER — Ambulatory Visit: Payer: Medicare Other

## 2014-07-09 DIAGNOSIS — Z510 Encounter for antineoplastic radiation therapy: Secondary | ICD-10-CM | POA: Diagnosis not present

## 2014-07-10 ENCOUNTER — Ambulatory Visit
Admission: RE | Admit: 2014-07-10 | Discharge: 2014-07-10 | Disposition: A | Discharge status: Home / Self Care | Payer: Medicare Other | Source: Ambulatory Visit | Attending: Radiation Oncology | Admitting: Radiation Oncology

## 2014-07-10 ENCOUNTER — Encounter: Payer: Self-pay | Admitting: Radiation Oncology

## 2014-07-10 ENCOUNTER — Ambulatory Visit: Admission: RE | Admit: 2014-07-10 | Payer: Medicare Other | Source: Ambulatory Visit

## 2014-07-10 VITALS — BP 110/65 | HR 115 | Resp 16 | Wt 176.7 lb

## 2014-07-10 DIAGNOSIS — Z510 Encounter for antineoplastic radiation therapy: Secondary | ICD-10-CM | POA: Diagnosis not present

## 2014-07-10 DIAGNOSIS — C7951 Secondary malignant neoplasm of bone: Secondary | ICD-10-CM

## 2014-07-10 NOTE — Progress Notes (Signed)
Reports low back pain is unchanged. Reports low back pain 2 on a scale of 0-10. Foley catheter to gravity drain noted. Back brace noted. Edema of bilateral lower extremities better today with the aid of LASIX. Reports fatigue. Dry cough noted. Reports constipation has resolved. Reports having a BM this morning. HR elevated. Weight stable. Denies numbness or tingling in extremities. One month follow up appointment card given.

## 2014-07-10 NOTE — Progress Notes (Signed)
  Radiation Oncology         (336) [phone] ________________________________  Name: Logan Russell MRN: [id number]  Date: 07/10/2014  DOB: Apr 28, 1935  Weekly Radiation Therapy Management    ICD-9-CM ICD-10-CM   1. Metastatic cancer to spine 198.5 C79.51     Current Dose: 35 Gy     Planned Dose:  35 Gy  Narrative . . . . . . . . The patient presents for the final under treatment assessment.                                            Reports low back pain is unchanged. Reports low back pain 2 on a scale of 0-10. Foley catheter to gravity drain noted. Back brace noted. Edema of bilateral lower extremities better today with the aid of LASIX. Reports fatigue. Dry cough noted. Reports constipation has resolved. Reports having a BM this morning. HR elevated. Weight stable. Denies numbness or tingling in extremities. One month follow up appointment card given.                                  Set-up films were reviewed.                                 The chart was checked. Physical Findings. . . Weight essentially stable.  No significant changes. Impression . . . . . . . The patient tolerated radiation relatively well. Plan . . . . . . . . . . . . Complete radiation today as scheduled, and follow-up in one month. The patient was encouraged to call or return to the clinic in the interim for any worsening symptoms.  ________________________________  Sheral Apley Tammi Klippel, M.D.

## 2014-07-14 NOTE — Progress Notes (Signed)
  Radiation Oncology         (336) [phone] ________________________________  Name: Logan Russell MRN: [id number]  Date: 07/10/2014  DOB: 19-Jul-1934  End of Treatment Note    ICD-9-CM ICD-10-CM   1. Metastatic cancer to spine 198.5 C79.51     DIAGNOSIS: 80 year old gentleman with L5 lumbar spinal metastasis from adenocarcinoma the right upper lung     Indication for treatment:  Palliation       Radiation treatment dates:   06/18/2014-07/10/2014  Site/dose:   The L4 through SI joint region of the lower spine was treated to 35 Gy in 14 fractions of 2.5 Gy  Beams/energy:   Anterior and posterior 15 megavolt x-ray beams were used. These were conformally shaped to exclude critical uninvolved structures including the kidneys bowel and bladder.  Narrative: The patient tolerated radiation treatment relatively well.   His pain improved during radiation  Plan: The patient has completed radiation treatment. The patient will return to radiation oncology clinic for routine followup in one month. I advised them to call or return sooner if they have any questions or concerns related to their recovery or treatment. ________________________________  Sheral Apley. Tammi Klippel, M.D.

## 2014-07-17 ENCOUNTER — Telehealth: Payer: Self-pay | Admitting: Radiation Oncology

## 2014-07-17 NOTE — Telephone Encounter (Signed)
Received a call from [facility] requesting an order to continue home health services twice a week for the next four weeks. She reports that the patient is gradually improving but, continues to struggle with fatigue and weakness. Reports mostly the patient ambulates with the aid of a walker but, was able to ambulate independently with the physical therapist yesterday.

## 2014-07-17 NOTE — Telephone Encounter (Signed)
I am happy to help.  What do I need to do?  Do they fax a paper order form?

## 2014-08-07 ENCOUNTER — Telehealth: Payer: Self-pay | Admitting: Radiation Oncology

## 2014-08-07 ENCOUNTER — Ambulatory Visit: Payer: Medicare Other | Admitting: Radiation Oncology

## 2014-08-07 NOTE — Telephone Encounter (Signed)
Received call from [facility]. She reports the patient is doing wonderfully. She goes on the explain he went on his own to the convinent store today. Reports she plans to discharge him from home health tomorrow if he is continuing to do so well.

## 2014-08-08 ENCOUNTER — Ambulatory Visit (HOSPITAL_COMMUNITY)
Admission: RE | Admit: 2014-08-08 | Discharge: 2014-08-08 | Disposition: A | Discharge status: Home / Self Care | Payer: Medicare Other | Source: Ambulatory Visit | Attending: Physician Assistant | Admitting: Physician Assistant

## 2014-08-08 ENCOUNTER — Other Ambulatory Visit (HOSPITAL_BASED_OUTPATIENT_CLINIC_OR_DEPARTMENT_OTHER): Payer: Medicare Other

## 2014-08-08 DIAGNOSIS — Z9221 Personal history of antineoplastic chemotherapy: Secondary | ICD-10-CM | POA: Diagnosis not present

## 2014-08-08 DIAGNOSIS — R05 Cough: Secondary | ICD-10-CM | POA: Insufficient documentation

## 2014-08-08 DIAGNOSIS — C3411 Malignant neoplasm of upper lobe, right bronchus or lung: Secondary | ICD-10-CM | POA: Diagnosis present

## 2014-08-08 DIAGNOSIS — C3432 Malignant neoplasm of lower lobe, left bronchus or lung: Secondary | ICD-10-CM

## 2014-08-08 LAB — CBC WITH DIFFERENTIAL/PLATELET
BASO%: 0.5 % (ref 0.0–2.0)
BASOS ABS: 0 10*3/uL (ref 0.0–0.1)
EOS%: 4.6 % (ref 0.0–7.0)
Eosinophils Absolute: 0.3 10*3/uL (ref 0.0–0.5)
HEMATOCRIT: 31.1 % — AB (ref 38.4–49.9)
HGB: 9.7 g/dL — ABNORMAL LOW (ref 13.0–17.1)
LYMPH%: 13.4 % — ABNORMAL LOW (ref 14.0–49.0)
MCH: 30.1 pg (ref 27.2–33.4)
MCHC: 31.2 g/dL — AB (ref 32.0–36.0)
MCV: 96.6 fL (ref 79.3–98.0)
MONO#: 0.5 10*3/uL (ref 0.1–0.9)
MONO%: 7.5 % (ref 0.0–14.0)
NEUT#: 4.5 10*3/uL (ref 1.5–6.5)
NEUT%: 74 % (ref 39.0–75.0)
Platelets: 208 10*3/uL (ref 140–400)
RBC: 3.22 10*6/uL — ABNORMAL LOW (ref 4.20–5.82)
RDW: 17.7 % — ABNORMAL HIGH (ref 11.0–14.6)
WBC: 6 10*3/uL (ref 4.0–10.3)
lymph#: 0.8 10*3/uL — ABNORMAL LOW (ref 0.9–3.3)

## 2014-08-08 LAB — COMPREHENSIVE METABOLIC PANEL (CC13)
ALK PHOS: 72 U/L (ref 40–150)
ALT: 17 U/L (ref 0–55)
ANION GAP: 10 meq/L (ref 3–11)
AST: 18 U/L (ref 5–34)
Albumin: 3 g/dL — ABNORMAL LOW (ref 3.5–5.0)
BILIRUBIN TOTAL: 0.31 mg/dL (ref 0.20–1.20)
BUN: 23.7 mg/dL (ref 7.0–26.0)
CO2: 27 mEq/L (ref 22–29)
Calcium: 8.8 mg/dL (ref 8.4–10.4)
Chloride: 104 mEq/L (ref 98–109)
Creatinine: 1.1 mg/dL (ref 0.7–1.3)
EGFR: 62 mL/min/{1.73_m2} — AB (ref >90)
Glucose: 97 mg/dl (ref 70–140)
POTASSIUM: 3.4 meq/L — AB (ref 3.5–5.1)
SODIUM: 141 meq/L (ref 136–145)
TOTAL PROTEIN: 6.5 g/dL (ref 6.4–8.3)

## 2014-08-08 MED ORDER — IOHEXOL 300 MG/ML  SOLN
50.0000 mL | Freq: Once | INTRAMUSCULAR | Status: AC | PRN
  Administered 2014-08-08: 50 mL via ORAL

## 2014-08-11 ENCOUNTER — Other Ambulatory Visit: Payer: Medicare Other

## 2014-08-11 ENCOUNTER — Telehealth: Payer: Self-pay | Admitting: Internal Medicine

## 2014-08-11 ENCOUNTER — Ambulatory Visit (HOSPITAL_BASED_OUTPATIENT_CLINIC_OR_DEPARTMENT_OTHER): Payer: Medicare Other | Admitting: Internal Medicine

## 2014-08-11 ENCOUNTER — Encounter: Payer: Self-pay | Admitting: Internal Medicine

## 2014-08-11 VITALS — BP 148/82 | HR 91 | Temp 98.4°F | Resp 18 | Ht 66.5 in | Wt 175.9 lb

## 2014-08-11 DIAGNOSIS — C7951 Secondary malignant neoplasm of bone: Secondary | ICD-10-CM

## 2014-08-11 DIAGNOSIS — C3411 Malignant neoplasm of upper lobe, right bronchus or lung: Secondary | ICD-10-CM

## 2014-08-11 DIAGNOSIS — C3432 Malignant neoplasm of lower lobe, left bronchus or lung: Secondary | ICD-10-CM

## 2014-08-11 NOTE — Progress Notes (Signed)
.      [place] [phone]   Fax:([phone]  OFFICE PROGRESS NOTE  [name], MD [place] [address]  DIAGNOSIS: Stage IIIB (T2a., N3, M0) non-small cell lung cancer consistent with invasive adenocarcinoma with negative ALK gene translocation and negative EGFR mutation diagnosed in October of 2013   PRIOR THERAPY:  1) Concurrent chemoradiation with weekly carboplatin for AUC of 2 and paclitaxel 45 mg/M2, last dose was given 06/11/2012 with partial response.  2) Consolidation chemotherapy with carboplatin for AUC of 5 and Alimta 500 mg/M2 every 3 weeks. Status post 3 cycles last dose was given 09/12/2012 with stable disease.  3) Systemic chemotherapy again with carboplatin for AUC of 5 and Alimta 500 mg/M2 every 3 weeks. First cycle expected on 11/13/2013. Status post 3 cycles, discontinued secondary to disease progression. Carboplatin was discontinued starting from cycle #2 secondary to hypersensitivity reaction. 4) Immunotherapy with single agent Nivolumab 3 MG/TG every 2 weeks status post 8 cycles.  5) Decompressive lumbar laminectomies from L4 to S1 for removal of metastatic tumor bilateral under the care of Dr. Saintclair Halsted on 05/27/2014. 6) status post palliative radiotherapy to the L4-S1 joint region of the lower spine for a total dose of 75 GYN he completed on 07/10/2014.  CURRENT THERAPY: None.  CHEMOTHERAPY INTENT: Palliative  CURRENT # OF CHEMOTHERAPY CYCLES: 0  CURRENT ANTIEMETICS: N/A  CURRENT SMOKING STATUS: Former Smoker  ORAL CHEMOTHERAPY AND CONSENT: None  CURRENT BISPHOSPHONATES USE: None  PAIN MANAGEMENT: 0/10  NARCOTICS INDUCED CONSTIPATION: None  LIVING WILL AND CODE STATUS: no code Blue  INTERVAL HISTORY: WAYLIN DORKO 80 y.o. male returns to the clinic today for follow-up visit accompanied by his wife. The patient is doing much better today and he is able to ambulate with a cane at home. He  underwent decompression of the L4-S1 followed by palliative radiotherapy to this area under the care of Dr. Tammi Klippel completed in February 2016. He denied having any nausea or vomiting, no fever or chills, no diarrhea or skin rash. The patient denied having any significant weight loss or night sweats. He denied having any significant chest pain but continues to have shortness breath with exertion with no cough or hemoptysis. He had repeat CT scan of the chest, abdomen and pelvis performed recently and he is here for evaluation and discussion of his scan results.  MEDICAL HISTORY: Past Medical History  Diagnosis Date  . Hypertension   . History of radiation therapy 05/02/12-06/19/12    left lung mediastinal lymph nodes 66Gy/44fx  . Lung cancer 04/12/2012  . Cauda equina syndrome   . 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  MEDICATIONS:  Current Outpatient Prescriptions  Medication Sig Dispense Refill  . furosemide (LASIX) 20 MG tablet Take 20 mg by mouth daily.    Marland Kitchen guaiFENesin (MUCINEX) 600 MG 12 hr tablet Take 2 tablets (1,200 mg total) by mouth 2 (two) times daily as needed for cough or to loosen phlegm.    . tamsulosin (FLOMAX) 0.4 MG CAPS capsule Take 0.4 mg by mouth 2 (two) times daily.    . vitamin C (ASCORBIC ACID) 500 MG tablet Take 500 mg by mouth daily.    Marland Kitchen zinc sulfate 220 MG capsule Take 220 mg by mouth daily.    Marland Kitchen acetaminophen (TYLENOL) 325 MG tablet Take 2 tablets (650 mg total) by mouth  every 4 (four) hours as needed for mild pain or fever (temp > 100.5). (Patient not taking: Reported on 08/11/2014)    . bisacodyl (DULCOLAX) 10 MG suppository Place 1 suppository (10 mg total) rectally daily as needed for severe constipation. (Patient not taking: Reported on 08/11/2014)    . Oxycodone HCl 20 MG TABS Take 1 tablet (20 mg total) by mouth every 6 (six) hours as needed (for moderate - severe pain.). 100 tablet 0  .  polyethylene glycol (MIRALAX / GLYCOLAX) packet Take 17 g by mouth daily as needed for moderate constipation. (Patient not taking: Reported on 08/11/2014)    . promethazine (PHENERGAN) 25 MG tablet Take 25 mg by mouth every 6 (six) hours as needed for nausea or vomiting.    . senna (SENOKOT) 8.6 MG TABS tablet Take 1 tablet (8.6 mg total) by mouth daily as needed for mild constipation. (Patient not taking: Reported on 08/11/2014)     No current facility-administered medications for this visit.    REVIEW OF SYSTEMS:  Constitutional: positive for fatigue Eyes: negative Ears, nose, mouth, throat, and face: negative Respiratory: positive for dyspnea on exertion Cardiovascular: negative Gastrointestinal: negative Genitourinary:negative Integument/breast: negative Hematologic/lymphatic: negative Musculoskeletal:negative Neurological: negative Behavioral/Psych: negative Endocrine: negative Allergic/Immunologic: negative   PHYSICAL EXAMINATION: General appearance: alert, cooperative, fatigued and no distress Head: Normocephalic, without obvious abnormality, atraumatic Neck: no adenopathy Lymph nodes: Cervical, supraclavicular, and axillary nodes normal. Resp: clear to auscultation bilaterally Back: symmetric, no curvature. ROM normal. No CVA tenderness. Cardio: regular rate and rhythm, S1, S2 normal, no murmur, click, rub or gallop GI: soft, non-tender; bowel sounds normal; no masses,  no organomegaly Extremities: extremities normal, atraumatic, no cyanosis or edema Neurologic: Alert and oriented X 3, normal strength and tone. Normal symmetric reflexes. Normal coordination and gait  ECOG PERFORMANCE STATUS: 1 - Symptomatic but completely ambulatory  Blood pressure 148/82, pulse 91, temperature 98.4 F (36.9 C), temperature source Oral, resp. rate 18, height 5' 6.5" (1.689 m), weight 175 lb 14.4 oz (79.788 kg), SpO2 100 %.  LABORATORY DATA: Lab Results  Component Value Date   WBC 6.0  08/08/2014   HGB 9.7* 08/08/2014   HCT 31.1* 08/08/2014   MCV 96.6 08/08/2014   PLT 208 08/08/2014      Chemistry      Component Value Date/Time   NA 141 08/08/2014 0916   NA 134* 06/03/2014 0356   K 3.4* 08/08/2014 0916   K 3.6 06/03/2014 0356   CL 101 06/03/2014 0356   CL 106 10/08/2012 1028   CO2 27 08/08/2014 0916   CO2 27 06/03/2014 0356   BUN 23.7 08/08/2014 0916   BUN 29* 06/03/2014 0356   CREATININE 1.1 08/08/2014 0916   CREATININE 1.66* 06/03/2014 0356      Component Value Date/Time   CALCIUM 8.8 08/08/2014 0916   CALCIUM 8.4 06/03/2014 0356   ALKPHOS 72 08/08/2014 0916   ALKPHOS 66 05/28/2014 0805   AST 18 08/08/2014 0916   AST 17 05/28/2014 0805   ALT 17 08/08/2014 0916   ALT 15 05/28/2014 0805   BILITOT 0.31 08/08/2014 0916   BILITOT 0.5 05/28/2014 0805       RADIOGRAPHIC STUDIES: Ct Abdomen Pelvis Wo Contrast  08/08/2014   CLINICAL DATA:  Restaging non-small cell lung cancer post chemotherapy completed 6 months ago. Persistent cough. Subsequent encounter.  EXAM: CT CHEST, ABDOMEN AND PELVIS WITHOUT CONTRAST  TECHNIQUE: Multidetector CT imaging of the chest, abdomen and pelvis was performed following the standard protocol without  IV contrast.  COMPARISON:  Chest CT 05/21/2014, CT of the chest, abdomen and pelvis 03/07/2014 and PET-CT 04/23/2013. Lumbar MRI 05/24/2014.  FINDINGS: CT CHEST FINDINGS  Mediastinum/Nodes: Nodal evaluation limited by a lack of intravenous contrast and presumed radiation changes in the right perihilar region. Subcarinal node measuring 1.2 cm short axis on image 32 previously measured 1.4 cm. Right infrahilar and perihilar soft tissue fullness appears unchanged. No progressive adenopathy identified. The thyroid gland, trachea and esophagus demonstrate no significant findings. The heart size is normal. Minimal pericardial fluid versus thickening is stable.Stable atherosclerosis of the aorta, great vessels and coronary arteries.   Lungs/Pleura: Minimal pleural fluid on the left.Stable perihilar radiation changes bilaterally with hilar distortion and central bronchial irregularity. There is stable associated scarring and volume loss in the right middle lobe, superior segment of the left lower lobe and lingula. 7 mm right lower lobe pulmonary nodule on image 39 is unchanged. No new or enlarging nodules identified.  Musculoskeletal/Chest wall: No suspicious chest wall lesion or osseous finding.  CT ABDOMEN AND PELVIS FINDINGS  Hepatobiliary: Scattered small low-density hepatic lesions are grossly stable, most consistent with cysts. No new or enlarging lesions identified. No evidence of gallstones, gallbladder wall thickening or biliary dilatation.  Pancreas: Unremarkable. No pancreatic ductal dilatation or surrounding inflammatory changes.  Spleen: Normal in size without focal abnormality.  Adrenals/Urinary Tract: Mild bilateral adrenal nodularity is stable. Stable cyst posteriorly in the left kidney. No hydronephrosis or asymmetric perinephric soft tissue stranding. No evidence of urinary tract calculus. The urinary bladder is decompressed by a Foley catheter. There is suspected bladder wall thickening and perivesical soft tissue stranding.  Stomach/Bowel: No evidence of bowel wall thickening, distention or surrounding inflammatory change.There are diverticular changes throughout the distal colon. The appendix appears normal.  Vascular/Lymphatic: There is progressive enlargement of multiple retroperitoneal and pelvic lymph nodes. Representative nodes include: 1.5 cm aortic caval node on image 66, 1.9 cm right paracaval node on image 69, a 2.0 cm aortic caval node on image 69 (previously 1.5 cm), a 1.2 cm left periaortic node on image 72 and a 3.8 x 2.0 cm left pelvic sidewall node on image 101 (previously 2.6 x 2.6 cm). Extensive atherosclerosis of the aorta and iliac arteries is grossly stable.  Reproductive: Grossly stable moderate  enlargement of the prostate gland.  Other: No evidence of abdominal wall mass or hernia.  Musculoskeletal: There are interval surgical changes at L5 status post decompressive laminectomy. There is mildly progressive destruction of the L4 and L5 vertebral bodies with associated biforaminal narrowing at L4-5 and L5-S1. There is nonspecific fluid density within the laminectomy bed. No other osseous lesions observed.  IMPRESSION: 1. The findings in the chest are stable with paramediastinal radiation changes bilaterally and residual right hilar and mediastinal lymphadenopathy. 2. Pulmonary parenchymal scarring and nodularity are unchanged. 3. Progressive destruction of the L4 and L5 vertebral bodies status post decompressive laminectomy (adenocarcinoma at pathology). 4. Progressive retroperitoneal and pelvic adenopathy consistent with metastatic disease. 5. Nonspecific bladder wall thickening and perivesical edema. This may be secondary prior radiation therapy or cystitis.   Electronically Signed   By: Richardean Sale M.D.   On: 08/08/2014 11:58   Ct Chest Wo Contrast  08/08/2014   CLINICAL DATA:  Restaging non-small cell lung cancer post chemotherapy completed 6 months ago. Persistent cough. Subsequent encounter.  EXAM: CT CHEST, ABDOMEN AND PELVIS WITHOUT CONTRAST  TECHNIQUE: Multidetector CT imaging of the chest, abdomen and pelvis was performed following the standard protocol without  IV contrast.  COMPARISON:  Chest CT 05/21/2014, CT of the chest, abdomen and pelvis 03/07/2014 and PET-CT 04/23/2013. Lumbar MRI 05/24/2014.  FINDINGS: CT CHEST FINDINGS  Mediastinum/Nodes: Nodal evaluation limited by a lack of intravenous contrast and presumed radiation changes in the right perihilar region. Subcarinal node measuring 1.2 cm short axis on image 32 previously measured 1.4 cm. Right infrahilar and perihilar soft tissue fullness appears unchanged. No progressive adenopathy identified. The thyroid gland, trachea and  esophagus demonstrate no significant findings. The heart size is normal. Minimal pericardial fluid versus thickening is stable.Stable atherosclerosis of the aorta, great vessels and coronary arteries.  Lungs/Pleura: Minimal pleural fluid on the left.Stable perihilar radiation changes bilaterally with hilar distortion and central bronchial irregularity. There is stable associated scarring and volume loss in the right middle lobe, superior segment of the left lower lobe and lingula. 7 mm right lower lobe pulmonary nodule on image 39 is unchanged. No new or enlarging nodules identified.  Musculoskeletal/Chest wall: No suspicious chest wall lesion or osseous finding.  CT ABDOMEN AND PELVIS FINDINGS  Hepatobiliary: Scattered small low-density hepatic lesions are grossly stable, most consistent with cysts. No new or enlarging lesions identified. No evidence of gallstones, gallbladder wall thickening or biliary dilatation.  Pancreas: Unremarkable. No pancreatic ductal dilatation or surrounding inflammatory changes.  Spleen: Normal in size without focal abnormality.  Adrenals/Urinary Tract: Mild bilateral adrenal nodularity is stable. Stable cyst posteriorly in the left kidney. No hydronephrosis or asymmetric perinephric soft tissue stranding. No evidence of urinary tract calculus. The urinary bladder is decompressed by a Foley catheter. There is suspected bladder wall thickening and perivesical soft tissue stranding.  Stomach/Bowel: No evidence of bowel wall thickening, distention or surrounding inflammatory change.There are diverticular changes throughout the distal colon. The appendix appears normal.  Vascular/Lymphatic: There is progressive enlargement of multiple retroperitoneal and pelvic lymph nodes. Representative nodes include: 1.5 cm aortic caval node on image 66, 1.9 cm right paracaval node on image 69, a 2.0 cm aortic caval node on image 69 (previously 1.5 cm), a 1.2 cm left periaortic node on image 72 and a 3.8  x 2.0 cm left pelvic sidewall node on image 101 (previously 2.6 x 2.6 cm). Extensive atherosclerosis of the aorta and iliac arteries is grossly stable.  Reproductive: Grossly stable moderate enlargement of the prostate gland.  Other: No evidence of abdominal wall mass or hernia.  Musculoskeletal: There are interval surgical changes at L5 status post decompressive laminectomy. There is mildly progressive destruction of the L4 and L5 vertebral bodies with associated biforaminal narrowing at L4-5 and L5-S1. There is nonspecific fluid density within the laminectomy bed. No other osseous lesions observed.  IMPRESSION: 1. The findings in the chest are stable with paramediastinal radiation changes bilaterally and residual right hilar and mediastinal lymphadenopathy. 2. Pulmonary parenchymal scarring and nodularity are unchanged. 3. Progressive destruction of the L4 and L5 vertebral bodies status post decompressive laminectomy (adenocarcinoma at pathology). 4. Progressive retroperitoneal and pelvic adenopathy consistent with metastatic disease. 5. Nonspecific bladder wall thickening and perivesical edema. This may be secondary prior radiation therapy or cystitis.   Electronically Signed   By: Richardean Sale M.D.   On: 08/08/2014 11:58   ASSESSMENT AND PLAN: this is a very pleasant 80 years old white male with history of stage IIIB non-small cell lung cancer status post concurrent chemoradiation followed by consolidation chemotherapy. He had evidence for disease recurrence and the patient was started on systemic chemotherapy with carboplatin and Alimta discontinued secondary to disease progression.  He is currently undergoing immunotherapy with single agent Nivolumab status post 8 cycles. This was discontinued after the patient had progressive disease in the lumbar spines and require surgical resection followed by palliative radiotherapy. His recent CT scan of the Chest, Abdomen and pelvis showed a stable disease in the  chest but the patient has evidence for progressive retroperitoneal and pelvic adenopathy consistent with metastatic disease. I discussed the scan results with the patient and his wife. I gave him several options for management of his condition including resuming treatment with Nivolumab, single agent chemotherapy with gemcitabine versus palliative care and continuous observation. The patient is not interested in resuming any systemic treatment at this point and he would like to continue on observation. I will see him back for follow-up visit in 2 months for reevaluation with repeat CT scan of the chest, abdomen and pelvis for restaging of his disease. I reminded the patient of the signs of further disease progression in the abdomen including abdominal distention, abdominal pain, constipation as well as worsening fatigue and weakness. He was advised to call immediately if he has any concerning symptoms in the interval. The patient voices understanding of current disease status and treatment options and is in agreement with the current care plan.  All questions were answered. The patient knows to call the clinic with any problems, questions or concerns. We can certainly see the patient much sooner if necessary.  Disclaimer: This note was dictated with voice recognition software. Similar sounding words can inadvertently be transcribed and may not be corrected upon review.

## 2014-08-11 NOTE — Telephone Encounter (Signed)
Pt confirmed labs/ov per 03/14 POF, gave pt AVS.... KJ

## 2014-08-14 ENCOUNTER — Ambulatory Visit: Payer: Medicare Other | Admitting: Radiation Oncology

## 2014-08-15 ENCOUNTER — Ambulatory Visit
Admission: RE | Admit: 2014-08-15 | Discharge: 2014-08-15 | Disposition: A | Discharge status: Home / Self Care | Payer: Medicare Other | Source: Ambulatory Visit | Attending: Radiation Oncology | Admitting: Radiation Oncology

## 2014-08-15 ENCOUNTER — Encounter: Payer: Self-pay | Admitting: Radiation Oncology

## 2014-08-15 VITALS — BP 124/86 | HR 105 | Temp 98.0°F | Resp 16 | Wt 170.0 lb

## 2014-08-15 DIAGNOSIS — C7951 Secondary malignant neoplasm of bone: Secondary | ICD-10-CM

## 2014-08-15 NOTE — Progress Notes (Signed)
Patient able to ambulate with the aid of a 4 point cane. 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 Incontinence of bowel and bladder. Reports tolerable back pain at surgical site. States, "only occasionally do I take Aleve my back pain is tolerable for the most part." Reports difficulty sleeping related to restless legs. Reports he was given three option to manage lymph nodes in his abdomen 1. Chemo 2. Immunotherapy 3. Nothing. Patient opted to enjoy his remaining days with the quality of life he has and forego therapy.

## 2014-08-17 NOTE — Progress Notes (Signed)
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 Metastatic cancer to spine 198.5 C79.51     Interval Since Last Radiation:  4  weeks  Narrative:  The patient returns today for routine follow-up.  Patient able to ambulate with the aid of a 4 point cane. 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 Incontinence of bowel and bladder. Reports tolerable back pain at surgical site. States, "only occasionally do I take Aleve my back pain is tolerable for the most part." Reports difficulty sleeping related to restless legs. Reports he was given three option to manage lymph nodes in his abdomen 1. Chemo 2. Immunotherapy 3. Nothing. Patient opted to enjoy his remaining days with the quality of life he has and forego therapy.                              ALLERGIES:  has No Known Allergies.  Meds: Current Outpatient Prescriptions  Medication Sig Dispense Refill  . furosemide (LASIX) 20 MG tablet Take 20 mg by mouth daily.    Marland Kitchen guaiFENesin (MUCINEX) 600 MG 12 hr tablet Take 2 tablets (1,200 mg total) by mouth 2 (two) times daily as needed for cough or to loosen phlegm.    . tamsulosin (FLOMAX) 0.4 MG CAPS capsule Take 0.4 mg by mouth 2 (two) times daily.    . vitamin C (ASCORBIC ACID) 500 MG tablet Take 500 mg by mouth daily.    Marland Kitchen zinc sulfate 220 MG capsule Take 220 mg by mouth daily.    Marland Kitchen acetaminophen (TYLENOL) 325 MG tablet Take 2 tablets (650 mg total) by mouth every 4 (four) hours as needed for mild pain or fever (temp > 100.5). (Patient not taking: Reported on 08/11/2014)    . bisacodyl (DULCOLAX) 10 MG suppository Place 1 suppository (10 mg total) rectally daily as needed for severe  constipation. (Patient not taking: Reported on 08/11/2014)    . Oxycodone HCl 20 MG TABS Take 1 tablet (20 mg total) by mouth every 6 (six) hours as needed (for moderate - severe pain.). (Patient not taking: Reported on 08/15/2014) 100 tablet 0  . polyethylene glycol (MIRALAX / GLYCOLAX) packet Take 17 g by mouth daily as needed for moderate constipation. (Patient not taking: Reported on 08/11/2014)    . promethazine (PHENERGAN) 25 MG tablet Take 25 mg by mouth every 6 (six) hours as needed for nausea or vomiting.    . senna (SENOKOT) 8.6 MG TABS tablet Take 1 tablet (8.6 mg total) by mouth daily as needed for mild constipation. (Patient not taking: Reported on 08/11/2014)     No current facility-administered medications for this encounter.    Physical Findings: The patient is in no acute distress. Patient is alert and oriented.  weight is 170 lb (77.111 kg). His oral temperature is 98 F (36.7 C). His blood pressure is 124/86 and his pulse is 105. His respiration is 16 and oxygen saturation is 100%. .  No significant changes.  Lab Findings: Lab Results  Component Value Date   WBC 6.0 08/08/2014   WBC 9.4 06/03/2014   HGB 9.7* 08/08/2014   HGB 8.5* 06/03/2014  HCT 31.1* 08/08/2014   HCT 25.1* 06/03/2014   PLT 208 08/08/2014   PLT 277 06/03/2014    Lab Results  Component Value Date   NA 141 08/08/2014   NA 134* 06/03/2014   K 3.4* 08/08/2014   K 3.6 06/03/2014   CHLORIDE 104 08/08/2014   CO2 27 08/08/2014   CO2 27 06/03/2014   GLUCOSE 97 08/08/2014   GLUCOSE 112* 06/03/2014   GLUCOSE 82 10/08/2012   BUN 23.7 08/08/2014   BUN 29* 06/03/2014   CREATININE 1.1 08/08/2014   CREATININE 1.66* 06/03/2014   BILITOT 0.31 08/08/2014   BILITOT 0.5 05/28/2014   ALKPHOS 72 08/08/2014   ALKPHOS 66 05/28/2014   AST 18 08/08/2014   AST 17 05/28/2014   ALT 17 08/08/2014   ALT 15 05/28/2014   PROT 6.5 08/08/2014   PROT 6.2 05/28/2014   ALBUMIN 3.0* 08/08/2014   ALBUMIN 2.9* 05/28/2014     CALCIUM 8.8 08/08/2014   CALCIUM 8.4 06/03/2014   ANIONGAP 10 08/08/2014   ANIONGAP 6 06/03/2014    Radiographic Findings: Ct Abdomen Pelvis Wo Contrast  08/08/2014   CLINICAL DATA:  Restaging non-small cell lung cancer post chemotherapy completed 6 months ago. Persistent cough. Subsequent encounter.  EXAM: CT CHEST, ABDOMEN AND PELVIS WITHOUT CONTRAST  TECHNIQUE: Multidetector CT imaging of the chest, abdomen and pelvis was performed following the standard protocol without IV contrast.  COMPARISON:  Chest CT 05/21/2014, CT of the chest, abdomen and pelvis 03/07/2014 and PET-CT 04/23/2013. Lumbar MRI 05/24/2014.  FINDINGS: CT CHEST FINDINGS  Mediastinum/Nodes: Nodal evaluation limited by a lack of intravenous contrast and presumed radiation changes in the right perihilar region. Subcarinal node measuring 1.2 cm short axis on image 32 previously measured 1.4 cm. Right infrahilar and perihilar soft tissue fullness appears unchanged. No progressive adenopathy identified. The thyroid gland, trachea and esophagus demonstrate no significant findings. The heart size is normal. Minimal pericardial fluid versus thickening is stable.Stable atherosclerosis of the aorta, great vessels and coronary arteries.  Lungs/Pleura: Minimal pleural fluid on the left.Stable perihilar radiation changes bilaterally with hilar distortion and central bronchial irregularity. There is stable associated scarring and volume loss in the right middle lobe, superior segment of the left lower lobe and lingula. 7 mm right lower lobe pulmonary nodule on image 39 is unchanged. No new or enlarging nodules identified.  Musculoskeletal/Chest wall: No suspicious chest wall lesion or osseous finding.  CT ABDOMEN AND PELVIS FINDINGS  Hepatobiliary: Scattered small low-density hepatic lesions are grossly stable, most consistent with cysts. No new or enlarging lesions identified. No evidence of gallstones, gallbladder wall thickening or biliary  dilatation.  Pancreas: Unremarkable. No pancreatic ductal dilatation or surrounding inflammatory changes.  Spleen: Normal in size without focal abnormality.  Adrenals/Urinary Tract: Mild bilateral adrenal nodularity is stable. Stable cyst posteriorly in the left kidney. No hydronephrosis or asymmetric perinephric soft tissue stranding. No evidence of urinary tract calculus. The urinary bladder is decompressed by a Foley catheter. There is suspected bladder wall thickening and perivesical soft tissue stranding.  Stomach/Bowel: No evidence of bowel wall thickening, distention or surrounding inflammatory change.There are diverticular changes throughout the distal colon. The appendix appears normal.  Vascular/Lymphatic: There is progressive enlargement of multiple retroperitoneal and pelvic lymph nodes. Representative nodes include: 1.5 cm aortic caval node on image 66, 1.9 cm right paracaval node on image 69, a 2.0 cm aortic caval node on image 69 (previously 1.5 cm), a 1.2 cm left periaortic node on image 72 and a 3.8 x  2.0 cm left pelvic sidewall node on image 101 (previously 2.6 x 2.6 cm). Extensive atherosclerosis of the aorta and iliac arteries is grossly stable.  Reproductive: Grossly stable moderate enlargement of the prostate gland.  Other: No evidence of abdominal wall mass or hernia.  Musculoskeletal: There are interval surgical changes at L5 status post decompressive laminectomy. There is mildly progressive destruction of the L4 and L5 vertebral bodies with associated biforaminal narrowing at L4-5 and L5-S1. There is nonspecific fluid density within the laminectomy bed. No other osseous lesions observed.  IMPRESSION: 1. The findings in the chest are stable with paramediastinal radiation changes bilaterally and residual right hilar and mediastinal lymphadenopathy. 2. Pulmonary parenchymal scarring and nodularity are unchanged. 3. Progressive destruction of the L4 and L5 vertebral bodies status post  decompressive laminectomy (adenocarcinoma at pathology). 4. Progressive retroperitoneal and pelvic adenopathy consistent with metastatic disease. 5. Nonspecific bladder wall thickening and perivesical edema. This may be secondary prior radiation therapy or cystitis.   Electronically Signed   By: Richardean Sale M.D.   On: 08/08/2014 11:58   Ct Chest Wo Contrast  08/08/2014   CLINICAL DATA:  Restaging non-small cell lung cancer post chemotherapy completed 6 months ago. Persistent cough. Subsequent encounter.  EXAM: CT CHEST, ABDOMEN AND PELVIS WITHOUT CONTRAST  TECHNIQUE: Multidetector CT imaging of the chest, abdomen and pelvis was performed following the standard protocol without IV contrast.  COMPARISON:  Chest CT 05/21/2014, CT of the chest, abdomen and pelvis 03/07/2014 and PET-CT 04/23/2013. Lumbar MRI 05/24/2014.  FINDINGS: CT CHEST FINDINGS  Mediastinum/Nodes: Nodal evaluation limited by a lack of intravenous contrast and presumed radiation changes in the right perihilar region. Subcarinal node measuring 1.2 cm short axis on image 32 previously measured 1.4 cm. Right infrahilar and perihilar soft tissue fullness appears unchanged. No progressive adenopathy identified. The thyroid gland, trachea and esophagus demonstrate no significant findings. The heart size is normal. Minimal pericardial fluid versus thickening is stable.Stable atherosclerosis of the aorta, great vessels and coronary arteries.  Lungs/Pleura: Minimal pleural fluid on the left.Stable perihilar radiation changes bilaterally with hilar distortion and central bronchial irregularity. There is stable associated scarring and volume loss in the right middle lobe, superior segment of the left lower lobe and lingula. 7 mm right lower lobe pulmonary nodule on image 39 is unchanged. No new or enlarging nodules identified.  Musculoskeletal/Chest wall: No suspicious chest wall lesion or osseous finding.  CT ABDOMEN AND PELVIS FINDINGS  Hepatobiliary:  Scattered small low-density hepatic lesions are grossly stable, most consistent with cysts. No new or enlarging lesions identified. No evidence of gallstones, gallbladder wall thickening or biliary dilatation.  Pancreas: Unremarkable. No pancreatic ductal dilatation or surrounding inflammatory changes.  Spleen: Normal in size without focal abnormality.  Adrenals/Urinary Tract: Mild bilateral adrenal nodularity is stable. Stable cyst posteriorly in the left kidney. No hydronephrosis or asymmetric perinephric soft tissue stranding. No evidence of urinary tract calculus. The urinary bladder is decompressed by a Foley catheter. There is suspected bladder wall thickening and perivesical soft tissue stranding.  Stomach/Bowel: No evidence of bowel wall thickening, distention or surrounding inflammatory change.There are diverticular changes throughout the distal colon. The appendix appears normal.  Vascular/Lymphatic: There is progressive enlargement of multiple retroperitoneal and pelvic lymph nodes. Representative nodes include: 1.5 cm aortic caval node on image 66, 1.9 cm right paracaval node on image 69, a 2.0 cm aortic caval node on image 69 (previously 1.5 cm), a 1.2 cm left periaortic node on image 72 and a 3.8 x  2.0 cm left pelvic sidewall node on image 101 (previously 2.6 x 2.6 cm). Extensive atherosclerosis of the aorta and iliac arteries is grossly stable.  Reproductive: Grossly stable moderate enlargement of the prostate gland.  Other: No evidence of abdominal wall mass or hernia.  Musculoskeletal: There are interval surgical changes at L5 status post decompressive laminectomy. There is mildly progressive destruction of the L4 and L5 vertebral bodies with associated biforaminal narrowing at L4-5 and L5-S1. There is nonspecific fluid density within the laminectomy bed. No other osseous lesions observed.  IMPRESSION: 1. The findings in the chest are stable with paramediastinal radiation changes bilaterally and  residual right hilar and mediastinal lymphadenopathy. 2. Pulmonary parenchymal scarring and nodularity are unchanged. 3. Progressive destruction of the L4 and L5 vertebral bodies status post decompressive laminectomy (adenocarcinoma at pathology). 4. Progressive retroperitoneal and pelvic adenopathy consistent with metastatic disease. 5. Nonspecific bladder wall thickening and perivesical edema. This may be secondary prior radiation therapy or cystitis.   Electronically Signed   By: Richardean Sale M.D.   On: 08/08/2014 11:58    Impression:  The patient is recovering from the effects of radiation.    Plan:  Follow-up as needed.  _____________________________________  Sheral Apley Tammi Klippel, M.D.

## 2014-08-22 ENCOUNTER — Telehealth: Payer: Self-pay | Admitting: *Deleted

## 2014-08-22 NOTE — Telephone Encounter (Signed)
Carrie in [place] called to say that she attempted to schedule the CT scans that were ordered for this patient for May, 2016.  He refused to have those scheduled.  He said he is refusing treatment and will refuse all scans, also.  She wanted Dr. Julien Nordmann to be aware.

## 2014-08-25 NOTE — Telephone Encounter (Addendum)
I called pt and spoke to his wife - pt was heard in background that he does not want to keep his appointments in may for labs and appt with [facility]. Note sent to scheduler. I told his wife to please call back if he needs anything.

## 2014-08-26 ENCOUNTER — Telehealth: Payer: Self-pay | Admitting: Internal Medicine

## 2014-08-26 NOTE — Telephone Encounter (Signed)
called pt and he requested that he did not want to keep his appts and do not resched

## 2014-09-01 ENCOUNTER — Telehealth: Payer: Self-pay | Admitting: Internal Medicine

## 2014-09-01 NOTE — Telephone Encounter (Signed)
pt called to r/s cx appt....pt ok and aware of date and time

## 2014-09-16 ENCOUNTER — Telehealth: Payer: Self-pay | Admitting: Radiation Oncology

## 2014-09-16 ENCOUNTER — Other Ambulatory Visit: Payer: Medicare Other

## 2014-09-16 NOTE — Telephone Encounter (Signed)
Phoned patient at home to inquire of needs. Wife answered the phone. She reports her husband is out and about. 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 She denies any needs at this time or desire for hospice care. Understands to contact this RN with future needs.

## 2014-09-29 ENCOUNTER — Ambulatory Visit: Payer: Medicare Other | Admitting: Internal Medicine

## 2014-10-16 ENCOUNTER — Other Ambulatory Visit (HOSPITAL_BASED_OUTPATIENT_CLINIC_OR_DEPARTMENT_OTHER): Payer: Medicare Other

## 2014-10-16 ENCOUNTER — Other Ambulatory Visit: Payer: Medicare Other

## 2014-10-16 ENCOUNTER — Ambulatory Visit (HOSPITAL_COMMUNITY)
Admission: RE | Admit: 2014-10-16 | Discharge: 2014-10-16 | Disposition: A | Discharge status: Home / Self Care | Payer: Medicare Other | Source: Ambulatory Visit | Attending: Internal Medicine | Admitting: Internal Medicine

## 2014-10-16 DIAGNOSIS — C3411 Malignant neoplasm of upper lobe, right bronchus or lung: Secondary | ICD-10-CM

## 2014-10-16 DIAGNOSIS — N289 Disorder of kidney and ureter, unspecified: Secondary | ICD-10-CM | POA: Insufficient documentation

## 2014-10-16 DIAGNOSIS — C7951 Secondary malignant neoplasm of bone: Secondary | ICD-10-CM

## 2014-10-16 DIAGNOSIS — M545 Low back pain: Secondary | ICD-10-CM | POA: Diagnosis not present

## 2014-10-16 DIAGNOSIS — Z85118 Personal history of other malignant neoplasm of bronchus and lung: Secondary | ICD-10-CM | POA: Diagnosis not present

## 2014-10-16 DIAGNOSIS — I714 Abdominal aortic aneurysm, without rupture: Secondary | ICD-10-CM | POA: Diagnosis not present

## 2014-10-16 DIAGNOSIS — C3432 Malignant neoplasm of lower lobe, left bronchus or lung: Secondary | ICD-10-CM

## 2014-10-16 LAB — COMPREHENSIVE METABOLIC PANEL (CC13)
ALT: 13 U/L (ref 0–55)
ANION GAP: 13 meq/L — AB (ref 3–11)
AST: 19 U/L (ref 5–34)
Albumin: 3.9 g/dL (ref 3.5–5.0)
Alkaline Phosphatase: 104 U/L (ref 40–150)
BILIRUBIN TOTAL: 0.33 mg/dL (ref 0.20–1.20)
BUN: 25.9 mg/dL (ref 7.0–26.0)
CHLORIDE: 102 meq/L (ref 98–109)
CO2: 22 meq/L (ref 22–29)
CREATININE: 1.8 mg/dL — AB (ref 0.7–1.3)
Calcium: 9.4 mg/dL (ref 8.4–10.4)
EGFR: 36 mL/min/{1.73_m2} — ABNORMAL LOW (ref >90)
Glucose: 108 mg/dl (ref 70–140)
Potassium: 5 mEq/L (ref 3.5–5.1)
Sodium: 136 mEq/L (ref 136–145)
Total Protein: 7.7 g/dL (ref 6.4–8.3)

## 2014-10-16 LAB — CBC WITH DIFFERENTIAL/PLATELET
BASO%: 0.8 % (ref 0.0–2.0)
Basophils Absolute: 0.1 10*3/uL (ref 0.0–0.1)
EOS%: 3.9 % (ref 0.0–7.0)
Eosinophils Absolute: 0.3 10*3/uL (ref 0.0–0.5)
HEMATOCRIT: 41.2 % (ref 38.4–49.9)
HEMOGLOBIN: 13.6 g/dL (ref 13.0–17.1)
LYMPH#: 0.8 10*3/uL — AB (ref 0.9–3.3)
LYMPH%: 10.9 % — ABNORMAL LOW (ref 14.0–49.0)
MCH: 31.3 pg (ref 27.2–33.4)
MCHC: 33 g/dL (ref 32.0–36.0)
MCV: 95 fL (ref 79.3–98.0)
MONO#: 0.5 10*3/uL (ref 0.1–0.9)
MONO%: 6.7 % (ref 0.0–14.0)
NEUT#: 6 10*3/uL (ref 1.5–6.5)
NEUT%: 77.7 % — ABNORMAL HIGH (ref 39.0–75.0)
Platelets: 254 10*3/uL (ref 140–400)
RBC: 4.33 10*6/uL (ref 4.20–5.82)
RDW: 15.3 % — ABNORMAL HIGH (ref 11.0–14.6)
WBC: 7.7 10*3/uL (ref 4.0–10.3)

## 2014-10-16 MED ORDER — IOHEXOL 300 MG/ML  SOLN
50.0000 mL | Freq: Once | INTRAMUSCULAR | Status: AC | PRN
  Administered 2014-10-16: 50 mL via ORAL

## 2014-10-16 MED ORDER — IOHEXOL 300 MG/ML  SOLN
100.0000 mL | Freq: Once | INTRAMUSCULAR | Status: AC | PRN
  Administered 2014-10-16: 80 mL via INTRAVENOUS

## 2014-10-20 ENCOUNTER — Ambulatory Visit (HOSPITAL_BASED_OUTPATIENT_CLINIC_OR_DEPARTMENT_OTHER): Payer: Medicare Other | Admitting: Internal Medicine

## 2014-10-20 ENCOUNTER — Encounter: Payer: Self-pay | Admitting: Internal Medicine

## 2014-10-20 ENCOUNTER — Telehealth: Payer: Self-pay | Admitting: Internal Medicine

## 2014-10-20 VITALS — BP 137/85 | HR 100 | Temp 98.1°F | Resp 18 | Ht 66.5 in | Wt 170.8 lb

## 2014-10-20 DIAGNOSIS — C7951 Secondary malignant neoplasm of bone: Secondary | ICD-10-CM

## 2014-10-20 DIAGNOSIS — C3432 Malignant neoplasm of lower lobe, left bronchus or lung: Secondary | ICD-10-CM

## 2014-10-20 DIAGNOSIS — C3411 Malignant neoplasm of upper lobe, right bronchus or lung: Secondary | ICD-10-CM

## 2014-10-20 NOTE — Progress Notes (Signed)
.      [place] [phone]   [phone]  OFFICE PROGRESS NOTE  [name], MD [place] [address]  DIAGNOSIS: Stage IIIB (T2a., N3, M0) non-small cell lung cancer consistent with invasive adenocarcinoma with negative ALK gene translocation and negative EGFR mutation diagnosed in October of 2013   PRIOR THERAPY:  1) Concurrent chemoradiation with weekly carboplatin for AUC of 2 and paclitaxel 45 mg/M2, last dose was given 06/11/2012 with partial response.  2) Consolidation chemotherapy with carboplatin for AUC of 5 and Alimta 500 mg/M2 every 3 weeks. Status post 3 cycles last dose was given 09/12/2012 with stable disease.  3) Systemic chemotherapy again with carboplatin for AUC of 5 and Alimta 500 mg/M2 every 3 weeks. First cycle expected on 11/13/2013. Status post 3 cycles, discontinued secondary to disease progression. Carboplatin was discontinued starting from cycle #2 secondary to hypersensitivity reaction. 4) Immunotherapy with single agent Nivolumab 3 MG/TG every 2 weeks status post 8 cycles.  5) Decompressive lumbar laminectomies from L4 to S1 for removal of metastatic tumor bilateral under the care of Dr. Saintclair Halsted on 05/27/2014. 6) status post palliative radiotherapy to the L4-S1 joint region of the lower spine for a total dose of 75 GYN he completed on 07/10/2014.  CURRENT THERAPY: None.  CHEMOTHERAPY INTENT: Palliative  CURRENT # OF CHEMOTHERAPY CYCLES: 0  CURRENT ANTIEMETICS: N/A  CURRENT SMOKING STATUS: Former Smoker  ORAL CHEMOTHERAPY AND CONSENT: None  CURRENT BISPHOSPHONATES USE: None  PAIN MANAGEMENT: 0/10  NARCOTICS INDUCED CONSTIPATION: None  LIVING WILL AND CODE STATUS: no code Blue  INTERVAL HISTORY: Logan Russell 80 y.o. male returns to the clinic today for follow-up visit accompanied by his wife and son. The patient is doing much better today except for weakness in the lower extremities.  He also continues to have low back pain but he rates his pain as 3 on a scale from 1-10. He denied having any nausea or vomiting, no fever or chills, no diarrhea or skin rash. The patient denied having any significant weight loss or night sweats. He denied having any significant chest pain but continues to have shortness of breath with exertion with no cough or hemoptysis. He had repeat CT scan of the chest, abdomen and pelvis performed recently and he is here for evaluation and discussion of his scan results.  MEDICAL HISTORY: Past Medical History  Diagnosis Date  . Hypertension   . History of radiation therapy 05/02/12-06/19/12    left lung mediastinal lymph nodes 66Gy/65f  . Lung cancer 04/12/2012  . Cauda equina syndrome   . 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  MEDICATIONS:  Current Outpatient Prescriptions  Medication Sig Dispense Refill  . acetaminophen (TYLENOL) 325 MG tablet Take 2 tablets (650 mg total) by mouth every 4 (four) hours as needed for mild pain or fever (temp > 100.5).    . bisacodyl (DULCOLAX) 10 MG suppository Place 1 suppository (10 mg total) rectally daily as needed for severe constipation.    . Oxycodone HCl 20 MG TABS Take 1 tablet (20 mg total) by mouth every 6 (six) hours as needed (for moderate - severe pain.). 100 tablet 0  . senna (SENOKOT) 8.6 MG TABS tablet Take 1 tablet (8.6 mg total) by mouth daily as needed for mild constipation.    .Marland KitchenguaiFENesin (MUCINEX) 600 MG 12 hr tablet Take  2 tablets (1,200 mg total) by mouth 2 (two) times daily as needed for cough or to loosen phlegm. (Patient not taking: Reported on 10/20/2014)    . polyethylene glycol (MIRALAX / GLYCOLAX) packet Take 17 g by mouth daily as needed for moderate constipation. (Patient not taking: Reported on 08/11/2014)    . promethazine (PHENERGAN) 25 MG tablet Take 25 mg by mouth every 6 (six) hours as needed for nausea or vomiting.      No current facility-administered medications for this visit.    REVIEW OF SYSTEMS:  A comprehensive review of systems was negative except for: Constitutional: positive for fatigue Musculoskeletal: positive for back pain   PHYSICAL EXAMINATION: General appearance: alert, cooperative, fatigued and no distress Head: Normocephalic, without obvious abnormality, atraumatic Neck: no adenopathy Lymph nodes: Cervical, supraclavicular, and axillary nodes normal. Resp: clear to auscultation bilaterally Back: symmetric, no curvature. ROM normal. No CVA tenderness. Cardio: regular rate and rhythm, S1, S2 normal, no murmur, click, rub or gallop GI: soft, non-tender; bowel sounds normal; no masses,  no organomegaly Extremities: extremities normal, atraumatic, no cyanosis or edema Neurologic: Alert and oriented X 3, normal strength and tone. Normal symmetric reflexes. Normal coordination and gait  ECOG PERFORMANCE STATUS: 2 - Symptomatic, <50% confined to bed  Blood pressure 137/85, pulse 100, temperature 98.1 F (36.7 C), temperature source Oral, resp. rate 18, height 5' 6.5" (1.689 m), weight 170 lb 12.8 oz (77.474 kg), SpO2 100 %.  LABORATORY DATA: Lab Results  Component Value Date   WBC 7.7 10/16/2014   HGB 13.6 10/16/2014   HCT 41.2 10/16/2014   MCV 95.0 10/16/2014   PLT 254 10/16/2014      Chemistry      Component Value Date/Time   NA 136 10/16/2014 1150   NA 134* 06/03/2014 0356   K 5.0 10/16/2014 1150   K 3.6 06/03/2014 0356   CL 101 06/03/2014 0356   CL 106 10/08/2012 1028   CO2 22 10/16/2014 1150   CO2 27 06/03/2014 0356   BUN 25.9 10/16/2014 1150   BUN 29* 06/03/2014 0356   CREATININE 1.8* 10/16/2014 1150   CREATININE 1.66* 06/03/2014 0356      Component Value Date/Time   CALCIUM 9.4 10/16/2014 1150   CALCIUM 8.4 06/03/2014 0356   ALKPHOS 104 10/16/2014 1150   ALKPHOS 66 05/28/2014 0805   AST 19 10/16/2014 1150   AST 17 05/28/2014 0805   ALT 13 10/16/2014 1150    ALT 15 05/28/2014 0805   BILITOT 0.33 10/16/2014 1150   BILITOT 0.5 05/28/2014 0805       RADIOGRAPHIC STUDIES: Ct Chest W Contrast  10/16/2014   CLINICAL DATA:  Lung cancer diagnosed in 2013. Chemotherapy 2015. Metastatic disease to the L5 vertebra diagnosed December 2015. Radiation therapy completed February 2016. Low back pain. Renal insufficiency.  EXAM: CT CHEST, ABDOMEN, AND PELVIS WITH CONTRAST  TECHNIQUE: Multidetector CT imaging of the chest, abdomen and pelvis was performed following the standard protocol during bolus administration of intravenous contrast.  CONTRAST:  40m OMNIPAQUE IOHEXOL 300 MG/ML SOLN. Reduced dose used due to renal insufficiency.  COMPARISON:  Multiple exams, including 08/08/2014  FINDINGS: CT CHEST FINDINGS  Mediastinum/Nodes: Right upper paratracheal node 0.8 cm in short axis on image 13 series 2, previously the same.  Lower right paratracheal adenopathy 1.3 cm in short axis on image 25 series 2, formerly the same.  Confluent right hilar adenopathy observed, with a 1.5 cm right hilar lymph node. Officially similar to prior although difficult  to compare given the lack of IV contrast on prior.  Soft tissue density encases the right upper lobe bronchus and most of the bronchus intermedius.  Coronary artery, aortic, and branch vessel atherosclerotic calcification.  Right supraclavicular lymph node 1.0 cm in short axis on image 2 series 2, formerly not included on imaging.  Ascending thoracic aorta 4.2 cm diameter.  Lungs/Pleura: Right apical pleural parenchymal scarring. Emphysema. Bilateral perihilar radiation pneumonitis causing air bronchograms the rib. Volume loss in the right middle lobe, stable. Medial volume loss in both lower lobes, stable. Density surrounding the segmental bronchi in the perihilar regions, stable.  Stable subpleural nodular density 0.5 by 0.7 cm on image 42 series 4. No new pulmonary nodules or significant progression in the chest.  Musculoskeletal:  Unremarkable  CT ABDOMEN PELVIS FINDINGS  Hepatobiliary: Stable 1.0 by 0.6 cm hypodense lesion in segment 3 of the liver, image 54 series 2. Other hypodense liver lesions posteriorly in the right hepatic lobe appear stable and are probably cysts.  Pancreas: Unremarkable  Spleen: Unremarkable  Adrenals/Urinary Tract: Small bilateral hypodense renal lesions are likely cysts although technically nonspecific due to small size. A Foley catheter is present in the decompressed urinary bladder.  Stomach/Bowel: Periampullary duodenal diverticulum does not appear inflamed. Prominent stool throughout the colon favors constipation. Scattered descending and sigmoid colon diverticular present. Left-sided soft tissue prominence at the anus, thought to probably be due to the laxity of the right pelvic floor.  Vascular/Lymphatic: Pathologic retroperitoneal adenopathy is again observed. An index anterior aortocaval lymph node on image 71 series 2 measures 2.1 cm in short axis, previously 2.0 cm by my measurements. An adjacent right eccentric retroperitoneal node measures 2.1 cm on image 71 series 2, previously 1.9 cm. A right periaortic lymph node just above the bifurcation measures 1.3 cm in short axis on image 82 series 2, previously 1.7 cm by my measurements. A left external iliac node on image 103 series 2 measures 1.9 cm in short axis, previously 1.5 cm. Other lymph nodes are present especially in the retroperitoneum.  Aortoiliac atherosclerotic vascular disease. Chronic mural thrombus is present along a small infrarenal abdominal aortic aneurysm measuring 3.1 cm transverse.  Reproductive: Mildly prominent prostate gland.  Other: Low-level presacral edema, stable.  Musculoskeletal: Degenerative arthropathy of both hips.  Extensive sclerotic osseous metastatic disease at L5 similar to prior, involving the entire remaining vertebral body, questionable discontinuity along the left pedicle, mildly progressive anterior fragmentation.  Inferior endplate sclerosis at L4 with left eccentric lucencies which may be Schmorl' s nodes although early malignant involvement is not excluded. Prior laminectomy at L5. Sclerosis along the superior endplate of S1.  New bilateral sacral insufficiency fractures extending into the S1 foramina, as shown on image 93 of series 2. Transverse fracture of the sacrum visible with the S2 level with bony discontinuity on image 56 series 603.  IMPRESSION: 1. Although some retroperitoneal nodes are larger in summer smaller, overall there is minimal reduction in the pathologic abdominal and pelvic retroperitoneal adenopathy. The thoracic adenopathy is stable. 2. New insufficiency fracture the sacrum, involving both sacral ala and with horizontal component extending across the sacrum at the S2 level. 3. Slightly increased anterior fragmentation involving the at L5 vertebra which is diffusely infiltrated by tumor. 4. Adenopathy in the chest is stable. 5. Ascending thoracic aortic aneurysm. Recommend annual imaging followup by CTA or MRA. This recommendation follows 2010 ACCF/AHA/AATS/ACR/ASA/SCA/SCAI/SIR/STS/SVM Guidelines for the Diagnosis and Management of Patients with Thoracic Aortic Disease. Circulation. 2010; 121: A250-N397. 6.  Small infrarenal abdominal aortic aneurysm, 3.1 cm in transverse dimension. Recommend followup by ultrasound in 3 years. This recommendation follows ACR consensus guidelines: White Paper of the ACR Incidental Findings Committee II on Vascular Findings. J Am Coll Radiol 2013; 62:952-841 7. Stable degree of perihilar radiation pneumonitis. 8. Emphysema. 9.  Prominent stool throughout the colon favors constipation.   Electronically Signed   By: Van Clines M.D.   On: 10/16/2014 16:17   Ct Abdomen Pelvis W Contrast  10/16/2014   CLINICAL DATA:  Lung cancer diagnosed in 2013. Chemotherapy 2015. Metastatic disease to the L5 vertebra diagnosed December 2015. Radiation therapy completed February  2016. Low back pain. Renal insufficiency.  EXAM: CT CHEST, ABDOMEN, AND PELVIS WITH CONTRAST  TECHNIQUE: Multidetector CT imaging of the chest, abdomen and pelvis was performed following the standard protocol during bolus administration of intravenous contrast.  CONTRAST:  19m OMNIPAQUE IOHEXOL 300 MG/ML SOLN. Reduced dose used due to renal insufficiency.  COMPARISON:  Multiple exams, including 08/08/2014  FINDINGS: CT CHEST FINDINGS  Mediastinum/Nodes: Right upper paratracheal node 0.8 cm in short axis on image 13 series 2, previously the same.  Lower right paratracheal adenopathy 1.3 cm in short axis on image 25 series 2, formerly the same.  Confluent right hilar adenopathy observed, with a 1.5 cm right hilar lymph node. Officially similar to prior although difficult to compare given the lack of IV contrast on prior.  Soft tissue density encases the right upper lobe bronchus and most of the bronchus intermedius.  Coronary artery, aortic, and branch vessel atherosclerotic calcification.  Right supraclavicular lymph node 1.0 cm in short axis on image 2 series 2, formerly not included on imaging.  Ascending thoracic aorta 4.2 cm diameter.  Lungs/Pleura: Right apical pleural parenchymal scarring. Emphysema. Bilateral perihilar radiation pneumonitis causing air bronchograms the rib. Volume loss in the right middle lobe, stable. Medial volume loss in both lower lobes, stable. Density surrounding the segmental bronchi in the perihilar regions, stable.  Stable subpleural nodular density 0.5 by 0.7 cm on image 42 series 4. No new pulmonary nodules or significant progression in the chest.  Musculoskeletal: Unremarkable  CT ABDOMEN PELVIS FINDINGS  Hepatobiliary: Stable 1.0 by 0.6 cm hypodense lesion in segment 3 of the liver, image 54 series 2. Other hypodense liver lesions posteriorly in the right hepatic lobe appear stable and are probably cysts.  Pancreas: Unremarkable  Spleen: Unremarkable  Adrenals/Urinary Tract:  Small bilateral hypodense renal lesions are likely cysts although technically nonspecific due to small size. A Foley catheter is present in the decompressed urinary bladder.  Stomach/Bowel: Periampullary duodenal diverticulum does not appear inflamed. Prominent stool throughout the colon favors constipation. Scattered descending and sigmoid colon diverticular present. Left-sided soft tissue prominence at the anus, thought to probably be due to the laxity of the right pelvic floor.  Vascular/Lymphatic: Pathologic retroperitoneal adenopathy is again observed. An index anterior aortocaval lymph node on image 71 series 2 measures 2.1 cm in short axis, previously 2.0 cm by my measurements. An adjacent right eccentric retroperitoneal node measures 2.1 cm on image 71 series 2, previously 1.9 cm. A right periaortic lymph node just above the bifurcation measures 1.3 cm in short axis on image 82 series 2, previously 1.7 cm by my measurements. A left external iliac node on image 103 series 2 measures 1.9 cm in short axis, previously 1.5 cm. Other lymph nodes are present especially in the retroperitoneum.  Aortoiliac atherosclerotic vascular disease. Chronic mural thrombus is present along a small infrarenal abdominal  aortic aneurysm measuring 3.1 cm transverse.  Reproductive: Mildly prominent prostate gland.  Other: Low-level presacral edema, stable.  Musculoskeletal: Degenerative arthropathy of both hips.  Extensive sclerotic osseous metastatic disease at L5 similar to prior, involving the entire remaining vertebral body, questionable discontinuity along the left pedicle, mildly progressive anterior fragmentation. Inferior endplate sclerosis at L4 with left eccentric lucencies which may be Schmorl' s nodes although early malignant involvement is not excluded. Prior laminectomy at L5. Sclerosis along the superior endplate of S1.  New bilateral sacral insufficiency fractures extending into the S1 foramina, as shown on image 93  of series 2. Transverse fracture of the sacrum visible with the S2 level with bony discontinuity on image 56 series 603.  IMPRESSION: 1. Although some retroperitoneal nodes are larger in summer smaller, overall there is minimal reduction in the pathologic abdominal and pelvic retroperitoneal adenopathy. The thoracic adenopathy is stable. 2. New insufficiency fracture the sacrum, involving both sacral ala and with horizontal component extending across the sacrum at the S2 level. 3. Slightly increased anterior fragmentation involving the at L5 vertebra which is diffusely infiltrated by tumor. 4. Adenopathy in the chest is stable. 5. Ascending thoracic aortic aneurysm. Recommend annual imaging followup by CTA or MRA. This recommendation follows 2010 ACCF/AHA/AATS/ACR/ASA/SCA/SCAI/SIR/STS/SVM Guidelines for the Diagnosis and Management of Patients with Thoracic Aortic Disease. Circulation. 2010; 121: C789-F810. 6. Small infrarenal abdominal aortic aneurysm, 3.1 cm in transverse dimension. Recommend followup by ultrasound in 3 years. This recommendation follows ACR consensus guidelines: White Paper of the ACR Incidental Findings Committee II on Vascular Findings. J Am Coll Radiol 2013; 17:510-258 7. Stable degree of perihilar radiation pneumonitis. 8. Emphysema. 9.  Prominent stool throughout the colon favors constipation.   Electronically Signed   By: Van Clines M.D.   On: 10/16/2014 16:17   ASSESSMENT AND PLAN: this is a very pleasant 80 years old white male with metastatic non-small cell lung cancer initially diagnosed as stage IIIB non-small cell lung cancer status post concurrent chemoradiation followed by consolidation chemotherapy. He had evidence for disease recurrence and the patient was started on systemic chemotherapy with carboplatin and Alimta discontinued secondary to disease progression. He is currently undergoing immunotherapy with single agent Nivolumab status post 8 cycles. This was  discontinued after the patient had progressive disease in the lumbar spines and require surgical resection followed by palliative radiotherapy. The recent CT scan of the Chest, Abdomen and pelvis showed stable disease except for slightly increased anterior fragmentation involving the L5 vertebra. The patient is otherwise asymptomatic except for the low back pain and weakness. I discussed the scan results with the patient and his family. I recommended for him to continue on observation with repeat CT scan of the chest, abdomen and pelvis in 3 months for reevaluation of his disease. He was advised to call immediately if he has any concerning symptoms in the interval. The patient voices understanding of current disease status and treatment options and is in agreement with the current care plan.  All questions were answered. The patient knows to call the clinic with any problems, questions or concerns. We can certainly see the patient much sooner if necessary.  Disclaimer: This note was dictated with voice recognition software. Similar sounding words can inadvertently be transcribed and may not be corrected upon review.

## 2014-10-20 NOTE — Telephone Encounter (Signed)
Pt confirmed labs/ov per 05/23 POF, gave pt AVS and Calendar.... KJ

## 2014-10-23 ENCOUNTER — Ambulatory Visit: Payer: Medicare Other | Admitting: Internal Medicine

## 2014-11-14 ENCOUNTER — Telehealth: Payer: Self-pay | Admitting: Internal Medicine

## 2014-11-14 NOTE — Telephone Encounter (Signed)
Logan Russell and advised on 8.29.16 appt moved to 8.31 due to MD on pal..Marland KitchenMarland KitchenMarland Kitchenpt ok and aware

## 2014-11-21 ENCOUNTER — Telehealth: Payer: Self-pay

## 2014-11-21 NOTE — Telephone Encounter (Signed)
29 Wife called that she attempted to give her husband an enema and was unable to get any water to go in. She also saw blood dripping from anus. 8 left voice message for wife to take husband to ER.

## 2015-01-19 ENCOUNTER — Ambulatory Visit (HOSPITAL_COMMUNITY)
Admission: RE | Admit: 2015-01-19 | Discharge: 2015-01-19 | Disposition: A | Discharge status: Home / Self Care | Payer: Medicare Other | Source: Ambulatory Visit | Attending: Internal Medicine | Admitting: Internal Medicine

## 2015-01-19 ENCOUNTER — Other Ambulatory Visit: Payer: Medicare Other

## 2015-01-19 ENCOUNTER — Other Ambulatory Visit (HOSPITAL_BASED_OUTPATIENT_CLINIC_OR_DEPARTMENT_OTHER): Payer: Medicare Other

## 2015-01-19 DIAGNOSIS — C3411 Malignant neoplasm of upper lobe, right bronchus or lung: Secondary | ICD-10-CM | POA: Diagnosis present

## 2015-01-19 DIAGNOSIS — Z08 Encounter for follow-up examination after completed treatment for malignant neoplasm: Secondary | ICD-10-CM | POA: Insufficient documentation

## 2015-01-19 DIAGNOSIS — C7951 Secondary malignant neoplasm of bone: Secondary | ICD-10-CM | POA: Diagnosis not present

## 2015-01-19 DIAGNOSIS — N3289 Other specified disorders of bladder: Secondary | ICD-10-CM | POA: Insufficient documentation

## 2015-01-19 DIAGNOSIS — C3432 Malignant neoplasm of lower lobe, left bronchus or lung: Secondary | ICD-10-CM | POA: Diagnosis not present

## 2015-01-19 DIAGNOSIS — R590 Localized enlarged lymph nodes: Secondary | ICD-10-CM | POA: Insufficient documentation

## 2015-01-19 DIAGNOSIS — Z9689 Presence of other specified functional implants: Secondary | ICD-10-CM | POA: Insufficient documentation

## 2015-01-19 LAB — COMPREHENSIVE METABOLIC PANEL (CC13)
ALK PHOS: 64 U/L (ref 40–150)
ALT: 13 U/L (ref 0–55)
AST: 18 U/L (ref 5–34)
Albumin: 3.9 g/dL (ref 3.5–5.0)
Anion Gap: 13 mEq/L — ABNORMAL HIGH (ref 3–11)
BUN: 24.3 mg/dL (ref 7.0–26.0)
CALCIUM: 9.6 mg/dL (ref 8.4–10.4)
CHLORIDE: 102 meq/L (ref 98–109)
CO2: 22 mEq/L (ref 22–29)
Creatinine: 1.4 mg/dL — ABNORMAL HIGH (ref 0.7–1.3)
EGFR: 50 mL/min/{1.73_m2} — ABNORMAL LOW (ref >90)
GLUCOSE: 87 mg/dL (ref 70–140)
POTASSIUM: 4 meq/L (ref 3.5–5.1)
SODIUM: 138 meq/L (ref 136–145)
Total Bilirubin: 0.49 mg/dL (ref 0.20–1.20)
Total Protein: 6.8 g/dL (ref 6.4–8.3)

## 2015-01-19 LAB — CBC WITH DIFFERENTIAL/PLATELET
BASO%: 0.6 % (ref 0.0–2.0)
Basophils Absolute: 0 10*3/uL (ref 0.0–0.1)
EOS%: 1.3 % (ref 0.0–7.0)
Eosinophils Absolute: 0.1 10*3/uL (ref 0.0–0.5)
HCT: 38.8 % (ref 38.4–49.9)
HEMOGLOBIN: 13 g/dL (ref 13.0–17.1)
LYMPH#: 0.8 10*3/uL — AB (ref 0.9–3.3)
LYMPH%: 12.1 % — ABNORMAL LOW (ref 14.0–49.0)
MCH: 32.2 pg (ref 27.2–33.4)
MCHC: 33.5 g/dL (ref 32.0–36.0)
MCV: 96 fL (ref 79.3–98.0)
MONO#: 0.5 10*3/uL (ref 0.1–0.9)
MONO%: 8.3 % (ref 0.0–14.0)
NEUT%: 77.7 % — ABNORMAL HIGH (ref 39.0–75.0)
NEUTROS ABS: 5 10*3/uL (ref 1.5–6.5)
Platelets: 173 10*3/uL (ref 140–400)
RBC: 4.04 10*6/uL — ABNORMAL LOW (ref 4.20–5.82)
RDW: 14.5 % (ref 11.0–14.6)
WBC: 6.4 10*3/uL (ref 4.0–10.3)

## 2015-01-19 MED ORDER — IOHEXOL 300 MG/ML  SOLN
50.0000 mL | Freq: Once | INTRAMUSCULAR | Status: AC | PRN
  Administered 2015-01-19: 50 mL via ORAL

## 2015-01-26 ENCOUNTER — Ambulatory Visit: Payer: Medicare Other | Admitting: Internal Medicine

## 2015-01-28 ENCOUNTER — Encounter: Payer: Self-pay | Admitting: Internal Medicine

## 2015-01-28 ENCOUNTER — Telehealth: Payer: Self-pay | Admitting: Medical Oncology

## 2015-01-28 ENCOUNTER — Ambulatory Visit (HOSPITAL_BASED_OUTPATIENT_CLINIC_OR_DEPARTMENT_OTHER): Payer: Medicare Other | Admitting: Internal Medicine

## 2015-01-28 ENCOUNTER — Telehealth: Payer: Self-pay | Admitting: Internal Medicine

## 2015-01-28 VITALS — BP 153/75 | HR 80 | Temp 97.7°F | Resp 17 | Ht 66.5 in | Wt 167.5 lb

## 2015-01-28 DIAGNOSIS — C3432 Malignant neoplasm of lower lobe, left bronchus or lung: Secondary | ICD-10-CM | POA: Diagnosis not present

## 2015-01-28 DIAGNOSIS — C7951 Secondary malignant neoplasm of bone: Secondary | ICD-10-CM

## 2015-01-28 DIAGNOSIS — N181 Chronic kidney disease, stage 1: Secondary | ICD-10-CM

## 2015-01-28 DIAGNOSIS — C3411 Malignant neoplasm of upper lobe, right bronchus or lung: Secondary | ICD-10-CM

## 2015-01-28 NOTE — Progress Notes (Signed)
.      [place] [phone]) [phone]   [phone]  OFFICE PROGRESS NOTE  [name], MD [place] [address]  DIAGNOSIS: Stage IIIB (T2a., N3, M0) non-small cell lung cancer consistent with invasive adenocarcinoma with negative ALK gene translocation and negative EGFR mutation diagnosed in October of 2013   PRIOR THERAPY:  1) Concurrent chemoradiation with weekly carboplatin for AUC of 2 and paclitaxel 45 mg/M2, last dose was given 06/11/2012 with partial response.  2) Consolidation chemotherapy with carboplatin for AUC of 5 and Alimta 500 mg/M2 every 3 weeks. Status post 3 cycles last dose was given 09/12/2012 with stable disease.  3) Systemic chemotherapy again with carboplatin for AUC of 5 and Alimta 500 mg/M2 every 3 weeks. First cycle expected on 11/13/2013. Status post 3 cycles, discontinued secondary to disease progression. Carboplatin was discontinued starting from cycle #2 secondary to hypersensitivity reaction. 4) Immunotherapy with single agent Nivolumab 3 MG/TG every 2 weeks status post 8 cycles.  5) Decompressive lumbar laminectomies from L4 to S1 for removal of metastatic tumor bilateral under the care of Dr. Saintclair Halsted on 05/27/2014. 6) status post palliative radiotherapy to the L4-S1 joint region of the lower spine for a total dose of 75 GYN he completed on 07/10/2014.  CURRENT THERAPY: Observation.  CHEMOTHERAPY INTENT: Palliative  CURRENT # OF CHEMOTHERAPY CYCLES: 0  CURRENT ANTIEMETICS: N/A  CURRENT SMOKING STATUS: Former Smoker  ORAL CHEMOTHERAPY AND CONSENT: None  CURRENT BISPHOSPHONATES USE: None  PAIN MANAGEMENT: 0/10  NARCOTICS INDUCED CONSTIPATION: None  LIVING WILL AND CODE STATUS: no code Blue  INTERVAL HISTORY: Logan Russell 80 y.o. male returns to the clinic today for follow-up visit accompanied by his wife and son. The patient is doing much better today except for weakness in the lower  extremities. He also continues to have low back pain as well as right hip pain. He is currently receiving his pain medication from his primary care physician Dr. Arelia Sneddon. He would like to have a walker at home to help with his mobility. He denied having any nausea or vomiting, no fever or chills, no diarrhea or skin rash. The patient denied having any significant weight loss or night sweats. He denied having any significant chest pain, shortness of breath, cough or hemoptysis. He had repeat CT scan of the chest, abdomen and pelvis performed recently and he is here for evaluation and discussion of his scan results.  MEDICAL HISTORY: Past Medical History  Diagnosis Date  . Hypertension   . History of radiation therapy 05/02/12-06/19/12    left lung mediastinal lymph nodes 66Gy/67fx  . Lung cancer 04/12/2012  . Cauda equina syndrome   . 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  MEDICATIONS:  Current Outpatient Prescriptions  Medication Sig Dispense Refill  . acetaminophen (TYLENOL) 325 MG tablet Take 2 tablets (650 mg total) by mouth every 4 (four) hours as needed for mild pain or fever (temp > 100.5).    Marland Kitchen guaiFENesin (MUCINEX) 600 MG 12 hr tablet Take 2 tablets (1,200 mg total) by mouth 2 (two) times daily as needed for cough or to loosen phlegm.    . Oxycodone HCl 10 MG TABS Take 10 mg by mouth every 4 (four) hours as needed. for pain  0  . polyethylene glycol (MIRALAX / GLYCOLAX) packet Take 17 g by mouth daily as needed for moderate  constipation.    . bisacodyl (DULCOLAX) 10 MG suppository Place 1 suppository (10 mg total) rectally daily as needed for severe constipation. (Patient not taking: Reported on 01/28/2015)    . promethazine (PHENERGAN) 25 MG tablet Take 25 mg by mouth every 6 (six) hours as needed for nausea or vomiting.    . senna (SENOKOT) 8.6 MG TABS tablet Take 1 tablet (8.6 mg total) by mouth daily as needed for mild  constipation. (Patient not taking: Reported on 01/28/2015)     No current facility-administered medications for this visit.    REVIEW OF SYSTEMS:  A comprehensive review of systems was negative except for: Constitutional: positive for fatigue Musculoskeletal: positive for back pain   PHYSICAL EXAMINATION: General appearance: alert, cooperative, fatigued and no distress Head: Normocephalic, without obvious abnormality, atraumatic Neck: no adenopathy Lymph nodes: Cervical, supraclavicular, and axillary nodes normal. Resp: clear to auscultation bilaterally Back: symmetric, no curvature. ROM normal. No CVA tenderness. Cardio: regular rate and rhythm, S1, S2 normal, no murmur, click, rub or gallop GI: soft, non-tender; bowel sounds normal; no masses,  no organomegaly Extremities: extremities normal, atraumatic, no cyanosis or edema Neurologic: Alert and oriented X 3, normal strength and tone. Normal symmetric reflexes. Normal coordination and gait  ECOG PERFORMANCE STATUS: 2 - Symptomatic, <50% confined to bed  Blood pressure 153/75, pulse 80, temperature 97.7 F (36.5 C), temperature source Oral, resp. rate 17, height 5' 6.5" (1.689 m), weight 167 lb 8 oz (75.978 kg), SpO2 100 %.  LABORATORY DATA: Lab Results  Component Value Date   WBC 6.4 01/19/2015   HGB 13.0 01/19/2015   HCT 38.8 01/19/2015   MCV 96.0 01/19/2015   PLT 173 01/19/2015      Chemistry      Component Value Date/Time   NA 138 01/19/2015 1427   NA 134* 06/03/2014 0356   K 4.0 01/19/2015 1427   K 3.6 06/03/2014 0356   CL 101 06/03/2014 0356   CL 106 10/08/2012 1028   CO2 22 01/19/2015 1427   CO2 27 06/03/2014 0356   BUN 24.3 01/19/2015 1427   BUN 29* 06/03/2014 0356   CREATININE 1.4* 01/19/2015 1427   CREATININE 1.66* 06/03/2014 0356      Component Value Date/Time   CALCIUM 9.6 01/19/2015 1427   CALCIUM 8.4 06/03/2014 0356   ALKPHOS 64 01/19/2015 1427   ALKPHOS 66 05/28/2014 0805   AST 18 01/19/2015 1427    AST 17 05/28/2014 0805   ALT 13 01/19/2015 1427   ALT 15 05/28/2014 0805   BILITOT 0.49 01/19/2015 1427   BILITOT 0.5 05/28/2014 0805       RADIOGRAPHIC STUDIES: Ct Abdomen Pelvis Wo Contrast  01/19/2015   CLINICAL DATA:  metastatic lung cancer. Cough. Chemotherapy and radiation therapy complete. Restaging. Bone metastasis.  EXAM: CT CHEST, ABDOMEN AND PELVIS WITHOUT CONTRAST  TECHNIQUE: Multidetector CT imaging of the chest, abdomen and pelvis was performed following the standard protocol without IV contrast.  COMPARISON:  10/16/2014.  Clinic note 10/20/2014.  FINDINGS: CT CHEST FINDINGS  Mediastinum/Nodes: Right low jugular/ supraclavicular node measures 11 mm on image 3 versus 10 mm on the prior. Mild left-sided gynecomastia. Tortuous thoracic aorta with atherosclerosis within. Mild cardiomegaly with multivessel coronary artery atherosclerosis. High right paratracheal node measures 8 mm on image 13 and is unchanged.  A low right paratracheal/ precarinal node measures 1.2 cm on image 25 versus 1.3 cm on the prior.  Hilar regions poorly evaluated without intravenous contrast.  Lungs/Pleura: No pleural fluid. Moderate  centrilobular emphysema. Similar narrowing of right-sided endobronchial tree, likely treatment related.  Right lower lobe nodularity, including at 5 mm on image 41. This is unchanged. Probable scarring in the right lower lobe on images 29 and 30. The more inferior and medial component on image 30 is somewhat nodular, measuring 6 mm today versus 4 mm on the prior.  Similar volume loss the left lung base. Similar configuration of paramediastinal fibrosis bilaterally.  Musculoskeletal: No acute osseous abnormality.  CT ABDOMEN AND PELVIS FINDINGS  Hepatobiliary: Scattered well-circumscribed low-density liver lesions which are favored to represent cysts. Normal gallbladder, without biliary ductal dilatation. Periampullary duodenal diverticulum incidentally noted.  Pancreas: Normal, without  mass or ductal dilatation.  Spleen: Normal  Adrenals/Urinary Tract: Normal adrenal glands. Fluid density left renal lesion is likely a cyst. No renal calculi or hydronephrosis. No hydroureter or ureteric calculi. Foley catheter within the urinary bladder, which is decompressed partially. Apparent wall thickening is nonspecific and similar to improved.  Stomach/Bowel: Normal stomach, without wall thickening. Normal colon and terminal ileum. Normal small bowel.  Vascular/Lymphatic: Non aneurysmal infrarenal aortic dilatation. 2.8 cm. Wall thrombus within.  Extensive retroperitoneal adenopathy. A retrocaval node measures 2.2 cm on image 69 versus 2.1 cm on the prior.  Index left periaortic node measures 1.2 cm on image 72 versus 1.4 cm on the prior.  Preaortic/pre caval node measures 1.8 cm on image 70 versus 2.1 cm on the prior.  Left external iliac node measures 1.1 cm today versus 1.5 cm on the prior.  Reproductive: Moderate prostatomegaly.  Other: No significant free fluid.  Musculoskeletal: Aosteopenia. Chronic compression fractures involving the sacrum bilaterally. These are likely pathologic, with underlying infiltrative metastasis identified. Metastatic disease also involves the L5 vertebral body and is grossly similar. S-shaped thoracolumbar spine curvature.  IMPRESSION: CT CHEST IMPRESSION  1. Similar radiation fibrosis within the perihilar regions bilaterally. 2. Overall similar thoracic adenopathy. A low right jugular node is similar to minimally enlarged and indeterminate. 3. Probable scarring in both lower lobes. A somewhat more nodular component in the superior segment right lower lobe warrants followup attention. 4.  Atherosclerosis, including within the coronary arteries.  CT ABDOMEN AND PELVIS IMPRESSION  1. Similar to slight improvement in bulky retroperitoneal adenopathy. 2. Similar osseous metastasis involving L5 and the sacrum. Sacral insufficiency fracture as detailed previously. 3. No new sites  of disease identified. 4. Foley catheter within a partially decompressed urinary bladder. Wall thickening which could be secondary or indicate bladder outlet obstruction and/or cystitis.   Electronically Signed   By: Abigail Miyamoto M.D.   On: 01/19/2015 16:43   Ct Chest Wo Contrast  01/19/2015   CLINICAL DATA:  metastatic lung cancer. Cough. Chemotherapy and radiation therapy complete. Restaging. Bone metastasis.  EXAM: CT CHEST, ABDOMEN AND PELVIS WITHOUT CONTRAST  TECHNIQUE: Multidetector CT imaging of the chest, abdomen and pelvis was performed following the standard protocol without IV contrast.  COMPARISON:  10/16/2014.  Clinic note 10/20/2014.  FINDINGS: CT CHEST FINDINGS  Mediastinum/Nodes: Right low jugular/ supraclavicular node measures 11 mm on image 3 versus 10 mm on the prior. Mild left-sided gynecomastia. Tortuous thoracic aorta with atherosclerosis within. Mild cardiomegaly with multivessel coronary artery atherosclerosis. High right paratracheal node measures 8 mm on image 13 and is unchanged.  A low right paratracheal/ precarinal node measures 1.2 cm on image 25 versus 1.3 cm on the prior.  Hilar regions poorly evaluated without intravenous contrast.  Lungs/Pleura: No pleural fluid. Moderate centrilobular emphysema. Similar narrowing of right-sided endobronchial tree,  likely treatment related.  Right lower lobe nodularity, including at 5 mm on image 41. This is unchanged. Probable scarring in the right lower lobe on images 29 and 30. The more inferior and medial component on image 30 is somewhat nodular, measuring 6 mm today versus 4 mm on the prior.  Similar volume loss the left lung base. Similar configuration of paramediastinal fibrosis bilaterally.  Musculoskeletal: No acute osseous abnormality.  CT ABDOMEN AND PELVIS FINDINGS  Hepatobiliary: Scattered well-circumscribed low-density liver lesions which are favored to represent cysts. Normal gallbladder, without biliary ductal dilatation.  Periampullary duodenal diverticulum incidentally noted.  Pancreas: Normal, without mass or ductal dilatation.  Spleen: Normal  Adrenals/Urinary Tract: Normal adrenal glands. Fluid density left renal lesion is likely a cyst. No renal calculi or hydronephrosis. No hydroureter or ureteric calculi. Foley catheter within the urinary bladder, which is decompressed partially. Apparent wall thickening is nonspecific and similar to improved.  Stomach/Bowel: Normal stomach, without wall thickening. Normal colon and terminal ileum. Normal small bowel.  Vascular/Lymphatic: Non aneurysmal infrarenal aortic dilatation. 2.8 cm. Wall thrombus within.  Extensive retroperitoneal adenopathy. A retrocaval node measures 2.2 cm on image 69 versus 2.1 cm on the prior.  Index left periaortic node measures 1.2 cm on image 72 versus 1.4 cm on the prior.  Preaortic/pre caval node measures 1.8 cm on image 70 versus 2.1 cm on the prior.  Left external iliac node measures 1.1 cm today versus 1.5 cm on the prior.  Reproductive: Moderate prostatomegaly.  Other: No significant free fluid.  Musculoskeletal: Aosteopenia. Chronic compression fractures involving the sacrum bilaterally. These are likely pathologic, with underlying infiltrative metastasis identified. Metastatic disease also involves the L5 vertebral body and is grossly similar. S-shaped thoracolumbar spine curvature.  IMPRESSION: CT CHEST IMPRESSION  1. Similar radiation fibrosis within the perihilar regions bilaterally. 2. Overall similar thoracic adenopathy. A low right jugular node is similar to minimally enlarged and indeterminate. 3. Probable scarring in both lower lobes. A somewhat more nodular component in the superior segment right lower lobe warrants followup attention. 4.  Atherosclerosis, including within the coronary arteries.  CT ABDOMEN AND PELVIS IMPRESSION  1. Similar to slight improvement in bulky retroperitoneal adenopathy. 2. Similar osseous metastasis involving L5 and  the sacrum. Sacral insufficiency fracture as detailed previously. 3. No new sites of disease identified. 4. Foley catheter within a partially decompressed urinary bladder. Wall thickening which could be secondary or indicate bladder outlet obstruction and/or cystitis.   Electronically Signed   By: Abigail Miyamoto M.D.   On: 01/19/2015 16:43   ASSESSMENT AND PLAN: this is a very pleasant 80 years old white male with metastatic non-small cell lung cancer initially diagnosed as stage IIIB non-small cell lung cancer status post concurrent chemoradiation followed by consolidation chemotherapy. He had evidence for disease recurrence and the patient was started on systemic chemotherapy with carboplatin and Alimta discontinued secondary to disease progression. He is currently undergoing immunotherapy with single agent Nivolumab status post 8 cycles. This was discontinued after the patient had progressive disease in the lumbar spines and require surgical resection followed by palliative radiotherapy. The recent CT scan of the Chest, Abdomen and pelvis showed stable disease showed no evidence for disease progression. The patient is otherwise asymptomatic except for the low back pain and weakness. I discussed the scan results with the patient and his family. I recommended for him to continue on observation with repeat CT scan of the chest, abdomen and pelvis in 4 months for reevaluation of his disease.  For the muscle weakness and mobility, we will physical therapy to evaluate the needs for this patient. He was advised to call immediately if he has any concerning symptoms in the interval. The patient voices understanding of current disease status and treatment options and is in agreement with the current care plan.  All questions were answered. The patient knows to call the clinic with any problems, questions or concerns. We can certainly see the patient much sooner if necessary.  Disclaimer: This note was dictated  with voice recognition software. Similar sounding words can inadvertently be transcribed and may not be corrected upon review.

## 2015-01-28 NOTE — Telephone Encounter (Signed)
s.w.l pt wife and advise don DEC appt.Marland KitchenMarland KitchenMarland KitchenMarland Kitchenpt ok and aware

## 2015-01-28 NOTE — Telephone Encounter (Signed)
I elft a message for [facility] home health PT to return my call.

## 2015-01-28 NOTE — Telephone Encounter (Signed)
Notes faxed to PT.

## 2015-02-04 ENCOUNTER — Telehealth: Payer: Self-pay | Admitting: *Deleted

## 2015-02-04 DIAGNOSIS — C412 Malignant neoplasm of vertebral column: Secondary | ICD-10-CM

## 2015-02-04 NOTE — Telephone Encounter (Signed)
Gretchen from [facility] called with request for pt to have 4 wheeled walker, continue PT 1 x week for 1 week, 2x week for 1 week and 1x week for 1 week. Rx for walker will be faxed to [facility] home health [phone]

## 2015-04-28 ENCOUNTER — Telehealth: Payer: Self-pay | Admitting: Internal Medicine

## 2015-04-28 NOTE — Telephone Encounter (Signed)
returned call and r/s appts to Jan....pt ok and aware of new d.t

## 2015-05-06 ENCOUNTER — Telehealth (HOSPITAL_COMMUNITY): Payer: Self-pay

## 2015-05-07 ENCOUNTER — Other Ambulatory Visit: Payer: Medicare Other

## 2015-05-07 ENCOUNTER — Ambulatory Visit (HOSPITAL_COMMUNITY): Payer: Medicare Other

## 2015-05-14 ENCOUNTER — Ambulatory Visit: Payer: Medicare Other | Admitting: Internal Medicine

## 2015-06-11 ENCOUNTER — Ambulatory Visit (HOSPITAL_BASED_OUTPATIENT_CLINIC_OR_DEPARTMENT_OTHER): Payer: Medicare Other

## 2015-06-11 ENCOUNTER — Ambulatory Visit (HOSPITAL_COMMUNITY)
Admission: RE | Admit: 2015-06-11 | Discharge: 2015-06-11 | Disposition: A | Discharge status: Home / Self Care | Payer: Medicare Other | Source: Ambulatory Visit | Attending: Internal Medicine | Admitting: Internal Medicine

## 2015-06-11 ENCOUNTER — Encounter (HOSPITAL_COMMUNITY): Payer: Self-pay

## 2015-06-11 ENCOUNTER — Other Ambulatory Visit: Payer: Self-pay | Admitting: Medical Oncology

## 2015-06-11 DIAGNOSIS — Z923 Personal history of irradiation: Secondary | ICD-10-CM | POA: Diagnosis not present

## 2015-06-11 DIAGNOSIS — N181 Chronic kidney disease, stage 1: Secondary | ICD-10-CM

## 2015-06-11 DIAGNOSIS — N2889 Other specified disorders of kidney and ureter: Secondary | ICD-10-CM

## 2015-06-11 DIAGNOSIS — N62 Hypertrophy of breast: Secondary | ICD-10-CM | POA: Insufficient documentation

## 2015-06-11 DIAGNOSIS — N400 Enlarged prostate without lower urinary tract symptoms: Secondary | ICD-10-CM | POA: Diagnosis not present

## 2015-06-11 DIAGNOSIS — C3411 Malignant neoplasm of upper lobe, right bronchus or lung: Secondary | ICD-10-CM

## 2015-06-11 DIAGNOSIS — C7951 Secondary malignant neoplasm of bone: Secondary | ICD-10-CM

## 2015-06-11 DIAGNOSIS — R590 Localized enlarged lymph nodes: Secondary | ICD-10-CM | POA: Insufficient documentation

## 2015-06-11 DIAGNOSIS — C3432 Malignant neoplasm of lower lobe, left bronchus or lung: Secondary | ICD-10-CM | POA: Diagnosis not present

## 2015-06-11 DIAGNOSIS — Z9221 Personal history of antineoplastic chemotherapy: Secondary | ICD-10-CM | POA: Insufficient documentation

## 2015-06-11 DIAGNOSIS — C3490 Malignant neoplasm of unspecified part of unspecified bronchus or lung: Secondary | ICD-10-CM

## 2015-06-11 LAB — CBC WITH DIFFERENTIAL/PLATELET
BASO%: 0.6 % (ref 0.0–2.0)
BASOS ABS: 0 10*3/uL (ref 0.0–0.1)
EOS ABS: 0.2 10*3/uL (ref 0.0–0.5)
EOS%: 3.3 % (ref 0.0–7.0)
HEMATOCRIT: 37.4 % — AB (ref 38.4–49.9)
HEMOGLOBIN: 12.4 g/dL — AB (ref 13.0–17.1)
LYMPH#: 0.6 10*3/uL — AB (ref 0.9–3.3)
LYMPH%: 9.8 % — ABNORMAL LOW (ref 14.0–49.0)
MCH: 31.5 pg (ref 27.2–33.4)
MCHC: 33 g/dL (ref 32.0–36.0)
MCV: 95.3 fL (ref 79.3–98.0)
MONO#: 0.4 10*3/uL (ref 0.1–0.9)
MONO%: 7.2 % (ref 0.0–14.0)
NEUT%: 79.1 % — ABNORMAL HIGH (ref 39.0–75.0)
NEUTROS ABS: 4.7 10*3/uL (ref 1.5–6.5)
PLATELETS: 183 10*3/uL (ref 140–400)
RBC: 3.92 10*6/uL — ABNORMAL LOW (ref 4.20–5.82)
RDW: 14 % (ref 11.0–14.6)
WBC: 5.9 10*3/uL (ref 4.0–10.3)

## 2015-06-11 LAB — COMPREHENSIVE METABOLIC PANEL
ALBUMIN: 3.7 g/dL (ref 3.5–5.0)
ALT: 9 U/L (ref 0–55)
ANION GAP: 9 meq/L (ref 3–11)
AST: 14 U/L (ref 5–34)
Alkaline Phosphatase: 81 U/L (ref 40–150)
BUN: 20.3 mg/dL (ref 7.0–26.0)
CALCIUM: 9.3 mg/dL (ref 8.4–10.4)
CHLORIDE: 106 meq/L (ref 98–109)
CO2: 24 meq/L (ref 22–29)
Creatinine: 1.3 mg/dL (ref 0.7–1.3)
EGFR: 52 mL/min/{1.73_m2} — AB (ref >90)
Glucose: 96 mg/dl (ref 70–140)
POTASSIUM: 4.2 meq/L (ref 3.5–5.1)
Sodium: 139 mEq/L (ref 136–145)
Total Bilirubin: 0.58 mg/dL (ref 0.20–1.20)
Total Protein: 7 g/dL (ref 6.4–8.3)

## 2015-06-11 MED ORDER — IOHEXOL 300 MG/ML  SOLN
50.0000 mL | Freq: Once | INTRAMUSCULAR | Status: DC | PRN
  Administered 2015-06-11: 50 mL via ORAL
  Filled 2015-06-11: qty 50

## 2015-06-18 ENCOUNTER — Telehealth: Payer: Self-pay | Admitting: Internal Medicine

## 2015-06-18 ENCOUNTER — Encounter: Payer: Self-pay | Admitting: Internal Medicine

## 2015-06-18 ENCOUNTER — Ambulatory Visit (HOSPITAL_BASED_OUTPATIENT_CLINIC_OR_DEPARTMENT_OTHER): Payer: Medicare Other | Admitting: Internal Medicine

## 2015-06-18 VITALS — BP 123/77 | HR 89 | Temp 97.9°F | Resp 18 | Ht 66.5 in | Wt 164.8 lb

## 2015-06-18 DIAGNOSIS — C7951 Secondary malignant neoplasm of bone: Secondary | ICD-10-CM

## 2015-06-18 DIAGNOSIS — C3490 Malignant neoplasm of unspecified part of unspecified bronchus or lung: Secondary | ICD-10-CM | POA: Diagnosis not present

## 2015-06-18 DIAGNOSIS — C3411 Malignant neoplasm of upper lobe, right bronchus or lung: Secondary | ICD-10-CM

## 2015-06-18 DIAGNOSIS — Z5112 Encounter for antineoplastic immunotherapy: Secondary | ICD-10-CM

## 2015-06-18 NOTE — Progress Notes (Signed)
[facility] [phone]   [phone]) [phone]  OFFICE PROGRESS NOTE  Logan Mask, MD [address] [place] [address]  DIAGNOSIS: Stage IIIB (T2a., N3, M0) non-small cell lung cancer consistent with invasive adenocarcinoma with negative ALK gene translocation and negative EGFR mutation diagnosed in October of 2013   PRIOR THERAPY:  1) Concurrent chemoradiation with weekly carboplatin for AUC of 2 and paclitaxel 45 mg/M2, last dose was given 06/11/2012 with partial response.  2) Consolidation chemotherapy with carboplatin for AUC of 5 and Alimta 500 mg/M2 every 3 weeks. Status post 3 cycles last dose was given 09/12/2012 with stable disease.  3) Systemic chemotherapy again with carboplatin for AUC of 5 and Alimta 500 mg/M2 every 3 weeks. First cycle expected on 11/13/2013. Status post 3 cycles, discontinued secondary to disease progression. Carboplatin was discontinued starting from cycle #2 secondary to hypersensitivity reaction. 4) Immunotherapy with single agent Nivolumab 3 MG/TG every 2 weeks status post 8 cycles.  5) Decompressive lumbar laminectomies from L4 to S1 for removal of metastatic tumor bilateral under the care of Dr. Wynetta Emery on 05/27/2014. 6) status post palliative radiotherapy to the L4-S1 joint region of the lower spine for a total dose of 75 GYN he completed on 07/10/2014.  CURRENT THERAPY: Treatment with immunotherapy again with single agent Nivolumab 240 MG IV every 2 weeks. First dose 06/25/2015.  CHEMOTHERAPY INTENT: Palliative  CURRENT # OF CHEMOTHERAPY CYCLES: 0  CURRENT ANTIEMETICS: N/A  CURRENT SMOKING STATUS: Former Smoker  ORAL CHEMOTHERAPY AND CONSENT: None  CURRENT BISPHOSPHONATES USE: None  PAIN MANAGEMENT: 0/10  NARCOTICS INDUCED CONSTIPATION: None  LIVING WILL AND CODE STATUS: no code Blue  INTERVAL HISTORY: Logan Russell 81 y.o. male returns to the clinic today for follow-up visit accompanied  by his wife. The patient is feeling fine today with no specific complaints except for the persistent low back pain and mild fatigue. He denied having any nausea or vomiting, no fever or chills, no diarrhea or skin rash. The patient denied having any significant weight loss or night sweats. He denied having any significant chest pain, shortness of breath, cough or hemoptysis. He had repeat CT scan of the chest, abdomen and pelvis performed recently and he is here for evaluation and discussion of his scan results.  MEDICAL HISTORY: Past Medical History  Diagnosis Date  . Hypertension   . History of radiation therapy 05/02/12-06/19/12    left lung mediastinal lymph nodes 66Gy/51fx  . Cauda equina syndrome (HCC)   . Lung cancer (HCC) 04/12/2012  . Bone cancer (HCC)     non small cell lung ca with mets to spine    ALLERGIES:  has No Known Allergies.  MEDICATIONS:  Current Outpatient Prescriptions  Medication Sig Dispense Refill  . furosemide (LASIX) 40 MG tablet Take 40 mg by mouth daily as needed.    Marland Kitchen guaiFENesin (MUCINEX) 600 MG 12 hr tablet Take 2 tablets (1,200 mg total) by mouth 2 (two) times daily as needed for cough or to loosen phlegm.    . Oxycodone HCl 10 MG TABS Take 10 mg by mouth every 4 (four) hours as needed. for pain  0  . polyethylene glycol (MIRALAX / GLYCOLAX) packet Take 17 g by mouth daily as needed for moderate constipation.    Marland Kitchen acetaminophen (TYLENOL) 325 MG tablet Take 2 tablets (650 mg total) by mouth every 4 (four) hours as needed for mild pain or fever (temp > 100.5). (Patient  not taking: Reported on 06/18/2015)    . bisacodyl (DULCOLAX) 10 MG suppository Place 1 suppository (10 mg total) rectally daily as needed for severe constipation. (Patient not taking: Reported on 01/28/2015)    . promethazine (PHENERGAN) 25 MG tablet Take 25 mg by mouth every 6 (six) hours as needed for nausea or vomiting. Reported on 06/18/2015    . senna (SENOKOT) 8.6 MG TABS tablet Take 1 tablet  (8.6 mg total) by mouth daily as needed for mild constipation. (Patient not taking: Reported on 01/28/2015)     No current facility-administered medications for this visit.    REVIEW OF SYSTEMS:  Constitutional: positive for fatigue Eyes: negative Ears, nose, mouth, throat, and face: negative Respiratory: negative Cardiovascular: negative Gastrointestinal: negative Genitourinary:negative Integument/breast: negative Hematologic/lymphatic: negative Musculoskeletal:positive for back pain Neurological: negative Behavioral/Psych: negative Endocrine: negative Allergic/Immunologic: negative   PHYSICAL EXAMINATION: General appearance: alert, cooperative, fatigued and no distress Head: Normocephalic, without obvious abnormality, atraumatic Neck: no adenopathy Lymph nodes: Cervical, supraclavicular, and axillary nodes normal. Resp: clear to auscultation bilaterally Back: symmetric, no curvature. ROM normal. No CVA tenderness. Cardio: regular rate and rhythm, S1, S2 normal, no murmur, click, rub or gallop GI: soft, non-tender; bowel sounds normal; no masses,  no organomegaly Extremities: extremities normal, atraumatic, no cyanosis or edema Neurologic: Alert and oriented X 3, normal strength and tone. Normal symmetric reflexes. Normal coordination and gait  ECOG PERFORMANCE STATUS: 2 - Symptomatic, <50% confined to bed  Blood pressure 123/77, pulse 89, temperature 97.9 F (36.6 C), temperature source Oral, resp. rate 18, height 5' 6.5" (1.689 m), weight 164 lb 12.8 oz (74.753 kg), SpO2 100 %.  LABORATORY DATA: Lab Results  Component Value Date   WBC 5.9 06/11/2015   HGB 12.4* 06/11/2015   HCT 37.4* 06/11/2015   MCV 95.3 06/11/2015   PLT 183 06/11/2015      Chemistry      Component Value Date/Time   NA 139 06/11/2015 1029   NA 134* 06/03/2014 0356   K 4.2 06/11/2015 1029   K 3.6 06/03/2014 0356   CL 101 06/03/2014 0356   CL 106 10/08/2012 1028   CO2 24 06/11/2015 1029   CO2  27 06/03/2014 0356   BUN 20.3 06/11/2015 1029   BUN 29* 06/03/2014 0356   CREATININE 1.3 06/11/2015 1029   CREATININE 1.66* 06/03/2014 0356      Component Value Date/Time   CALCIUM 9.3 06/11/2015 1029   CALCIUM 8.4 06/03/2014 0356   ALKPHOS 81 06/11/2015 1029   ALKPHOS 66 05/28/2014 0805   AST 14 06/11/2015 1029   AST 17 05/28/2014 0805   ALT 9 06/11/2015 1029   ALT 15 05/28/2014 0805   BILITOT 0.58 06/11/2015 1029   BILITOT 0.5 05/28/2014 0805       RADIOGRAPHIC STUDIES: Ct Abdomen Pelvis Wo Contrast  06/11/2015  CLINICAL DATA:  lung cancer diagnosed in 2013 with bone metastasis to spine. Chemotherapy and radiation therapy completed in 2014. Constipation. Renal insufficiency. EXAM: CT CHEST, ABDOMEN AND PELVIS WITHOUT CONTRAST TECHNIQUE: Multidetector CT imaging of the chest, abdomen and pelvis was performed following the standard protocol without IV contrast. COMPARISON:  01/19/2015 FINDINGS: CT CHEST Mediastinum/Nodes: Low right jugular nodes are decreased in size, including at maximally 6 mm today versus 11 mm on the prior. Mild left-sided gynecomastia. Aortic and branch vessel atherosclerosis. Normal heart size, without pericardial effusion. Multivessel coronary artery atherosclerosis. 12 mm right paratracheal node is unchanged. Hilar regions not well evaluated secondary to the radiation induced edema and  lack of IV contrast the. A low left mediastinal node versus less likely pleural-based left lower lobe pulmonary nodule is significantly enlarged. 2.2 x 1.8 cm on image 41/series 2 today. 11 x 13 mm on the prior. Enlargement of a periesophageal node on image 27/series 2 which remains small but is suspicious Lungs/Pleura: No pleural fluid.  Mild centrilobular emphysema. Lateral right lower lobe pulmonary nodule measures 6 mm on image 40/series 4. Similar to minimally increased from 5 mm on the prior exam. A right lower lobe pulmonary nodule measures 6 mm on image 47/series 4. Either new or  significantly enlarged from the prior. Radiation changes in the paramediastinal lungs bilaterally. Ground-glass opacity in the superior segment right lower lobe is slightly increased and favored to be radiation induced. More nodular central component measures maximally 8 mm on image 30/series 4. Compare 6 mm on the prior. Left lower lobe pleural-based density measures 11 mm on image 47/series 4. Smaller and more linear at 6 mm on the prior. Probable scarring in the left lung base laterally, progressive. Musculoskeletal: Sclerosis in the left sixth rib is new or increased including on image 27/series 2. CT ABDOMEN AND PELVIS Hepatobiliary: Scattered well-circumscribed low-density liver lesions which are likely cysts. Normal gallbladder, without biliary ductal dilatation. Pancreas: Normal, without mass or ductal dilatation. Periampullary duodenal diverticulum incidentally noted. Spleen: Normal in size, without focal abnormality. Adrenals/Urinary Tract: Low-density right adrenal nodule is likely an adenoma. Normal left adrenal gland. An interpolar left renal lesion is likely a cyst. No renal calculi or hydronephrosis. Foley catheter within the urinary bladder. Bladder remains mildly thick walled with surrounding edema. Stomach/Bowel: Proximal gastric underdistention. Normal colon and terminal ileum. Normal small bowel. Vascular/Lymphatic: Aortic and branch vessel atherosclerosis. Infrarenal calcified wall thrombus versus focal dissection is similar, including on image 79/series 2. Re- demonstration of bulky retroperitoneal adenopathy. Left parariaortic node measures 1.2 cm on image 72/series 2 and is similar. An aortocaval node measures 2.8 cm today versus 1.8 cm on the prior. Index retrocaval node measures 2.4 cm on image 69/series 2 versus 2.2 cm on the prior. Enlarging nodes in the root of the small bowel mesentery are suspicious, including a 12 mm node on image 65/series 2. Left external iliac node is unchanged at  1.0 cm. Lateral right inguinal node measures 1.3 cm image 98/series 2 today versus 1.0 cm on the prior. Reproductive: Moderate prostatomegaly. Other: No significant free fluid. Musculoskeletal: Sclerotic osseous metastasis. Insufficiency fractures involving the sacrum bilaterally, chronic. IMPRESSION: CT CHEST IMPRESSION 1. Radiation changes within the chest. New and increased pulmonary nodularity, suspicious for progressive metastatic disease. 2. Enlargement of thoracic nodes, including a low left mediastinal node. 3. Sclerosis within the left sixth rib, suspicious for progressive osseous metastasis within the chest. 4. Left gynecomastia. CT ABDOMEN AND PELVIS IMPRESSION 1. Progression of bulky abdominal adenopathy with developing pelvic adenopathy. 2. Similar extensive osseous metastasis with insufficiency fractures involving the sacrum. 3. Prostatomegaly with similar bladder findings which could relate to bladder outlet obstruction and/or cystitis. Electronically Signed   By: Jeronimo Greaves M.D.   On: 06/11/2015 14:09   Ct Chest Wo Contrast  06/11/2015  CLINICAL DATA:  lung cancer diagnosed in 2013 with bone metastasis to spine. Chemotherapy and radiation therapy completed in 2014. Constipation. Renal insufficiency. EXAM: CT CHEST, ABDOMEN AND PELVIS WITHOUT CONTRAST TECHNIQUE: Multidetector CT imaging of the chest, abdomen and pelvis was performed following the standard protocol without IV contrast. COMPARISON:  01/19/2015 FINDINGS: CT CHEST Mediastinum/Nodes: Low right jugular nodes are  decreased in size, including at maximally 6 mm today versus 11 mm on the prior. Mild left-sided gynecomastia. Aortic and branch vessel atherosclerosis. Normal heart size, without pericardial effusion. Multivessel coronary artery atherosclerosis. 12 mm right paratracheal node is unchanged. Hilar regions not well evaluated secondary to the radiation induced edema and lack of IV contrast the. A low left mediastinal node versus  less likely pleural-based left lower lobe pulmonary nodule is significantly enlarged. 2.2 x 1.8 cm on image 41/series 2 today. 11 x 13 mm on the prior. Enlargement of a periesophageal node on image 27/series 2 which remains small but is suspicious Lungs/Pleura: No pleural fluid.  Mild centrilobular emphysema. Lateral right lower lobe pulmonary nodule measures 6 mm on image 40/series 4. Similar to minimally increased from 5 mm on the prior exam. A right lower lobe pulmonary nodule measures 6 mm on image 47/series 4. Either new or significantly enlarged from the prior. Radiation changes in the paramediastinal lungs bilaterally. Ground-glass opacity in the superior segment right lower lobe is slightly increased and favored to be radiation induced. More nodular central component measures maximally 8 mm on image 30/series 4. Compare 6 mm on the prior. Left lower lobe pleural-based density measures 11 mm on image 47/series 4. Smaller and more linear at 6 mm on the prior. Probable scarring in the left lung base laterally, progressive. Musculoskeletal: Sclerosis in the left sixth rib is new or increased including on image 27/series 2. CT ABDOMEN AND PELVIS Hepatobiliary: Scattered well-circumscribed low-density liver lesions which are likely cysts. Normal gallbladder, without biliary ductal dilatation. Pancreas: Normal, without mass or ductal dilatation. Periampullary duodenal diverticulum incidentally noted. Spleen: Normal in size, without focal abnormality. Adrenals/Urinary Tract: Low-density right adrenal nodule is likely an adenoma. Normal left adrenal gland. An interpolar left renal lesion is likely a cyst. No renal calculi or hydronephrosis. Foley catheter within the urinary bladder. Bladder remains mildly thick walled with surrounding edema. Stomach/Bowel: Proximal gastric underdistention. Normal colon and terminal ileum. Normal small bowel. Vascular/Lymphatic: Aortic and branch vessel atherosclerosis. Infrarenal  calcified wall thrombus versus focal dissection is similar, including on image 79/series 2. Re- demonstration of bulky retroperitoneal adenopathy. Left parariaortic node measures 1.2 cm on image 72/series 2 and is similar. An aortocaval node measures 2.8 cm today versus 1.8 cm on the prior. Index retrocaval node measures 2.4 cm on image 69/series 2 versus 2.2 cm on the prior. Enlarging nodes in the root of the small bowel mesentery are suspicious, including a 12 mm node on image 65/series 2. Left external iliac node is unchanged at 1.0 cm. Lateral right inguinal node measures 1.3 cm image 98/series 2 today versus 1.0 cm on the prior. Reproductive: Moderate prostatomegaly. Other: No significant free fluid. Musculoskeletal: Sclerotic osseous metastasis. Insufficiency fractures involving the sacrum bilaterally, chronic. IMPRESSION: CT CHEST IMPRESSION 1. Radiation changes within the chest. New and increased pulmonary nodularity, suspicious for progressive metastatic disease. 2. Enlargement of thoracic nodes, including a low left mediastinal node. 3. Sclerosis within the left sixth rib, suspicious for progressive osseous metastasis within the chest. 4. Left gynecomastia. CT ABDOMEN AND PELVIS IMPRESSION 1. Progression of bulky abdominal adenopathy with developing pelvic adenopathy. 2. Similar extensive osseous metastasis with insufficiency fractures involving the sacrum. 3. Prostatomegaly with similar bladder findings which could relate to bladder outlet obstruction and/or cystitis. Electronically Signed   By: Jeronimo Greaves M.D.   On: 06/11/2015 14:09   ASSESSMENT AND PLAN: this is a very pleasant 81 years old white male with metastatic non-small cell  lung cancer initially diagnosed as stage IIIB non-small cell lung cancer status post concurrent chemoradiation followed by consolidation chemotherapy.  He had evidence for disease recurrence and the patient was started on systemic chemotherapy with carboplatin and Alimta  discontinued secondary to disease progression. He is currently undergoing immunotherapy with single agent Nivolumab status post 8 cycles. This was discontinued after the patient had progressive disease in the lumbar spines and require surgical resection followed by palliative radiotherapy. The recent CT scan of the chest, abdomen and pelvis showed new and increased pulmonary nodularity, suspicious for progressive metastatic disease. There was also enlargement of thoracic nodes, including a low left mediastinal node and progression of bulky abdominal lymphadenopathy. I discussed the scan results with the patient and his wife. I gave him the option of palliative care and hospice referral versus consideration of treatment with immunotherapy again with Nivolumab 240 MG IV every 2 weeks. The patient is interested in resuming his treatment with immunotherapy and he is expected to start the first cycle of this treatment on 06/25/2015. I reminded the patient with the adverse effect of this treatment including but not limited to immune mediated pneumonitis, colitis, liver, renal, thyroid or other endocrine dysfunction. He would come back for follow-up visit in 3 weeks for reevaluation with the start of cycle #2 of this treatment. He was advised to call immediately if he has any concerning symptoms in the interval. The patient voices understanding of current disease status and treatment options and is in agreement with the current care plan. All questions were answered. The patient knows to call the clinic with any problems, questions or concerns. We can certainly see the patient much sooner if necessary.  Disclaimer: This note was dictated with voice recognition software. Similar sounding words can inadvertently be transcribed and may not be corrected upon review.

## 2015-06-18 NOTE — Telephone Encounter (Signed)
Gv pt appts for Jan + Feb.

## 2015-06-19 ENCOUNTER — Encounter: Payer: Self-pay | Admitting: Internal Medicine

## 2015-06-19 DIAGNOSIS — Z5112 Encounter for antineoplastic immunotherapy: Secondary | ICD-10-CM

## 2015-06-19 HISTORY — DX: Encounter for antineoplastic immunotherapy: Z51.12

## 2015-06-25 ENCOUNTER — Other Ambulatory Visit: Payer: Self-pay | Admitting: *Deleted

## 2015-06-25 ENCOUNTER — Ambulatory Visit (HOSPITAL_BASED_OUTPATIENT_CLINIC_OR_DEPARTMENT_OTHER): Payer: Medicare Other

## 2015-06-25 ENCOUNTER — Other Ambulatory Visit: Payer: Medicare Other

## 2015-06-25 VITALS — BP 98/65 | HR 105 | Temp 98.7°F | Resp 24

## 2015-06-25 DIAGNOSIS — C3411 Malignant neoplasm of upper lobe, right bronchus or lung: Secondary | ICD-10-CM

## 2015-06-25 DIAGNOSIS — Z79899 Other long term (current) drug therapy: Secondary | ICD-10-CM

## 2015-06-25 DIAGNOSIS — Z5112 Encounter for antineoplastic immunotherapy: Secondary | ICD-10-CM

## 2015-06-25 DIAGNOSIS — C7951 Secondary malignant neoplasm of bone: Secondary | ICD-10-CM

## 2015-06-25 DIAGNOSIS — C3432 Malignant neoplasm of lower lobe, left bronchus or lung: Secondary | ICD-10-CM

## 2015-06-25 LAB — COMPREHENSIVE METABOLIC PANEL
ALT: <9 U/L (ref 0–55)
ANION GAP: 11 meq/L (ref 3–11)
AST: 8 U/L (ref 5–34)
Albumin: 3.1 g/dL — ABNORMAL LOW (ref 3.5–5.0)
Alkaline Phosphatase: 65 U/L (ref 40–150)
BILIRUBIN TOTAL: 1.06 mg/dL (ref 0.20–1.20)
BUN: 23.7 mg/dL (ref 7.0–26.0)
CALCIUM: 9.3 mg/dL (ref 8.4–10.4)
CO2: 20 meq/L — AB (ref 22–29)
Chloride: 105 mEq/L (ref 98–109)
Creatinine: 1.3 mg/dL (ref 0.7–1.3)
EGFR: 52 mL/min/{1.73_m2} — AB (ref >90)
Glucose: 118 mg/dl (ref 70–140)
Potassium: 3.9 mEq/L (ref 3.5–5.1)
Sodium: 136 mEq/L (ref 136–145)
TOTAL PROTEIN: 7 g/dL (ref 6.4–8.3)

## 2015-06-25 LAB — CBC WITH DIFFERENTIAL/PLATELET
BASO%: 0.1 % (ref 0.0–2.0)
Basophils Absolute: 0 10*3/uL (ref 0.0–0.1)
EOS ABS: 0.1 10*3/uL (ref 0.0–0.5)
EOS%: 0.3 % (ref 0.0–7.0)
HEMATOCRIT: 34.6 % — AB (ref 38.4–49.9)
HGB: 11.7 g/dL — ABNORMAL LOW (ref 13.0–17.1)
LYMPH%: 2.9 % — AB (ref 14.0–49.0)
MCH: 31.8 pg (ref 27.2–33.4)
MCHC: 33.8 g/dL (ref 32.0–36.0)
MCV: 94 fL (ref 79.3–98.0)
MONO#: 1.3 10*3/uL — AB (ref 0.1–0.9)
MONO%: 7.2 % (ref 0.0–14.0)
NEUT%: 89.5 % — AB (ref 39.0–75.0)
NEUTROS ABS: 15.9 10*3/uL — AB (ref 1.5–6.5)
PLATELETS: 171 10*3/uL (ref 140–400)
RBC: 3.68 10*6/uL — AB (ref 4.20–5.82)
RDW: 13.4 % (ref 11.0–14.6)
WBC: 17.8 10*3/uL — ABNORMAL HIGH (ref 4.0–10.3)
lymph#: 0.5 10*3/uL — ABNORMAL LOW (ref 0.9–3.3)

## 2015-06-25 LAB — TSH: TSH: 2.229 m(IU)/L (ref 0.320–4.118)

## 2015-06-25 MED ORDER — SODIUM CHLORIDE 0.9 % IV SOLN
Freq: Once | INTRAVENOUS | Status: AC
  Administered 2015-06-25: 10:00:00 via INTRAVENOUS

## 2015-06-25 MED ORDER — SODIUM CHLORIDE 0.9 % IV SOLN
240.0000 mg | Freq: Once | INTRAVENOUS | Status: AC
  Administered 2015-06-25: 240 mg via INTRAVENOUS
  Filled 2015-06-25: qty 8

## 2015-06-25 NOTE — Patient Instructions (Signed)
[facility] Discharge Instructions for Patients Receiving Chemotherapy  Today you received the following chemotherapy agents NIVOLUMAB (OPDIVO)  To help prevent nausea and vomiting after your treatment, we encourage you to take your nausea medication IF NEEDED   If you develop nausea and vomiting that is not controlled by your nausea medication, call the clinic.   BELOW ARE SYMPTOMS THAT SHOULD BE REPORTED IMMEDIATELY:  *FEVER GREATER THAN 100.5 F  *CHILLS WITH OR WITHOUT FEVER  NAUSEA AND VOMITING THAT IS NOT CONTROLLED WITH YOUR NAUSEA MEDICATION  *UNUSUAL SHORTNESS OF BREATH  *UNUSUAL BRUISING OR BLEEDING  TENDERNESS IN MOUTH AND THROAT WITH OR WITHOUT PRESENCE OF ULCERS  *URINARY PROBLEMS  *BOWEL PROBLEMS  UNUSUAL RASH Items with * indicate a potential emergency and should be followed up as soon as possible.  Feel free to call the clinic you have any questions or concerns. The clinic phone number is [phone].  Please show the [facility] at check-in to the Emergency Department and triage nurse.  Nivolumab injection What is this medicine? NIVOLUMAB (nye VOL ue mab) is a monoclonal antibody. It is used to treat melanoma, lung cancer, kidney cancer, and Hodgkin lymphoma. This medicine may be used for other purposes; ask your health care provider or pharmacist if you have questions. What should I tell my health care provider before I take this medicine? They need to know if you have any of these conditions: -diabetes -immune system problems -kidney disease -liver disease -lung disease -organ transplant -stomach or intestine problems -thyroid disease -an unusual or allergic reaction to nivolumab, other medicines, foods, dyes, or preservatives -pregnant or trying to get pregnant -breast-feeding How should I use this medicine? This medicine is for infusion into a vein. It is given by a health care professional in a hospital or clinic  setting. A special MedGuide will be given to you before each treatment. Be sure to read this information carefully each time. Talk to your pediatrician regarding the use of this medicine in children. Special care may be needed. Overdosage: If you think you have taken too much of this medicine contact a poison control center or emergency room at once. NOTE: This medicine is only for you. Do not share this medicine with others. What if I miss a dose? It is important not to miss your dose. Call your doctor or health care professional if you are unable to keep an appointment. What may interact with this medicine? Interactions have not been studied. Give your health care provider a list of all the medicines, herbs, non-prescription drugs, or dietary supplements you use. Also tell them if you smoke, drink alcohol, or use illegal drugs. Some items may interact with your medicine. This list may not describe all possible interactions. Give your health care provider a list of all the medicines, herbs, non-prescription drugs, or dietary supplements you use. Also tell them if you smoke, drink alcohol, or use illegal drugs. Some items may interact with your medicine. What should I watch for while using this medicine? This drug may make you feel generally unwell. Continue your course of treatment even though you feel ill unless your doctor tells you to stop. You may need blood work done while you are taking this medicine. Do not become pregnant while taking this medicine or for 5 months after stopping it. Women should inform their doctor if they wish to become pregnant or think they might be pregnant. There is a potential for serious side effects to an  unborn child. Talk to your health care professional or pharmacist for more information. Do not breast-feed an infant while taking this medicine. What side effects may I notice from receiving this medicine? Side effects that you should report to your doctor or health  care professional as soon as possible: -allergic reactions like skin rash, itching or hives, swelling of the face, lips, or tongue -black, tarry stools -blood in the urine -bloody or watery diarrhea -changes in vision -change in sex drive -changes in emotions or moods -chest pain -confusion -cough -decreased appetite -diarrhea -facial flushing -feeling faint or lightheaded -fever, chills -hair loss -hallucination, loss of contact with reality -headache -irritable -joint pain -loss of memory -muscle pain -muscle weakness -seizures -shortness of breath -signs and symptoms of high blood sugar such as dizziness; dry mouth; dry skin; fruity breath; nausea; stomach pain; increased hunger or thirst; increased urination -signs and symptoms of kidney injury like trouble passing urine or change in the amount of urine -signs and symptoms of liver injury like dark yellow or brown urine; general ill feeling or flu-like symptoms; light-colored stools; loss of appetite; nausea; right upper belly pain; unusually weak or tired; yellowing of the eyes or skin -stiff neck -swelling of the ankles, feet, hands -weight gain Side effects that usually do not require medical attention (report to your doctor or health care professional if they continue or are bothersome): -bone pain -constipation -tiredness -vomiting This list may not describe all possible side effects. Call your doctor for medical advice about side effects. You may report side effects to FDA at 1-800-FDA-1088. Where should I keep my medicine? This drug is given in a hospital or clinic and will not be stored at home. NOTE: This sheet is a summary. It may not cover all possible information. If you have questions about this medicine, talk to your doctor, pharmacist, or health care provider.    2016, [employer]vier/[employer] Standard. (2014-10-15 10:03:42)

## 2015-06-29 ENCOUNTER — Telehealth: Payer: Self-pay | Admitting: *Deleted

## 2015-06-29 NOTE — Telephone Encounter (Signed)
Did you offer smc or go to ED?

## 2015-06-29 NOTE — Telephone Encounter (Addendum)
Spouse Opal Sidles called.  "Brenan is sick.  He's not eating or getting out of bed.  He didn't eat all day Wednesday before Nivolumab last Thursday but he's been a different person ever since.   He's supposed to go to urology office tomorrow for catheter change by nurse but I can't get him in the car.  He's vomited three times over the weekend. Bowels are slow due to history of spinal surgery but they moved a little this morning.  Has same back pain he's had for three years.  Checked temp with this call.  Temp = 99.5.  He needs a nurse in the home and a wheelchair.  I asked for Burnetta Sabin because she ordered the walker he currently uses."  Return number [phone].  Aware Adrena no longer with this office.

## 2015-06-29 NOTE — Telephone Encounter (Signed)
Called home at this time check on patient status.  This morning's call wife expressed plan to call urologist office.  No answer at this time.  Message left with instructions to go to ED if he is "sick".

## 2015-06-30 ENCOUNTER — Telehealth: Payer: Self-pay | Admitting: Medical Oncology

## 2015-06-30 NOTE — Telephone Encounter (Signed)
Called spouse to check on patient and ensure receipt of message last evening.   "He was able to get OOB to the bathroom.  He threw up a lot of phlegm this morning he coughed so hard.  There's no food on his stomach to throw up any food.  The problem is I can't get him out.  I can't get him out to your office.  My son has to work and I'm scared Logan Russell will fall.  I know I can call 911 and almost called Friday but I'm trying to avoid that and my son was able to get here.  I've called Dr. Welton Flakes and I think he can get some help."  Again advised to call 911 if he needs medical attention and had low grade temp yesterday of 99.5.  "Thank you for calling back, that's more than I've had before."

## 2015-06-30 NOTE — Telephone Encounter (Signed)
I left message for wife to call.

## 2015-07-07 ENCOUNTER — Telehealth: Payer: Self-pay | Admitting: Internal Medicine

## 2015-07-07 NOTE — Telephone Encounter (Signed)
Faxed pt last office note to [facility] [phone]

## 2015-07-09 ENCOUNTER — Encounter: Payer: Self-pay | Admitting: Internal Medicine

## 2015-07-09 ENCOUNTER — Other Ambulatory Visit (HOSPITAL_BASED_OUTPATIENT_CLINIC_OR_DEPARTMENT_OTHER): Payer: Medicare Other

## 2015-07-09 ENCOUNTER — Telehealth: Payer: Self-pay | Admitting: Internal Medicine

## 2015-07-09 ENCOUNTER — Ambulatory Visit (HOSPITAL_BASED_OUTPATIENT_CLINIC_OR_DEPARTMENT_OTHER): Payer: Medicare Other | Admitting: Internal Medicine

## 2015-07-09 ENCOUNTER — Ambulatory Visit: Payer: Medicare Other

## 2015-07-09 VITALS — BP 122/59 | HR 113 | Temp 98.2°F | Resp 18 | Ht 66.5 in | Wt 154.9 lb

## 2015-07-09 DIAGNOSIS — C7951 Secondary malignant neoplasm of bone: Secondary | ICD-10-CM | POA: Diagnosis not present

## 2015-07-09 DIAGNOSIS — C3411 Malignant neoplasm of upper lobe, right bronchus or lung: Secondary | ICD-10-CM

## 2015-07-09 DIAGNOSIS — C3490 Malignant neoplasm of unspecified part of unspecified bronchus or lung: Secondary | ICD-10-CM

## 2015-07-09 DIAGNOSIS — Z79899 Other long term (current) drug therapy: Secondary | ICD-10-CM | POA: Diagnosis not present

## 2015-07-09 DIAGNOSIS — Z5112 Encounter for antineoplastic immunotherapy: Secondary | ICD-10-CM

## 2015-07-09 LAB — COMPREHENSIVE METABOLIC PANEL
ALT: 26 U/L (ref 0–55)
ANION GAP: 10 meq/L (ref 3–11)
AST: 17 U/L (ref 5–34)
Albumin: 2.3 g/dL — ABNORMAL LOW (ref 3.5–5.0)
Alkaline Phosphatase: 77 U/L (ref 40–150)
BUN: 20 mg/dL (ref 7.0–26.0)
CALCIUM: 9 mg/dL (ref 8.4–10.4)
CHLORIDE: 101 meq/L (ref 98–109)
CO2: 24 meq/L (ref 22–29)
CREATININE: 1 mg/dL (ref 0.7–1.3)
EGFR: 72 mL/min/{1.73_m2} — AB (ref >90)
Glucose: 109 mg/dl (ref 70–140)
POTASSIUM: 4.5 meq/L (ref 3.5–5.1)
Sodium: 135 mEq/L — ABNORMAL LOW (ref 136–145)
Total Bilirubin: 0.54 mg/dL (ref 0.20–1.20)
Total Protein: 7 g/dL (ref 6.4–8.3)

## 2015-07-09 LAB — CBC WITH DIFFERENTIAL/PLATELET
BASO%: 0.4 % (ref 0.0–2.0)
BASOS ABS: 0 10*3/uL (ref 0.0–0.1)
EOS ABS: 0 10*3/uL (ref 0.0–0.5)
EOS%: 0.3 % (ref 0.0–7.0)
HEMATOCRIT: 33.9 % — AB (ref 38.4–49.9)
HEMOGLOBIN: 11.1 g/dL — AB (ref 13.0–17.1)
LYMPH#: 0.4 10*3/uL — AB (ref 0.9–3.3)
LYMPH%: 3 % — ABNORMAL LOW (ref 14.0–49.0)
MCH: 30.6 pg (ref 27.2–33.4)
MCHC: 32.7 g/dL (ref 32.0–36.0)
MCV: 93.6 fL (ref 79.3–98.0)
MONO#: 0.6 10*3/uL (ref 0.1–0.9)
MONO%: 4.4 % (ref 0.0–14.0)
NEUT%: 91.9 % — ABNORMAL HIGH (ref 39.0–75.0)
NEUTROS ABS: 11.5 10*3/uL — AB (ref 1.5–6.5)
Platelets: 334 10*3/uL (ref 140–400)
RBC: 3.63 10*6/uL — ABNORMAL LOW (ref 4.20–5.82)
RDW: 14.7 % — AB (ref 11.0–14.6)
WBC: 12.5 10*3/uL — AB (ref 4.0–10.3)

## 2015-07-09 LAB — TSH: TSH: 1.223 m[IU]/L (ref 0.320–4.118)

## 2015-07-09 NOTE — Telephone Encounter (Signed)
appointments made and avs printed for patient °

## 2015-07-09 NOTE — Progress Notes (Signed)
.      Logan Russell [phone]   [phone]  OFFICE PROGRESS NOTE  Logan Downing, MD [place] [address]  DIAGNOSIS: Stage IIIB (T2a., N3, M0) non-small cell lung cancer consistent with invasive adenocarcinoma with negative ALK gene translocation and negative EGFR mutation diagnosed in October of 2013   PRIOR THERAPY:  1) Concurrent chemoradiation with weekly carboplatin for AUC of 2 and paclitaxel 45 mg/M2, last dose was given 06/11/2012 with partial response.  2) Consolidation chemotherapy with carboplatin for AUC of 5 and Alimta 500 mg/M2 every 3 weeks. Status post 3 cycles last dose was given 09/12/2012 with stable disease.  3) Systemic chemotherapy again with carboplatin for AUC of 5 and Alimta 500 mg/M2 every 3 weeks. First cycle expected on 11/13/2013. Status post 3 cycles, discontinued secondary to disease progression. Carboplatin was discontinued starting from cycle #2 secondary to hypersensitivity reaction. 4) Immunotherapy with single agent Nivolumab 3 MG/TG every 2 weeks status post 8 cycles.  5) Decompressive lumbar laminectomies from L4 to S1 for removal of metastatic tumor bilateral under the care of Dr. Saintclair Halsted on 05/27/2014. 6) status post palliative radiotherapy to the L4-S1 joint region of the lower spine for a total dose of 75 GYN he completed on 07/10/2014.  CURRENT THERAPY: Treatment with immunotherapy again with single agent Nivolumab 240 MG IV every 2 weeks. First dose 06/25/2015.  CHEMOTHERAPY INTENT: Palliative  CURRENT # OF CHEMOTHERAPY CYCLES: 0  CURRENT ANTIEMETICS: N/A  CURRENT SMOKING STATUS: Former Smoker  ORAL CHEMOTHERAPY AND CONSENT: None  CURRENT BISPHOSPHONATES USE: None  PAIN MANAGEMENT: 0/10  NARCOTICS INDUCED CONSTIPATION: None  LIVING WILL AND CODE STATUS: no code Blue  INTERVAL HISTORY: Logan Russell 81 y.o. male returns to the clinic today for follow-up visit accompanied  by his wife and son. The patient is not feeling well today with increasing fatigue and weakness as well as weight loss of around 9 pounds since his last visit. Everybody at home was sick recently. He has weakness in the lower extremities. He denied having any nausea or vomiting, no fever or chills, no diarrhea or skin rash. He denied having any significant chest pain, shortness of breath, cough or hemoptysis. He was supposed to start cycle #2 of Nivolumab today.  MEDICAL HISTORY: Past Medical History  Diagnosis Date  . Hypertension   . History of radiation therapy 05/02/12-06/19/12    left lung mediastinal lymph nodes 66Gy/4f  . Cauda equina syndrome ([facility]   . [place] 04/12[place]2012  . Bone cancer ([facility]     non small cell lung ca with mets to spine  . Encounter for antineoplastic immunotherapy 06/19/2015    ALLERGIES:  has No Known Allergies.  MEDICATIONS:  Current Outpatient Prescriptions  Medication Sig Dispense Refill  . acetaminophen (TYLENOL) 325 MG tablet Take 2 tablets (650 mg total) by mouth every 4 (four) hours as needed for mild pain or fever (temp > 100.5). (Patient not taking: Reported on 06/18/2015)    . bisacodyl (DULCOLAX) 10 MG suppository Place 1 suppository (10 mg total) rectally daily as needed for severe constipation. (Patient not taking: Reported on 01/28/2015)    . furosemide (LASIX) 40 MG tablet Take 40 mg by mouth daily as needed.    .Marland KitchenguaiFENesin (MUCINEX) 600 MG 12 hr tablet Take 2 tablets (1,200 mg total) by mouth 2 (two) times daily as needed for cough or to loosen phlegm.    . Oxycodone HCl 10 MG TABS  Take 10 mg by mouth every 4 (four) hours as needed. for pain  0  . polyethylene glycol (MIRALAX / GLYCOLAX) packet Take 17 g by mouth daily as needed for moderate constipation.    . promethazine (PHENERGAN) 25 MG tablet Take 25 mg by mouth every 6 (six) hours as needed for nausea or vomiting. Reported on 06/18/2015    . senna (SENOKOT) 8.6 MG TABS tablet Take 1  tablet (8.6 mg total) by mouth daily as needed for mild constipation. (Patient not taking: Reported on 01/28/2015)     No current facility-administered medications for this visit.    REVIEW OF SYSTEMS:  A comprehensive review of systems was negative except for: Constitutional: positive for anorexia, fatigue and weight loss Musculoskeletal: positive for muscle weakness   PHYSICAL EXAMINATION: General appearance: alert, cooperative, fatigued and no distress Head: Normocephalic, without obvious abnormality, atraumatic Neck: no adenopathy Lymph nodes: Cervical, supraclavicular, and axillary nodes normal. Resp: clear to auscultation bilaterally Back: symmetric, no curvature. ROM normal. No CVA tenderness. Cardio: regular rate and rhythm, S1, S2 normal, no murmur, click, rub or gallop GI: soft, non-tender; bowel sounds normal; no masses,  no organomegaly Extremities: extremities normal, atraumatic, no cyanosis or edema Neurologic: Alert and oriented X 3, normal strength and tone. Normal symmetric reflexes. Normal coordination and gait  ECOG PERFORMANCE STATUS: 2 - Symptomatic, <50% confined to bed  Blood pressure 122/59, pulse 113, temperature 98.2 F (36.8 C), temperature source Oral, resp. rate 18, height 5' 6.5" (1.689 m), weight 154 lb 14.4 oz (70.262 kg), SpO2 99 %.  LABORATORY DATA: Lab Results  Component Value Date   WBC 12.5* 07/09/2015   HGB 11.1* 07/09/2015   HCT 33.9* 07/09/2015   MCV 93.6 07/09/2015   PLT 334 07/09/2015      Chemistry      Component Value Date/Time   NA 136 06/25/2015 0925   NA 134* 06/03/2014 0356   K 3.9 06/25/2015 0925   K 3.6 06/03/2014 0356   CL 101 06/03/2014 0356   CL 106 10/08/2012 1028   CO2 20* 06/25/2015 0925   CO2 27 06/03/2014 0356   BUN 23.7 06/25/2015 0925   BUN 29* 06/03/2014 0356   CREATININE 1.3 06/25/2015 0925   CREATININE 1.66* 06/03/2014 0356      Component Value Date/Time   CALCIUM 9.3 06/25/2015 0925   CALCIUM 8.4  06/03/2014 0356   ALKPHOS 65 06/25/2015 0925   ALKPHOS 66 05/28/2014 0805   AST 8 06/25/2015 0925   AST 17 05/28/2014 0805   ALT <9 06/25/2015 0925   ALT 15 05/28/2014 0805   BILITOT 1.06 06/25/2015 0925   BILITOT 0.5 05/28/2014 0805       RADIOGRAPHIC STUDIES: Ct Abdomen Pelvis Wo Contrast  06/11/2015  CLINICAL DATA:  lung cancer diagnosed in 2013 with bone metastasis to spine. Chemotherapy and radiation therapy completed in 2014. Constipation. Renal insufficiency. EXAM: CT CHEST, ABDOMEN AND PELVIS WITHOUT CONTRAST TECHNIQUE: Multidetector CT imaging of the chest, abdomen and pelvis was performed following the standard protocol without IV contrast. COMPARISON:  01/19/2015 FINDINGS: CT CHEST Mediastinum/Nodes: Low right jugular nodes are decreased in size, including at maximally 6 mm today versus 11 mm on the prior. Mild left-sided gynecomastia. Aortic and branch vessel atherosclerosis. Normal heart size, without pericardial effusion. Multivessel coronary artery atherosclerosis. 12 mm right paratracheal node is unchanged. Hilar regions not well evaluated secondary to the radiation induced edema and lack of IV contrast the. A low left mediastinal node versus less likely  pleural-based left lower lobe pulmonary nodule is significantly enlarged. 2.2 x 1.8 cm on image 41/series 2 today. 11 x 13 mm on the prior. Enlargement of a periesophageal node on image 27/series 2 which remains small but is suspicious Lungs/Pleura: No pleural fluid.  Mild centrilobular emphysema. Lateral right lower lobe pulmonary nodule measures 6 mm on image 40/series 4. Similar to minimally increased from 5 mm on the prior exam. A right lower lobe pulmonary nodule measures 6 mm on image 47/series 4. Either new or significantly enlarged from the prior. Radiation changes in the paramediastinal lungs bilaterally. Ground-glass opacity in the superior segment right lower lobe is slightly increased and favored to be radiation induced.  More nodular central component measures maximally 8 mm on image 30/series 4. Compare 6 mm on the prior. Left lower lobe pleural-based density measures 11 mm on image 47/series 4. Smaller and more linear at 6 mm on the prior. Probable scarring in the left lung base laterally, progressive. Musculoskeletal: Sclerosis in the left sixth rib is new or increased including on image 27/series 2. CT ABDOMEN AND PELVIS Hepatobiliary: Scattered well-circumscribed low-density liver lesions which are likely cysts. Normal gallbladder, without biliary ductal dilatation. Pancreas: Normal, without mass or ductal dilatation. Periampullary duodenal diverticulum incidentally noted. Spleen: Normal in size, without focal abnormality. Adrenals/Urinary Tract: Low-density right adrenal nodule is likely an adenoma. Normal left adrenal gland. An interpolar left renal lesion is likely a cyst. No renal calculi or hydronephrosis. Foley catheter within the urinary bladder. Bladder remains mildly thick walled with surrounding edema. Stomach/Bowel: Proximal gastric underdistention. Normal colon and terminal ileum. Normal small bowel. Vascular/Lymphatic: Aortic and branch vessel atherosclerosis. Infrarenal calcified wall thrombus versus focal dissection is similar, including on image 79/series 2. Re- demonstration of bulky retroperitoneal adenopathy. Left parariaortic node measures 1.2 cm on image 72/series 2 and is similar. An aortocaval node measures 2.8 cm today versus 1.8 cm on the prior. Index retrocaval node measures 2.4 cm on image 69/series 2 versus 2.2 cm on the prior. Enlarging nodes in the root of the small bowel mesentery are suspicious, including a 12 mm node on image 65/series 2. Left external iliac node is unchanged at 1.0 cm. Lateral right inguinal node measures 1.3 cm image 98/series 2 today versus 1.0 cm on the prior. Reproductive: Moderate prostatomegaly. Other: No significant free fluid. Musculoskeletal: Sclerotic osseous  metastasis. Insufficiency fractures involving the sacrum bilaterally, chronic. IMPRESSION: CT CHEST IMPRESSION 1. Radiation changes within the chest. New and increased pulmonary nodularity, suspicious for progressive metastatic disease. 2. Enlargement of thoracic nodes, including a low left mediastinal node. 3. Sclerosis within the left sixth rib, suspicious for progressive osseous metastasis within the chest. 4. Left gynecomastia. CT ABDOMEN AND PELVIS IMPRESSION 1. Progression of bulky abdominal adenopathy with developing pelvic adenopathy. 2. Similar extensive osseous metastasis with insufficiency fractures involving the sacrum. 3. Prostatomegaly with similar bladder findings which could relate to bladder outlet obstruction and/or cystitis. Electronically Signed   By: Abigail Miyamoto M.D.   On: 06/11/2015 14:09   Ct Chest Wo Contrast  06/11/2015  CLINICAL DATA:  lung cancer diagnosed in 2013 with bone metastasis to spine. Chemotherapy and radiation therapy completed in 2014. Constipation. Renal insufficiency. EXAM: CT CHEST, ABDOMEN AND PELVIS WITHOUT CONTRAST TECHNIQUE: Multidetector CT imaging of the chest, abdomen and pelvis was performed following the standard protocol without IV contrast. COMPARISON:  01/19/2015 FINDINGS: CT CHEST Mediastinum/Nodes: Low right jugular nodes are decreased in size, including at maximally 6 mm today versus 11 mm on  the prior. Mild left-sided gynecomastia. Aortic and branch vessel atherosclerosis. Normal heart size, without pericardial effusion. Multivessel coronary artery atherosclerosis. 12 mm right paratracheal node is unchanged. Hilar regions not well evaluated secondary to the radiation induced edema and lack of IV contrast the. A low left mediastinal node versus less likely pleural-based left lower lobe pulmonary nodule is significantly enlarged. 2.2 x 1.8 cm on image 41/series 2 today. 11 x 13 mm on the prior. Enlargement of a periesophageal node on image 27/series 2 which  remains small but is suspicious Lungs/Pleura: No pleural fluid.  Mild centrilobular emphysema. Lateral right lower lobe pulmonary nodule measures 6 mm on image 40/series 4. Similar to minimally increased from 5 mm on the prior exam. A right lower lobe pulmonary nodule measures 6 mm on image 47/series 4. Either new or significantly enlarged from the prior. Radiation changes in the paramediastinal lungs bilaterally. Ground-glass opacity in the superior segment right lower lobe is slightly increased and favored to be radiation induced. More nodular central component measures maximally 8 mm on image 30/series 4. Compare 6 mm on the prior. Left lower lobe pleural-based density measures 11 mm on image 47/series 4. Smaller and more linear at 6 mm on the prior. Probable scarring in the left lung base laterally, progressive. Musculoskeletal: Sclerosis in the left sixth rib is new or increased including on image 27/series 2. CT ABDOMEN AND PELVIS Hepatobiliary: Scattered well-circumscribed low-density liver lesions which are likely cysts. Normal gallbladder, without biliary ductal dilatation. Pancreas: Normal, without mass or ductal dilatation. Periampullary duodenal diverticulum incidentally noted. Spleen: Normal in size, without focal abnormality. Adrenals/Urinary Tract: Low-density right adrenal nodule is likely an adenoma. Normal left adrenal gland. An interpolar left renal lesion is likely a cyst. No renal calculi or hydronephrosis. Foley catheter within the urinary bladder. Bladder remains mildly thick walled with surrounding edema. Stomach/Bowel: Proximal gastric underdistention. Normal colon and terminal ileum. Normal small bowel. Vascular/Lymphatic: Aortic and branch vessel atherosclerosis. Infrarenal calcified wall thrombus versus focal dissection is similar, including on image 79/series 2. Re- demonstration of bulky retroperitoneal adenopathy. Left parariaortic node measures 1.2 cm on image 72/series 2 and is  similar. An aortocaval node measures 2.8 cm today versus 1.8 cm on the prior. Index retrocaval node measures 2.4 cm on image 69/series 2 versus 2.2 cm on the prior. Enlarging nodes in the root of the small bowel mesentery are suspicious, including a 12 mm node on image 65/series 2. Left external iliac node is unchanged at 1.0 cm. Lateral right inguinal node measures 1.3 cm image 98/series 2 today versus 1.0 cm on the prior. Reproductive: Moderate prostatomegaly. Other: No significant free fluid. Musculoskeletal: Sclerotic osseous metastasis. Insufficiency fractures involving the sacrum bilaterally, chronic. IMPRESSION: CT CHEST IMPRESSION 1. Radiation changes within the chest. New and increased pulmonary nodularity, suspicious for progressive metastatic disease. 2. Enlargement of thoracic nodes, including a low left mediastinal node. 3. Sclerosis within the left sixth rib, suspicious for progressive osseous metastasis within the chest. 4. Left gynecomastia. CT ABDOMEN AND PELVIS IMPRESSION 1. Progression of bulky abdominal adenopathy with developing pelvic adenopathy. 2. Similar extensive osseous metastasis with insufficiency fractures involving the sacrum. 3. Prostatomegaly with similar bladder findings which could relate to bladder outlet obstruction and/or cystitis. Electronically Signed   By: Abigail Miyamoto M.D.   On: 06/11/2015 14:09   ASSESSMENT AND PLAN: this is a very pleasant 81 years old white male with metastatic non-small cell lung cancer initially diagnosed as stage IIIB non-small cell lung cancer status post  concurrent chemoradiation followed by consolidation chemotherapy.  He had evidence for disease recurrence and the patient was started on systemic chemotherapy with carboplatin and Alimta discontinued secondary to disease progression. He is currently undergoing immunotherapy with single agent Nivolumab status post 8 cycles. This was discontinued after the patient had progressive disease in the  lumbar spines and require surgical resection followed by palliative radiotherapy. The recent CT scan of the chest, abdomen and pelvis showed new and increased pulmonary nodularity, suspicious for progressive metastatic disease. There was also enlargement of thoracic nodes, including a low left mediastinal node and progression of bulky abdominal lymphadenopathy. The patient was started on treatment with immunotherapy with Nivolumab status post 1 cycle. He tolerated the first cycle well but over the last 2 weeks he has been complaining of increasing fatigue and weakness as well as weight loss and lack of appetite. This could be secondary to flu symptoms but adverse affects from the immunotherapy cannot be excluded at this point. I recommended for the patient to delay the start of cycle #2 by 1 week until he has improvement of his condition. I will see him back for follow-up visit in 3 weeks for reevaluation before starting cycle #3. He was advised to call immediately if he has any concerning symptoms in the interval. The patient voices understanding of current disease status and treatment options and is in agreement with the current care plan. All questions were answered. The patient knows to call the clinic with any problems, questions or concerns. We can certainly see the patient much sooner if necessary.  Disclaimer: This note was dictated with voice recognition software. Similar sounding words can inadvertently be transcribed and may not be corrected upon review.

## 2015-07-16 ENCOUNTER — Ambulatory Visit (HOSPITAL_BASED_OUTPATIENT_CLINIC_OR_DEPARTMENT_OTHER): Payer: Medicare Other

## 2015-07-16 VITALS — BP 130/63 | HR 100 | Temp 97.0°F | Resp 18

## 2015-07-16 DIAGNOSIS — Z5112 Encounter for antineoplastic immunotherapy: Secondary | ICD-10-CM | POA: Diagnosis not present

## 2015-07-16 DIAGNOSIS — C3432 Malignant neoplasm of lower lobe, left bronchus or lung: Secondary | ICD-10-CM

## 2015-07-16 DIAGNOSIS — C3411 Malignant neoplasm of upper lobe, right bronchus or lung: Secondary | ICD-10-CM

## 2015-07-16 MED ORDER — SODIUM CHLORIDE 0.9 % IV SOLN
240.0000 mg | Freq: Once | INTRAVENOUS | Status: AC
  Administered 2015-07-16: 240 mg via INTRAVENOUS
  Filled 2015-07-16: qty 24

## 2015-07-16 MED ORDER — SODIUM CHLORIDE 0.9 % IV SOLN
Freq: Once | INTRAVENOUS | Status: AC
  Administered 2015-07-16: 09:00:00 via INTRAVENOUS

## 2015-07-16 NOTE — Patient Instructions (Signed)
[facility] Discharge Instructions for Patients Receiving Chemotherapy  Today you received the following chemotherapy agents:  Nivolumab.  To help prevent nausea and vomiting after your treatment, we encourage you to take your nausea medication as directed.   If you develop nausea and vomiting that is not controlled by your nausea medication, call the clinic.   BELOW ARE SYMPTOMS THAT SHOULD BE REPORTED IMMEDIATELY:  *FEVER GREATER THAN 100.5 F  *CHILLS WITH OR WITHOUT FEVER  NAUSEA AND VOMITING THAT IS NOT CONTROLLED WITH YOUR NAUSEA MEDICATION  *UNUSUAL SHORTNESS OF BREATH  *UNUSUAL BRUISING OR BLEEDING  TENDERNESS IN MOUTH AND THROAT WITH OR WITHOUT PRESENCE OF ULCERS  *URINARY PROBLEMS  *BOWEL PROBLEMS  UNUSUAL RASH Items with * indicate a potential emergency and should be followed up as soon as possible.  Feel free to call the clinic you have any questions or concerns. The clinic phone number is [phone].  Please show the CHEMO ALERT CARD at check-in to the Emergency Department and triage nurse.   

## 2015-07-23 ENCOUNTER — Other Ambulatory Visit: Payer: Medicare Other

## 2015-07-23 ENCOUNTER — Ambulatory Visit: Payer: Medicare Other

## 2015-07-23 ENCOUNTER — Ambulatory Visit: Payer: Medicare Other | Admitting: Internal Medicine

## 2015-07-30 ENCOUNTER — Other Ambulatory Visit (HOSPITAL_BASED_OUTPATIENT_CLINIC_OR_DEPARTMENT_OTHER): Payer: Medicare Other

## 2015-07-30 ENCOUNTER — Telehealth: Payer: Self-pay | Admitting: Internal Medicine

## 2015-07-30 ENCOUNTER — Ambulatory Visit (HOSPITAL_BASED_OUTPATIENT_CLINIC_OR_DEPARTMENT_OTHER): Payer: Medicare Other | Admitting: Internal Medicine

## 2015-07-30 ENCOUNTER — Encounter: Payer: Self-pay | Admitting: Internal Medicine

## 2015-07-30 ENCOUNTER — Telehealth: Payer: Self-pay | Admitting: *Deleted

## 2015-07-30 ENCOUNTER — Telehealth: Payer: Self-pay | Admitting: Medical Oncology

## 2015-07-30 ENCOUNTER — Ambulatory Visit (HOSPITAL_BASED_OUTPATIENT_CLINIC_OR_DEPARTMENT_OTHER): Payer: Medicare Other

## 2015-07-30 VITALS — BP 140/82 | HR 89 | Temp 95.8°F | Resp 18 | Ht 66.5 in | Wt 158.4 lb

## 2015-07-30 DIAGNOSIS — C7951 Secondary malignant neoplasm of bone: Secondary | ICD-10-CM | POA: Diagnosis not present

## 2015-07-30 DIAGNOSIS — Z79899 Other long term (current) drug therapy: Secondary | ICD-10-CM | POA: Diagnosis not present

## 2015-07-30 DIAGNOSIS — C3411 Malignant neoplasm of upper lobe, right bronchus or lung: Secondary | ICD-10-CM

## 2015-07-30 DIAGNOSIS — Z5112 Encounter for antineoplastic immunotherapy: Secondary | ICD-10-CM

## 2015-07-30 DIAGNOSIS — C3490 Malignant neoplasm of unspecified part of unspecified bronchus or lung: Secondary | ICD-10-CM | POA: Diagnosis not present

## 2015-07-30 LAB — COMPREHENSIVE METABOLIC PANEL
ALBUMIN: 2.7 g/dL — AB (ref 3.5–5.0)
ALT: 11 U/L (ref 0–55)
AST: 9 U/L (ref 5–34)
Alkaline Phosphatase: 82 U/L (ref 40–150)
Anion Gap: 9 mEq/L (ref 3–11)
BILIRUBIN TOTAL: 0.39 mg/dL (ref 0.20–1.20)
BUN: 19.8 mg/dL (ref 7.0–26.0)
CO2: 24 meq/L (ref 22–29)
CREATININE: 1.1 mg/dL (ref 0.7–1.3)
Calcium: 9.2 mg/dL (ref 8.4–10.4)
Chloride: 103 mEq/L (ref 98–109)
EGFR: 62 mL/min/{1.73_m2} — AB (ref >90)
GLUCOSE: 86 mg/dL (ref 70–140)
Potassium: 3.7 mEq/L (ref 3.5–5.1)
SODIUM: 137 meq/L (ref 136–145)
TOTAL PROTEIN: 7.4 g/dL (ref 6.4–8.3)

## 2015-07-30 LAB — CBC WITH DIFFERENTIAL/PLATELET
BASO%: 0.1 % (ref 0.0–2.0)
Basophils Absolute: 0 10*3/uL (ref 0.0–0.1)
EOS%: 0.6 % (ref 0.0–7.0)
Eosinophils Absolute: 0.1 10*3/uL (ref 0.0–0.5)
HCT: 31.2 % — ABNORMAL LOW (ref 38.4–49.9)
HEMOGLOBIN: 10.1 g/dL — AB (ref 13.0–17.1)
LYMPH%: 8.8 % — ABNORMAL LOW (ref 14.0–49.0)
MCH: 29.9 pg (ref 27.2–33.4)
MCHC: 32.4 g/dL (ref 32.0–36.0)
MCV: 92.3 fL (ref 79.3–98.0)
MONO#: 0.6 10*3/uL (ref 0.1–0.9)
MONO%: 6.5 % (ref 0.0–14.0)
NEUT%: 84 % — ABNORMAL HIGH (ref 39.0–75.0)
NEUTROS ABS: 7.1 10*3/uL — AB (ref 1.5–6.5)
Platelets: 250 10*3/uL (ref 140–400)
RBC: 3.38 10*6/uL — ABNORMAL LOW (ref 4.20–5.82)
RDW: 14.8 % — AB (ref 11.0–14.6)
WBC: 8.5 10*3/uL (ref 4.0–10.3)
lymph#: 0.8 10*3/uL — ABNORMAL LOW (ref 0.9–3.3)

## 2015-07-30 LAB — TSH: TSH: 2.124 m[IU]/L (ref 0.320–4.118)

## 2015-07-30 MED ORDER — NIVOLUMAB CHEMO INJECTION 100 MG/10ML
240.0000 mg | Freq: Once | INTRAVENOUS | Status: AC
  Administered 2015-07-30: 240 mg via INTRAVENOUS
  Filled 2015-07-30: qty 20

## 2015-07-30 MED ORDER — SODIUM CHLORIDE 0.9 % IV SOLN
Freq: Once | INTRAVENOUS | Status: AC
  Administered 2015-07-30: 13:00:00 via INTRAVENOUS

## 2015-07-30 NOTE — Telephone Encounter (Signed)
Per staff message and POF I have scheduled appts. Advised scheduler of appts. JMW  

## 2015-07-30 NOTE — Telephone Encounter (Signed)
I left message for Home health to change foley catheter and per their protocol.

## 2015-07-30 NOTE — Progress Notes (Signed)
.      [facility] [phone]) [phone]   Fax:(336) [phone]  OFFICE PROGRESS NOTE  [name], MD [place] [address]  DIAGNOSIS: Stage IIIB (T2a., N3, M0) non-small cell lung cancer consistent with invasive adenocarcinoma with negative ALK gene translocation and negative EGFR mutation diagnosed in October of 2013   PRIOR THERAPY:  1) Concurrent chemoradiation with weekly carboplatin for AUC of 2 and paclitaxel 45 mg/M2, last dose was given 06/11/2012 with partial response.  2) Consolidation chemotherapy with carboplatin for AUC of 5 and Alimta 500 mg/M2 every 3 weeks. Status post 3 cycles last dose was given 09/12/2012 with stable disease.  3) Systemic chemotherapy again with carboplatin for AUC of 5 and Alimta 500 mg/M2 every 3 weeks. First cycle expected on 11/13/2013. Status post 3 cycles, discontinued secondary to disease progression. Carboplatin was discontinued starting from cycle #2 secondary to hypersensitivity reaction. 4) Immunotherapy with single agent Nivolumab 3 MG/TG every 2 weeks status post 8 cycles.  5) Decompressive lumbar laminectomies from L4 to S1 for removal of metastatic tumor bilateral under the care of Dr. Saintclair Halsted on 05/27/2014. 6) status post palliative radiotherapy to the L4-S1 joint region of the lower spine for a total dose of 75 GYN he completed on 07/10/2014.  CURRENT THERAPY: Treatment with immunotherapy again with single agent Nivolumab 240 MG IV every 2 weeks. First dose 06/25/2015. Status post 2 cycles  CHEMOTHERAPY INTENT: Palliative  CURRENT # OF CHEMOTHERAPY CYCLES: 3  CURRENT ANTIEMETICS: N/A  CURRENT SMOKING STATUS: Former Smoker  ORAL CHEMOTHERAPY AND CONSENT: None  CURRENT BISPHOSPHONATES USE: None  PAIN MANAGEMENT: 0/10  NARCOTICS INDUCED CONSTIPATION: None  LIVING WILL AND CODE STATUS: no code Blue  INTERVAL HISTORY: Logan Russell 81 y.o. male returns to the clinic today for  follow-up visit accompanied by his wife. He is currently on treatment with immunotherapy with Nivolumab status post 2 cycles and tolerating his treatment fairly well except for the persistent fatigue. He has weakness in the lower extremities. He denied having any nausea or vomiting, no fever or chills, no diarrhea or skin rash. He denied having any significant chest pain, shortness of breath, cough or hemoptysis. He was supposed to start cycle #3 of Nivolumab today.  MEDICAL HISTORY: Past Medical History  Diagnosis Date  . Hypertension   . History of radiation therapy 05/02/12-06/19/12    left lung mediastinal lymph nodes 66Gy/45f  . Cauda equina syndrome [facility]   . [facility] [place] [place]  . [place]     non small cell lung ca with mets to spine  . Encounter for antineoplastic immunotherapy 06/19/2015    ALLERGIES:  has No Known Allergies.  MEDICATIONS:  Current Outpatient Prescriptions  Medication Sig Dispense Refill  . acetaminophen (TYLENOL) 325 MG tablet Take 2 tablets (650 mg total) by mouth every 4 (four) hours as needed for mild pain or fever (temp > 100.5).    . bisacodyl (DULCOLAX) 10 MG suppository Place 1 suppository (10 mg total) rectally daily as needed for severe constipation.    . furosemide (LASIX) 40 MG tablet Take 40 mg by mouth daily as needed.    .Marland KitchenguaiFENesin (MUCINEX) 600 MG 12 hr tablet Take 2 tablets (1,200 mg total) by mouth 2 (two) times daily as needed for cough or to loosen phlegm.    . Oxycodone HCl 10 MG TABS Take 10 mg by mouth every 4 (four) hours as needed. for pain  0  .  polyethylene glycol (MIRALAX / GLYCOLAX) packet Take 17 g by mouth daily as needed for moderate constipation.    . promethazine (PHENERGAN) 25 MG tablet Take 25 mg by mouth every 6 (six) hours as needed for nausea or vomiting. Reported on 06/18/2015    . senna (SENOKOT) 8.6 MG TABS tablet Take 1 tablet (8.6 mg total) by mouth daily as needed for mild constipation.     No  current facility-administered medications for this visit.    REVIEW OF SYSTEMS:  A comprehensive review of systems was negative except for: Constitutional: positive for fatigue Musculoskeletal: positive for muscle weakness   PHYSICAL EXAMINATION: General appearance: alert, cooperative, fatigued and no distress Head: Normocephalic, without obvious abnormality, atraumatic Neck: no adenopathy Lymph nodes: Cervical, supraclavicular, and axillary nodes normal. Resp: clear to auscultation bilaterally Back: symmetric, no curvature. ROM normal. No CVA tenderness. Cardio: regular rate and rhythm, S1, S2 normal, no murmur, click, rub or gallop GI: soft, non-tender; bowel sounds normal; no masses,  no organomegaly Extremities: extremities normal, atraumatic, no cyanosis or edema Neurologic: Alert and oriented X 3, normal strength and tone. Normal symmetric reflexes. Normal coordination and gait  ECOG PERFORMANCE STATUS: 2 - Symptomatic, <50% confined to bed  Blood pressure 140/82, pulse 89, temperature 95.8 F (35.4 C), temperature source Oral, resp. rate 18, height 5' 6.5" (1.689 m), weight 158 lb 6.4 oz (71.85 kg), SpO2 100 %.  LABORATORY DATA: Lab Results  Component Value Date   WBC 8.5 07/30/2015   HGB 10.1* 07/30/2015   HCT 31.2* 07/30/2015   MCV 92.3 07/30/2015   PLT 250 07/30/2015      Chemistry      Component Value Date/Time   NA 137 07/30/2015 1011   NA 134* 06/03/2014 0356   K 3.7 07/30/2015 1011   K 3.6 06/03/2014 0356   CL 101 06/03/2014 0356   CL 106 10/08/2012 1028   CO2 24 07/30/2015 1011   CO2 27 06/03/2014 0356   BUN 19.8 07/30/2015 1011   BUN 29* 06/03/2014 0356   CREATININE 1.1 07/30/2015 1011   CREATININE 1.66* 06/03/2014 0356      Component Value Date/Time   CALCIUM 9.2 07/30/2015 1011   CALCIUM 8.4 06/03/2014 0356   ALKPHOS 82 07/30/2015 1011   ALKPHOS 66 05/28/2014 0805   AST 9 07/30/2015 1011   AST 17 05/28/2014 0805   ALT 11 07/30/2015 1011   ALT  15 05/28/2014 0805   BILITOT 0.39 07/30/2015 1011   BILITOT 0.5 05/28/2014 0805       RADIOGRAPHIC STUDIES: No results found. ASSESSMENT AND PLAN: this is a very pleasant 81 years old white male with metastatic non-small cell lung cancer initially diagnosed as stage IIIB non-small cell lung cancer status post concurrent chemoradiation followed by consolidation chemotherapy.  He had evidence for disease recurrence and the patient was started on systemic chemotherapy with carboplatin and Alimta discontinued secondary to disease progression. He is currently undergoing immunotherapy with single agent Nivolumab status post 8 cycles. This was discontinued after the patient had progressive disease in the lumbar spines and require surgical resection followed by palliative radiotherapy. The recent CT scan of the chest, abdomen and pelvis showed new and increased pulmonary nodularity, suspicious for progressive metastatic disease. There was also enlargement of thoracic nodes, including a low left mediastinal node and progression of bulky abdominal lymphadenopathy. The patient was started on treatment with immunotherapy with Nivolumab status post 2 cycles. He is tolerating the treatment well except for the persistent fatigue.  I recommended for him to proceed with cycle #3 today as a scheduled. I will see him back for follow-up visit in 2 weeks for reevaluation before starting cycle #4. He is requesting help from the home health at the [facility] to help him changing his Foley's cather on monthly basis rather than driving to the urology Center. We will make the referral for him. He was advised to call immediately if he has any concerning symptoms in the interval. The patient voices understanding of current disease status and treatment options and is in agreement with the current care plan. All questions were answered. The patient knows to call the clinic with any problems, questions or concerns. We can  certainly see the patient much sooner if necessary.  Disclaimer: This note was dictated with voice recognition software. Similar sounding words can inadvertently be transcribed and may not be corrected upon review.

## 2015-07-30 NOTE — Telephone Encounter (Signed)
per pof to sch pt appt-gave pt copy of avs-sent MW email to sch trmt-pt to get updated copy of sch b4 leaving trmt

## 2015-08-13 ENCOUNTER — Other Ambulatory Visit (HOSPITAL_BASED_OUTPATIENT_CLINIC_OR_DEPARTMENT_OTHER): Payer: Medicare Other

## 2015-08-13 ENCOUNTER — Ambulatory Visit (HOSPITAL_BASED_OUTPATIENT_CLINIC_OR_DEPARTMENT_OTHER): Payer: Medicare Other

## 2015-08-13 ENCOUNTER — Other Ambulatory Visit: Payer: Self-pay | Admitting: Medical Oncology

## 2015-08-13 VITALS — BP 116/69 | HR 97 | Temp 98.5°F | Resp 18

## 2015-08-13 DIAGNOSIS — Z5112 Encounter for antineoplastic immunotherapy: Secondary | ICD-10-CM

## 2015-08-13 DIAGNOSIS — C3411 Malignant neoplasm of upper lobe, right bronchus or lung: Secondary | ICD-10-CM

## 2015-08-13 DIAGNOSIS — C3432 Malignant neoplasm of lower lobe, left bronchus or lung: Secondary | ICD-10-CM

## 2015-08-13 DIAGNOSIS — E876 Hypokalemia: Secondary | ICD-10-CM | POA: Insufficient documentation

## 2015-08-13 LAB — CBC WITH DIFFERENTIAL/PLATELET
BASO%: 0.1 % (ref 0.0–2.0)
Basophils Absolute: 0 10*3/uL (ref 0.0–0.1)
EOS ABS: 0.1 10*3/uL (ref 0.0–0.5)
EOS%: 0.6 % (ref 0.0–7.0)
HCT: 29.2 % — ABNORMAL LOW (ref 38.4–49.9)
HGB: 9.5 g/dL — ABNORMAL LOW (ref 13.0–17.1)
LYMPH%: 4.9 % — AB (ref 14.0–49.0)
MCH: 29.9 pg (ref 27.2–33.4)
MCHC: 32.5 g/dL (ref 32.0–36.0)
MCV: 91.8 fL (ref 79.3–98.0)
MONO#: 0.5 10*3/uL (ref 0.1–0.9)
MONO%: 6.2 % (ref 0.0–14.0)
NEUT%: 88.2 % — ABNORMAL HIGH (ref 39.0–75.0)
NEUTROS ABS: 7 10*3/uL — AB (ref 1.5–6.5)
Platelets: 272 10*3/uL (ref 140–400)
RBC: 3.18 10*6/uL — AB (ref 4.20–5.82)
RDW: 16.1 % — ABNORMAL HIGH (ref 11.0–14.6)
WBC: 7.9 10*3/uL (ref 4.0–10.3)
lymph#: 0.4 10*3/uL — ABNORMAL LOW (ref 0.9–3.3)

## 2015-08-13 LAB — COMPREHENSIVE METABOLIC PANEL
ALBUMIN: 3 g/dL — AB (ref 3.5–5.0)
ALK PHOS: 82 U/L (ref 40–150)
ALT: 10 U/L (ref 0–55)
AST: 11 U/L (ref 5–34)
Anion Gap: 11 mEq/L (ref 3–11)
BUN: 19.8 mg/dL (ref 7.0–26.0)
CALCIUM: 8.9 mg/dL (ref 8.4–10.4)
CO2: 27 mEq/L (ref 22–29)
CREATININE: 1.3 mg/dL (ref 0.7–1.3)
Chloride: 100 mEq/L (ref 98–109)
EGFR: 51 mL/min/{1.73_m2} — ABNORMAL LOW (ref >90)
Glucose: 87 mg/dl (ref 70–140)
Potassium: 2.8 mEq/L — CL (ref 3.5–5.1)
Sodium: 138 mEq/L (ref 136–145)
Total Bilirubin: 0.42 mg/dL (ref 0.20–1.20)
Total Protein: 7.5 g/dL (ref 6.4–8.3)

## 2015-08-13 MED ORDER — SODIUM CHLORIDE 0.9 % IV SOLN
Freq: Once | INTRAVENOUS | Status: AC
  Administered 2015-08-13: 11:00:00 via INTRAVENOUS

## 2015-08-13 MED ORDER — SODIUM CHLORIDE 0.9 % IV SOLN
240.0000 mg | Freq: Once | INTRAVENOUS | Status: AC
  Administered 2015-08-13: 240 mg via INTRAVENOUS
  Filled 2015-08-13: qty 8

## 2015-08-13 MED ORDER — POTASSIUM CHLORIDE CRYS ER 20 MEQ PO TBCR: 20.0000 meq | EXTENDED_RELEASE_TABLET | Freq: Two times a day (BID) | ORAL | Status: DC

## 2015-08-13 NOTE — Progress Notes (Signed)
At 1140 potassium level reported to Abelina Bachelor, RN who will report to Dr. Julien Nordmann.

## 2015-08-13 NOTE — Patient Instructions (Signed)
[facility] Discharge Instructions for Patients Receiving Chemotherapy  Today you received the following chemotherapy agents:  Nivolumab.  To help prevent nausea and vomiting after your treatment, we encourage you to take your nausea medication as directed.   If you develop nausea and vomiting that is not controlled by your nausea medication, call the clinic.   BELOW ARE SYMPTOMS THAT SHOULD BE REPORTED IMMEDIATELY:  *FEVER GREATER THAN 100.5 F  *CHILLS WITH OR WITHOUT FEVER  NAUSEA AND VOMITING THAT IS NOT CONTROLLED WITH YOUR NAUSEA MEDICATION  *UNUSUAL SHORTNESS OF BREATH  *UNUSUAL BRUISING OR BLEEDING  TENDERNESS IN MOUTH AND THROAT WITH OR WITHOUT PRESENCE OF ULCERS  *URINARY PROBLEMS  *BOWEL PROBLEMS  UNUSUAL RASH Items with * indicate a potential emergency and should be followed up as soon as possible.  Feel free to call the clinic you have any questions or concerns. The clinic phone number is [phone].  Please show the CHEMO ALERT CARD at check-in to the Emergency Department and triage nurse.   

## 2015-08-25 ENCOUNTER — Other Ambulatory Visit: Payer: Self-pay | Admitting: Family Medicine

## 2015-08-25 DIAGNOSIS — I6389 Other cerebral infarction: Secondary | ICD-10-CM

## 2015-08-25 DIAGNOSIS — I639 Cerebral infarction, unspecified: Secondary | ICD-10-CM

## 2015-08-27 ENCOUNTER — Encounter: Payer: Self-pay | Admitting: Internal Medicine

## 2015-08-27 ENCOUNTER — Ambulatory Visit (HOSPITAL_BASED_OUTPATIENT_CLINIC_OR_DEPARTMENT_OTHER): Payer: Medicare Other

## 2015-08-27 ENCOUNTER — Telehealth: Payer: Self-pay | Admitting: *Deleted

## 2015-08-27 ENCOUNTER — Ambulatory Visit (HOSPITAL_BASED_OUTPATIENT_CLINIC_OR_DEPARTMENT_OTHER): Payer: Medicare Other | Admitting: Internal Medicine

## 2015-08-27 ENCOUNTER — Other Ambulatory Visit (HOSPITAL_BASED_OUTPATIENT_CLINIC_OR_DEPARTMENT_OTHER): Payer: Medicare Other

## 2015-08-27 ENCOUNTER — Telehealth: Payer: Self-pay | Admitting: Internal Medicine

## 2015-08-27 VITALS — BP 122/67 | HR 98 | Temp 98.2°F | Resp 18 | Ht 66.5 in | Wt 154.7 lb

## 2015-08-27 DIAGNOSIS — Z79899 Other long term (current) drug therapy: Secondary | ICD-10-CM | POA: Diagnosis not present

## 2015-08-27 DIAGNOSIS — C3411 Malignant neoplasm of upper lobe, right bronchus or lung: Secondary | ICD-10-CM

## 2015-08-27 DIAGNOSIS — C7951 Secondary malignant neoplasm of bone: Secondary | ICD-10-CM

## 2015-08-27 DIAGNOSIS — C3490 Malignant neoplasm of unspecified part of unspecified bronchus or lung: Secondary | ICD-10-CM

## 2015-08-27 DIAGNOSIS — Z5112 Encounter for antineoplastic immunotherapy: Secondary | ICD-10-CM | POA: Diagnosis not present

## 2015-08-27 LAB — COMPREHENSIVE METABOLIC PANEL
ALT: 9 U/L (ref 0–55)
ANION GAP: 9 meq/L (ref 3–11)
AST: 14 U/L (ref 5–34)
Albumin: 2.9 g/dL — ABNORMAL LOW (ref 3.5–5.0)
Alkaline Phosphatase: 68 U/L (ref 40–150)
BILIRUBIN TOTAL: 0.34 mg/dL (ref 0.20–1.20)
BUN: 20.8 mg/dL (ref 7.0–26.0)
CALCIUM: 8.9 mg/dL (ref 8.4–10.4)
CHLORIDE: 103 meq/L (ref 98–109)
CO2: 27 mEq/L (ref 22–29)
Creatinine: 1.2 mg/dL (ref 0.7–1.3)
EGFR: 56 mL/min/{1.73_m2} — ABNORMAL LOW (ref >90)
Glucose: 87 mg/dl (ref 70–140)
POTASSIUM: 3.5 meq/L (ref 3.5–5.1)
Sodium: 139 mEq/L (ref 136–145)
Total Protein: 7.2 g/dL (ref 6.4–8.3)

## 2015-08-27 LAB — CBC WITH DIFFERENTIAL/PLATELET
BASO%: 0.3 % (ref 0.0–2.0)
BASOS ABS: 0 10*3/uL (ref 0.0–0.1)
EOS ABS: 0.1 10*3/uL (ref 0.0–0.5)
EOS%: 1.8 % (ref 0.0–7.0)
HCT: 27.9 % — ABNORMAL LOW (ref 38.4–49.9)
HEMOGLOBIN: 9 g/dL — AB (ref 13.0–17.1)
LYMPH%: 7 % — AB (ref 14.0–49.0)
MCH: 29.2 pg (ref 27.2–33.4)
MCHC: 32.3 g/dL (ref 32.0–36.0)
MCV: 90.2 fL (ref 79.3–98.0)
MONO#: 0.4 10*3/uL (ref 0.1–0.9)
MONO%: 5.8 % (ref 0.0–14.0)
NEUT#: 6.4 10*3/uL (ref 1.5–6.5)
NEUT%: 85.1 % — ABNORMAL HIGH (ref 39.0–75.0)
Platelets: 317 10*3/uL (ref 140–400)
RBC: 3.09 10*6/uL — AB (ref 4.20–5.82)
RDW: 17.9 % — AB (ref 11.0–14.6)
WBC: 7.6 10*3/uL (ref 4.0–10.3)
lymph#: 0.5 10*3/uL — ABNORMAL LOW (ref 0.9–3.3)

## 2015-08-27 LAB — TSH: TSH: 1.667 m[IU]/L (ref 0.320–4.118)

## 2015-08-27 MED ORDER — SODIUM CHLORIDE 0.9 % IV SOLN
240.0000 mg | Freq: Once | INTRAVENOUS | Status: AC
  Administered 2015-08-27: 240 mg via INTRAVENOUS
  Filled 2015-08-27: qty 8

## 2015-08-27 MED ORDER — SODIUM CHLORIDE 0.9 % IV SOLN
Freq: Once | INTRAVENOUS | Status: AC
  Administered 2015-08-27: 13:00:00 via INTRAVENOUS

## 2015-08-27 NOTE — Progress Notes (Signed)
[facility] [phone]   Fax:(336) [phone]  OFFICE PROGRESS NOTE  [name], MD [address] [place] [address]  DIAGNOSIS: Stage IIIB (T2a., N3, M0) non-small cell lung cancer consistent with invasive adenocarcinoma with negative ALK gene translocation and negative EGFR mutation diagnosed in October of 2013   PRIOR THERAPY:  1) Concurrent chemoradiation with weekly carboplatin for AUC of 2 and paclitaxel 45 mg/M2, last dose was given 06/11/2012 with partial response.  2) Consolidation chemotherapy with carboplatin for AUC of 5 and Alimta 500 mg/M2 every 3 weeks. Status post 3 cycles last dose was given 09/12/2012 with stable disease.  3) Systemic chemotherapy again with carboplatin for AUC of 5 and Alimta 500 mg/M2 every 3 weeks. First cycle expected on 11/13/2013. Status post 3 cycles, discontinued secondary to disease progression. Carboplatin was discontinued starting from cycle #2 secondary to hypersensitivity reaction. 4) Immunotherapy with single agent Nivolumab 3 MG/TG every 2 weeks status post 8 cycles.  5) Decompressive lumbar laminectomies from L4 to S1 for removal of metastatic tumor bilateral under the care of Dr. Wynetta Emery on 05/27/2014. 6) status post palliative radiotherapy to the L4-S1 joint region of the lower spine for a total dose of 75 GYN he completed on 07/10/2014.  CURRENT THERAPY: Treatment with immunotherapy again with single agent Nivolumab 240 MG IV every 2 weeks. First dose 06/25/2015. Status post 3 cycles  CHEMOTHERAPY INTENT: Palliative  CURRENT # OF CHEMOTHERAPY CYCLES: 4  CURRENT ANTIEMETICS: N/A  CURRENT SMOKING STATUS: Former Smoker  ORAL CHEMOTHERAPY AND CONSENT: None  CURRENT BISPHOSPHONATES USE: None  PAIN MANAGEMENT: 0/10  NARCOTICS INDUCED CONSTIPATION: None  LIVING WILL AND CODE STATUS: no code Blue  INTERVAL HISTORY: Logan Russell 80 y.o. male returns to the clinic today for  follow-up visit accompanied by his wife. He is currently on treatment with immunotherapy with Nivolumab status post 3 cycles and tolerating his treatment fairly well except for the persistent fatigue. The patient had an episode of questionable TIA yesterday. He was seen by his primary care physician and MRI of the brain showed no evidence for stroke or any other abnormality. The symptoms completely resolved at this point. He continues to have persistent weakness in the lower extremities. He denied having any nausea or vomiting, no fever or chills, no diarrhea or skin rash. He denied having any significant chest pain, shortness of breath, cough or hemoptysis. He was supposed to start cycle #4 of Nivolumab today.  MEDICAL HISTORY: Past Medical History  Diagnosis Date  . Hypertension   . History of radiation therapy 05/02/12-06/19/12    left lung mediastinal lymph nodes 66Gy/80fx  . Cauda equina syndrome ([facility])   . Lung cancer ([facility]) 04/12/2012  . 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 Encounter for antineoplastic immunotherapy 06/19/2015    ALLERGIES:  has No Known Allergies.  MEDICATIONS:  Current Outpatient Prescriptions  Medication Sig Dispense Refill  . furosemide (LASIX) 40 MG tablet Take 40 mg by mouth daily as needed.    Marland Kitchen guaiFENesin (MUCINEX) 600 MG 12 hr tablet Take 2 tablets (1,200 mg total) by mouth 2 (two) times daily as needed for cough or to loosen phlegm.    . Oxycodone HCl 10 MG TABS Take 10 mg by mouth every 4 (four) hours as needed. for pain  0  . polyethylene glycol (MIRALAX / GLYCOLAX) packet Take 17 g by mouth daily  as needed for moderate constipation.    . potassium chloride SA (K-DUR,KLOR-CON) 20 MEQ tablet Take 1 tablet (20 mEq total) by mouth 2 (two) times daily. 14 tablet 0  . acetaminophen (TYLENOL) 325 MG tablet Take 2 tablets (650 mg total) by mouth every 4 (four) hours as needed for mild pain or fever (temp > 100.5). (Patient not taking:  Reported on 08/27/2015)    . bisacodyl (DULCOLAX) 10 MG suppository Place 1 suppository (10 mg total) rectally daily as needed for severe constipation. (Patient not taking: Reported on 08/27/2015)    . promethazine (PHENERGAN) 25 MG tablet Take 25 mg by mouth every 6 (six) hours as needed for nausea or vomiting. Reported on 08/27/2015    . senna (SENOKOT) 8.6 MG TABS tablet Take 1 tablet (8.6 mg total) by mouth daily as needed for mild constipation. (Patient not taking: Reported on 08/27/2015)     No current facility-administered medications for this visit.    REVIEW OF SYSTEMS:  A comprehensive review of systems was negative except for: Constitutional: positive for fatigue Musculoskeletal: positive for muscle weakness   PHYSICAL EXAMINATION: General appearance: alert, cooperative, fatigued and no distress Head: Normocephalic, without obvious abnormality, atraumatic Neck: no adenopathy Lymph nodes: Cervical, supraclavicular, and axillary nodes normal. Resp: clear to auscultation bilaterally Back: symmetric, no curvature. ROM normal. No CVA tenderness. Cardio: regular rate and rhythm, S1, S2 normal, no murmur, click, rub or gallop GI: soft, non-tender; bowel sounds normal; no masses,  no organomegaly Extremities: extremities normal, atraumatic, no cyanosis or edema Neurologic: Alert and oriented X 3, normal strength and tone. Normal symmetric reflexes. Normal coordination and gait  ECOG PERFORMANCE STATUS: 2 - Symptomatic, <50% confined to bed  Blood pressure 122/67, pulse 98, temperature 98.2 F (36.8 C), temperature source Oral, resp. rate 18, height 5' 6.5" (1.689 m), weight 154 lb 11.2 oz (70.171 kg), SpO2 100 %.  LABORATORY DATA: Lab Results  Component Value Date   WBC 7.6 08/27/2015   HGB 9.0* 08/27/2015   HCT 27.9* 08/27/2015   MCV 90.2 08/27/2015   PLT 317 08/27/2015      Chemistry      Component Value Date/Time   NA 139 08/27/2015 1121   NA 134* 06/03/2014 0356   K 3.5  08/27/2015 1121   K 3.6 06/03/2014 0356   CL 101 06/03/2014 0356   CL 106 10/08/2012 1028   CO2 27 08/27/2015 1121   CO2 27 06/03/2014 0356   BUN 20.8 08/27/2015 1121   BUN 29* 06/03/2014 0356   CREATININE 1.2 08/27/2015 1121   CREATININE 1.66* 06/03/2014 0356      Component Value Date/Time   CALCIUM 8.9 08/27/2015 1121   CALCIUM 8.4 06/03/2014 0356   ALKPHOS 68 08/27/2015 1121   ALKPHOS 66 05/28/2014 0805   AST 14 08/27/2015 1121   AST 17 05/28/2014 0805   ALT 9 08/27/2015 1121   ALT 15 05/28/2014 0805   BILITOT 0.34 08/27/2015 1121   BILITOT 0.5 05/28/2014 0805       RADIOGRAPHIC STUDIES: No results found. ASSESSMENT AND PLAN: this is a very pleasant 81 years old white male with metastatic non-small cell lung cancer initially diagnosed as stage IIIB non-small cell lung cancer status post concurrent chemoradiation followed by consolidation chemotherapy.  He had evidence for disease recurrence and the patient was started on systemic chemotherapy with carboplatin and Alimta discontinued secondary to disease progression. He is currently undergoing immunotherapy with single agent Nivolumab status post 8 cycles. This was discontinued  after the patient had progressive disease in the lumbar spines and require surgical resection followed by palliative radiotherapy. The recent CT scan of the chest, abdomen and pelvis showed new and increased pulmonary nodularity, suspicious for progressive metastatic disease. There was also enlargement of thoracic nodes, including a low left mediastinal node and progression of bulky abdominal lymphadenopathy. The patient was started on treatment with immunotherapy with Nivolumab status post 3 cycles. He is tolerating the treatment well except for the persistent fatigue. I recommended for him to proceed with cycle #4 today as a scheduled. I will see him back for follow-up visit in 2 weeks for reevaluation before starting cycle #5 after repeating CT scan of  the chest, abdomen and pelvis for restaging of his disease. He was advised to call immediately if he has any concerning symptoms in the interval. The patient voices understanding of current disease status and treatment options and is in agreement with the current care plan. All questions were answered. The patient knows to call the clinic with any problems, questions or concerns. We can certainly see the patient much sooner if necessary.  Disclaimer: This note was dictated with voice recognition software. Similar sounding words can inadvertently be transcribed and may not be corrected upon review.

## 2015-08-27 NOTE — Telephone Encounter (Signed)
Per staff message and POF I have scheduled appts. Advised scheduler of appts. JMW  

## 2015-08-27 NOTE — Telephone Encounter (Signed)
per pof to sch pt appt-sent MW emailto sch trmt-pt to getupdated copy b4 leaving**

## 2015-08-27 NOTE — Patient Instructions (Signed)
[facility] Discharge Instructions for Patients Receiving Chemotherapy  Today you received the following chemotherapy agents Nivolumab.  To help prevent nausea and vomiting after your treatment, we encourage you to take your nausea medication as prescribed.   If you develop nausea and vomiting that is not controlled by your nausea medication, call the clinic.   BELOW ARE SYMPTOMS THAT SHOULD BE REPORTED IMMEDIATELY:  *FEVER GREATER THAN 100.5 F  *CHILLS WITH OR WITHOUT FEVER  NAUSEA AND VOMITING THAT IS NOT CONTROLLED WITH YOUR NAUSEA MEDICATION  *UNUSUAL SHORTNESS OF BREATH  *UNUSUAL BRUISING OR BLEEDING  TENDERNESS IN MOUTH AND THROAT WITH OR WITHOUT PRESENCE OF ULCERS  *URINARY PROBLEMS  *BOWEL PROBLEMS  UNUSUAL RASH Items with * indicate a potential emergency and should be followed up as soon as possible.  Feel free to call the clinic you have any questions or concerns. The clinic phone number is [phone].  Please show the CHEMO ALERT CARD at check-in to the Emergency Department and triage nurse.   

## 2015-08-28 ENCOUNTER — Telehealth: Payer: Self-pay | Admitting: Medical Oncology

## 2015-08-28 NOTE — Telephone Encounter (Addendum)
Logan Russell to fax Order  from  [facility] for monthly foley catheter change.

## 2015-09-07 ENCOUNTER — Ambulatory Visit (HOSPITAL_COMMUNITY)
Admission: RE | Admit: 2015-09-07 | Discharge: 2015-09-07 | Disposition: A | Discharge status: Home / Self Care | Payer: Medicare Other | Source: Ambulatory Visit | Attending: Internal Medicine | Admitting: Internal Medicine

## 2015-09-07 ENCOUNTER — Encounter (HOSPITAL_COMMUNITY): Payer: Self-pay

## 2015-09-07 DIAGNOSIS — C3411 Malignant neoplasm of upper lobe, right bronchus or lung: Secondary | ICD-10-CM | POA: Diagnosis present

## 2015-09-07 DIAGNOSIS — M5136 Other intervertebral disc degeneration, lumbar region: Secondary | ICD-10-CM | POA: Diagnosis not present

## 2015-09-07 DIAGNOSIS — Z5112 Encounter for antineoplastic immunotherapy: Secondary | ICD-10-CM

## 2015-09-07 DIAGNOSIS — I2510 Atherosclerotic heart disease of native coronary artery without angina pectoris: Secondary | ICD-10-CM | POA: Insufficient documentation

## 2015-09-07 DIAGNOSIS — C7951 Secondary malignant neoplasm of bone: Secondary | ICD-10-CM | POA: Diagnosis present

## 2015-09-07 DIAGNOSIS — Z923 Personal history of irradiation: Secondary | ICD-10-CM | POA: Diagnosis not present

## 2015-09-07 DIAGNOSIS — N1330 Unspecified hydronephrosis: Secondary | ICD-10-CM | POA: Diagnosis not present

## 2015-09-07 DIAGNOSIS — N400 Enlarged prostate without lower urinary tract symptoms: Secondary | ICD-10-CM | POA: Diagnosis not present

## 2015-09-07 DIAGNOSIS — R918 Other nonspecific abnormal finding of lung field: Secondary | ICD-10-CM | POA: Diagnosis not present

## 2015-09-07 MED ORDER — DIATRIZOATE MEGLUMINE & SODIUM 66-10 % PO SOLN
30.0000 mL | Freq: Once | ORAL | Status: AC
  Administered 2015-09-07: 30 mL via ORAL
  Filled 2015-09-07: qty 30

## 2015-09-07 MED ORDER — IOPAMIDOL (ISOVUE-300) INJECTION 61%
100.0000 mL | Freq: Once | INTRAVENOUS | Status: AC | PRN
  Administered 2015-09-07: 100 mL via INTRAVENOUS

## 2015-09-10 ENCOUNTER — Ambulatory Visit (HOSPITAL_BASED_OUTPATIENT_CLINIC_OR_DEPARTMENT_OTHER): Payer: Medicare Other

## 2015-09-10 ENCOUNTER — Other Ambulatory Visit (HOSPITAL_BASED_OUTPATIENT_CLINIC_OR_DEPARTMENT_OTHER): Payer: Medicare Other

## 2015-09-10 ENCOUNTER — Telehealth: Payer: Self-pay | Admitting: Internal Medicine

## 2015-09-10 ENCOUNTER — Encounter: Payer: Self-pay | Admitting: Internal Medicine

## 2015-09-10 ENCOUNTER — Ambulatory Visit (HOSPITAL_BASED_OUTPATIENT_CLINIC_OR_DEPARTMENT_OTHER): Payer: Medicare Other | Admitting: Internal Medicine

## 2015-09-10 ENCOUNTER — Other Ambulatory Visit: Payer: Medicare Other

## 2015-09-10 VITALS — BP 143/87 | HR 94 | Temp 98.0°F | Resp 18 | Ht 66.5 in | Wt 166.1 lb

## 2015-09-10 DIAGNOSIS — C3411 Malignant neoplasm of upper lobe, right bronchus or lung: Secondary | ICD-10-CM

## 2015-09-10 DIAGNOSIS — Z5112 Encounter for antineoplastic immunotherapy: Secondary | ICD-10-CM | POA: Diagnosis not present

## 2015-09-10 DIAGNOSIS — C7951 Secondary malignant neoplasm of bone: Secondary | ICD-10-CM | POA: Diagnosis not present

## 2015-09-10 DIAGNOSIS — Z79899 Other long term (current) drug therapy: Secondary | ICD-10-CM | POA: Diagnosis not present

## 2015-09-10 DIAGNOSIS — C3490 Malignant neoplasm of unspecified part of unspecified bronchus or lung: Secondary | ICD-10-CM | POA: Diagnosis not present

## 2015-09-10 LAB — CBC WITH DIFFERENTIAL/PLATELET
BASO%: 0.2 % (ref 0.0–2.0)
Basophils Absolute: 0 10*3/uL (ref 0.0–0.1)
EOS%: 3.8 % (ref 0.0–7.0)
Eosinophils Absolute: 0.2 10*3/uL (ref 0.0–0.5)
HCT: 29.7 % — ABNORMAL LOW (ref 38.4–49.9)
HGB: 9.4 g/dL — ABNORMAL LOW (ref 13.0–17.1)
LYMPH#: 0.6 10*3/uL — AB (ref 0.9–3.3)
LYMPH%: 10.5 % — ABNORMAL LOW (ref 14.0–49.0)
MCH: 29.7 pg (ref 27.2–33.4)
MCHC: 31.6 g/dL — ABNORMAL LOW (ref 32.0–36.0)
MCV: 94 fL (ref 79.3–98.0)
MONO#: 0.4 10*3/uL (ref 0.1–0.9)
MONO%: 7 % (ref 0.0–14.0)
NEUT#: 4.5 10*3/uL (ref 1.5–6.5)
NEUT%: 78.5 % — AB (ref 39.0–75.0)
Platelets: 200 10*3/uL (ref 140–400)
RBC: 3.16 10*6/uL — AB (ref 4.20–5.82)
RDW: 17.9 % — AB (ref 11.0–14.6)
WBC: 5.7 10*3/uL (ref 4.0–10.3)

## 2015-09-10 LAB — COMPREHENSIVE METABOLIC PANEL
ALBUMIN: 3 g/dL — AB (ref 3.5–5.0)
ALK PHOS: 73 U/L (ref 40–150)
ALT: <9 U/L (ref 0–55)
ANION GAP: 9 meq/L (ref 3–11)
AST: 12 U/L (ref 5–34)
BILIRUBIN TOTAL: 0.33 mg/dL (ref 0.20–1.20)
BUN: 19.2 mg/dL (ref 7.0–26.0)
CO2: 23 mEq/L (ref 22–29)
CREATININE: 1.1 mg/dL (ref 0.7–1.3)
Calcium: 8.9 mg/dL (ref 8.4–10.4)
Chloride: 108 mEq/L (ref 98–109)
EGFR: 63 mL/min/{1.73_m2} — AB (ref >90)
Glucose: 82 mg/dl (ref 70–140)
Potassium: 3.8 mEq/L (ref 3.5–5.1)
Sodium: 140 mEq/L (ref 136–145)
TOTAL PROTEIN: 6.9 g/dL (ref 6.4–8.3)

## 2015-09-10 LAB — TSH: TSH: 1.805 m[IU]/L (ref 0.320–4.118)

## 2015-09-10 MED ORDER — SODIUM CHLORIDE 0.9 % IV SOLN
240.0000 mg | Freq: Once | INTRAVENOUS | Status: AC
  Administered 2015-09-10: 240 mg via INTRAVENOUS
  Filled 2015-09-10: qty 20

## 2015-09-10 MED ORDER — SODIUM CHLORIDE 0.9 % IV SOLN
Freq: Once | INTRAVENOUS | Status: AC
  Administered 2015-09-10: 13:00:00 via INTRAVENOUS

## 2015-09-10 NOTE — Addendum Note (Signed)
Addended by: Ardeen Garland on: 09/10/2015 01:42 PM   Modules accepted: Orders, Medications

## 2015-09-10 NOTE — Progress Notes (Signed)
.      [facility] [phone]   [phone]  OFFICE PROGRESS NOTE  [name], MD [place] [address]  DIAGNOSIS: Stage IIIB (T2a., N3, M0) non-small cell lung cancer consistent with invasive adenocarcinoma with negative ALK gene translocation and negative EGFR mutation diagnosed in October of 2013   PRIOR THERAPY:  1) Concurrent chemoradiation with weekly carboplatin for AUC of 2 and paclitaxel 45 mg/M2, last dose was given 06/11/2012 with partial response.  2) Consolidation chemotherapy with carboplatin for AUC of 5 and Alimta 500 mg/M2 every 3 weeks. Status post 3 cycles last dose was given 09/12/2012 with stable disease.  3) Systemic chemotherapy again with carboplatin for AUC of 5 and Alimta 500 mg/M2 every 3 weeks. First cycle expected on 11/13/2013. Status post 3 cycles, discontinued secondary to disease progression. Carboplatin was discontinued starting from cycle #2 secondary to hypersensitivity reaction. 4) Immunotherapy with single agent Nivolumab 3 MG/TG every 2 weeks status post 8 cycles.  5) Decompressive lumbar laminectomies from L4 to S1 for removal of metastatic tumor bilateral under the care of Dr. Saintclair Halsted on 05/27/2014. 6) status post palliative radiotherapy to the L4-S1 joint region of the lower spine for a total dose of 75 GYN he completed on 07/10/2014.  CURRENT THERAPY: Treatment with immunotherapy again with single agent Nivolumab 240 MG IV every 2 weeks. First dose 06/25/2015. Status post 4 cycles  CHEMOTHERAPY INTENT: Palliative  CURRENT # OF CHEMOTHERAPY CYCLES: 5  CURRENT ANTIEMETICS: N/A  CURRENT SMOKING STATUS: Former Smoker  ORAL CHEMOTHERAPY AND CONSENT: None  CURRENT BISPHOSPHONATES USE: None  PAIN MANAGEMENT: 0/10  NARCOTICS INDUCED CONSTIPATION: None  LIVING WILL AND CODE STATUS: no code Blue  INTERVAL HISTORY: Logan Russell 81 y.o. male returns to the clinic today for  follow-up visit accompanied by his wife. He is currently on treatment with immunotherapy with Nivolumab status post 4 cycles and tolerating his treatment fairly well except for the persistent fatigue and weakness in the lower extremity after his previous spine surgery. He denied having any nausea or vomiting, no fever or chills, no diarrhea or skin rash. He denied having any significant chest pain, shortness of breath, cough or hemoptysis. He had repeat CT scan of the chest, abdomen and pelvis performed recently and he is here today for evaluation and discussion of his scan results.  MEDICAL HISTORY: Past Medical History  Diagnosis Date  . Hypertension   . History of radiation therapy 05/02/12-06/19/12    left lung mediastinal lymph nodes 66Gy/37f  . [place]   . [place] [place]  . [place]     non small cell lung ca with mets to spine  . Encounter for antineoplastic immunotherapy 06/19/2015    ALLERGIES:  has No Known Allergies.  MEDICATIONS:  Current Outpatient Prescriptions  Medication Sig Dispense Refill  . acetaminophen (TYLENOL) 325 MG tablet Take 2 tablets (650 mg total) by mouth every 4 (four) hours as needed for mild pain or fever (temp > 100.5). (Patient not taking: Reported on 08/27/2015)    . bisacodyl (DULCOLAX) 10 MG suppository Place 1 suppository (10 mg total) rectally daily as needed for severe constipation. (Patient not taking: Reported on 08/27/2015)    . furosemide (LASIX) 40 MG tablet Take 40 mg by mouth daily as needed.    .Marland KitchenguaiFENesin (MUCINEX) 600 MG 12 hr tablet Take 2 tablets (1,200 mg total) by mouth 2 (two) times daily as needed  for cough or to loosen phlegm.    . Oxycodone HCl 10 MG TABS Take 10 mg by mouth every 4 (four) hours as needed. for pain  0  . polyethylene glycol (MIRALAX / GLYCOLAX) packet Take 17 g by mouth daily as needed for moderate constipation.    . potassium chloride SA (K-DUR,KLOR-CON) 20 MEQ tablet Take 1  tablet (20 mEq total) by mouth 2 (two) times daily. 14 tablet 0  . promethazine (PHENERGAN) 25 MG tablet Take 25 mg by mouth every 6 (six) hours as needed for nausea or vomiting. Reported on 08/27/2015    . senna (SENOKOT) 8.6 MG TABS tablet Take 1 tablet (8.6 mg total) by mouth daily as needed for mild constipation. (Patient not taking: Reported on 08/27/2015)     No current facility-administered medications for this visit.    REVIEW OF SYSTEMS:  Constitutional: positive for fatigue Eyes: negative Ears, nose, mouth, throat, and face: negative Respiratory: negative Cardiovascular: negative Gastrointestinal: negative Genitourinary:negative Integument/breast: negative Hematologic/lymphatic: negative Musculoskeletal:positive for muscle weakness Neurological: negative Behavioral/Psych: negative Endocrine: negative Allergic/Immunologic: negative   PHYSICAL EXAMINATION: General appearance: alert, cooperative, fatigued and no distress Head: Normocephalic, without obvious abnormality, atraumatic Neck: no adenopathy Lymph nodes: Cervical, supraclavicular, and axillary nodes normal. Resp: clear to auscultation bilaterally Back: symmetric, no curvature. ROM normal. No CVA tenderness. Cardio: regular rate and rhythm, S1, S2 normal, no murmur, click, rub or gallop GI: soft, non-tender; bowel sounds normal; no masses,  no organomegaly Extremities: extremities normal, atraumatic, no cyanosis or edema Neurologic: Alert and oriented X 3, normal strength and tone. Normal symmetric reflexes. Normal coordination and gait  ECOG PERFORMANCE STATUS: 2 - Symptomatic, <50% confined to bed  Blood pressure 143/87, pulse 94, temperature 98 F (36.7 C), temperature source Oral, resp. rate 18, height 5' 6.5" (1.689 m), weight 166 lb 1.6 oz (75.342 kg), SpO2 100 %.  LABORATORY DATA: Lab Results  Component Value Date   WBC 5.7 09/10/2015   HGB 9.4* 09/10/2015   HCT 29.7* 09/10/2015   MCV 94.0 09/10/2015    PLT 200 09/10/2015      Chemistry      Component Value Date/Time   NA 139 08/27/2015 1121   NA 134* 06/03/2014 0356   K 3.5 08/27/2015 1121   K 3.6 06/03/2014 0356   CL 101 06/03/2014 0356   CL 106 10/08/2012 1028   CO2 27 08/27/2015 1121   CO2 27 06/03/2014 0356   BUN 20.8 08/27/2015 1121   BUN 29* 06/03/2014 0356   CREATININE 1.2 08/27/2015 1121   CREATININE 1.66* 06/03/2014 0356      Component Value Date/Time   CALCIUM 8.9 08/27/2015 1121   CALCIUM 8.4 06/03/2014 0356   ALKPHOS 68 08/27/2015 1121   ALKPHOS 66 05/28/2014 0805   AST 14 08/27/2015 1121   AST 17 05/28/2014 0805   ALT 9 08/27/2015 1121   ALT 15 05/28/2014 0805   BILITOT 0.34 08/27/2015 1121   BILITOT 0.5 05/28/2014 0805       RADIOGRAPHIC STUDIES: Ct Chest W Contrast  09/07/2015  CLINICAL DATA:  Metastatic lung cancer. Status post chemo and XRT. Ongoing immunotherapy. EXAM: CT CHEST, ABDOMEN, AND PELVIS WITH CONTRAST TECHNIQUE: Multidetector CT imaging of the chest, abdomen and pelvis was performed following the standard protocol during bolus administration of intravenous contrast. CONTRAST:  134m ISOVUE-300 IOPAMIDOL (ISOVUE-300) INJECTION 61% COMPARISON:  None. FINDINGS: CT CHEST FINDINGS Mediastinum/Lymph Nodes: The index low right jugular node measures 5 mm, image 5 of series 2. Previously  this measured 6 mm. The index right paratracheal node measures 7 mm, image 13 of series 2. Previously this measured the same index low right paratracheal lymph node measures 11 mm, image 24 of series 2. Previously 1.2 cm. Lymph node posterior to the esophagus and adjacent to the aorta measures 7 mm, image 27 of series 2. Unchanged from previous exam. Lungs/Pleura: Moderate changes of centrilobular and paraseptal emphysema noted. There is paramediastinal radiation change identified within the right midlung. The index subpleural nodule in the left lower lobe is stable measuring 11 mm, image 77 of series 4. Index nodule in the  right lower lobe measures 5 mm, image 75 of series 4. Also unchanged. Index right lower lobe pulmonary nodule is unchanged measuring 7 mm, image 63 of series 4. The the right midlung nodule with surrounding ground-glass attenuation measures 8 mm, image 46 of series 4. Stable from previous exam. Musculoskeletal: No chest wall mass or suspicious bone lesions identified. CT ABDOMEN PELVIS FINDINGS Hepatobiliary: There is no focal scratch set several low-attenuation foci are identified within the liver. These are too small to characterize but appear unchanged from previous exam. Normal appearance of the gallbladder. No biliary dilatation. Pancreas: No mass, inflammatory changes, or other significant abnormality. Spleen: Within normal limits in size and appearance. Adrenals/Urinary Tract: There is bilateral hydronephrosis and mild hydroureter. A Foley catheter is identified. The balloon appears inflated within the prostatic portion of the urethra. The urinary bladder appears partially distended. Stomach/Bowel: The stomach is normal. No pathologic dilatation of the large or small bowel loops. Vascular/Lymphatic: Calcified atherosclerotic disease involves the abdominal aorta. No aneurysm. Index retrocaval lymph node measures 1.8 cm, image 70 of series 2. Previously 2.4 cm. Index mesenteric node measures 1.5 cm, image 66 of series 2. Previously 1.2 cm. There is an index periaortic node measuring 1.2 cm, image 72 of series 2. Unchanged from previous exam. Aortocaval lymph node measures 2.6 cm, image number 70 of series 2. Previously 2.8 cm. No enlarged iliac or inguinal lymph nodes identified. Reproductive: Prostate gland enlargement is identified. The Foley catheter balloon appears inflated within the prostatic urethra. The tip of the catheter does not appear to reach the bladder lumen, image 95 of series 603. This may need to be readjusted. Other: No free fluid or fluid collections within the abdomen or pelvis.  Musculoskeletal: No suspicious bone lesions identified. There is degenerative disc disease identified throughout the lumbar spine. Most advanced at the L5-S1 level. IMPRESSION: 1. No significant interval change in thoracic and abdominal metastatic adenopathy. 2. No significant interval change in multifocal pulmonary metastasis. Electronically Signed   By: Kerby Moors M.D.   On: 09/07/2015 14:47   Ct Abdomen Pelvis W Contrast  09/07/2015  CLINICAL DATA:  Metastatic lung cancer. Status post chemo and XRT. Ongoing immunotherapy. EXAM: CT CHEST, ABDOMEN, AND PELVIS WITH CONTRAST TECHNIQUE: Multidetector CT imaging of the chest, abdomen and pelvis was performed following the standard protocol during bolus administration of intravenous contrast. CONTRAST:  175m ISOVUE-300 IOPAMIDOL (ISOVUE-300) INJECTION 61% COMPARISON:  None. FINDINGS: CT CHEST FINDINGS Mediastinum/Lymph Nodes: The index low right jugular node measures 5 mm, image 5 of series 2. Previously this measured 6 mm. The index right paratracheal node measures 7 mm, image 13 of series 2. Previously this measured the same index low right paratracheal lymph node measures 11 mm, image 24 of series 2. Previously 1.2 cm. Lymph node posterior to the esophagus and adjacent to the aorta measures 7 mm, image 27 of series 2.  Unchanged from previous exam. Lungs/Pleura: Moderate changes of centrilobular and paraseptal emphysema noted. There is paramediastinal radiation change identified within the right midlung. The index subpleural nodule in the left lower lobe is stable measuring 11 mm, image 77 of series 4. Index nodule in the right lower lobe measures 5 mm, image 75 of series 4. Also unchanged. Index right lower lobe pulmonary nodule is unchanged measuring 7 mm, image 63 of series 4. The the right midlung nodule with surrounding ground-glass attenuation measures 8 mm, image 46 of series 4. Stable from previous exam. Musculoskeletal: No chest wall mass or  suspicious bone lesions identified. CT ABDOMEN PELVIS FINDINGS Hepatobiliary: There is no focal scratch set several low-attenuation foci are identified within the liver. These are too small to characterize but appear unchanged from previous exam. Normal appearance of the gallbladder. No biliary dilatation. Pancreas: No mass, inflammatory changes, or other significant abnormality. Spleen: Within normal limits in size and appearance. Adrenals/Urinary Tract: There is bilateral hydronephrosis and mild hydroureter. A Foley catheter is identified. The balloon appears inflated within the prostatic portion of the urethra. The urinary bladder appears partially distended. Stomach/Bowel: The stomach is normal. No pathologic dilatation of the large or small bowel loops. Vascular/Lymphatic: Calcified atherosclerotic disease involves the abdominal aorta. No aneurysm. Index retrocaval lymph node measures 1.8 cm, image 70 of series 2. Previously 2.4 cm. Index mesenteric node measures 1.5 cm, image 66 of series 2. Previously 1.2 cm. There is an index periaortic node measuring 1.2 cm, image 72 of series 2. Unchanged from previous exam. Aortocaval lymph node measures 2.6 cm, image number 70 of series 2. Previously 2.8 cm. No enlarged iliac or inguinal lymph nodes identified. Reproductive: Prostate gland enlargement is identified. The Foley catheter balloon appears inflated within the prostatic urethra. The tip of the catheter does not appear to reach the bladder lumen, image 95 of series 603. This may need to be readjusted. Other: No free fluid or fluid collections within the abdomen or pelvis. Musculoskeletal: No suspicious bone lesions identified. There is degenerative disc disease identified throughout the lumbar spine. Most advanced at the L5-S1 level. IMPRESSION: 1. No significant interval change in thoracic and abdominal metastatic adenopathy. 2. No significant interval change in multifocal pulmonary metastasis. Electronically  Signed   By: Kerby Moors M.D.   On: 09/07/2015 14:47   ASSESSMENT AND PLAN: this is a very pleasant 81 years old white male with metastatic non-small cell lung cancer initially diagnosed as stage IIIB non-small cell lung cancer status post concurrent chemoradiation followed by consolidation chemotherapy.  He had evidence for disease recurrence and the patient was started on systemic chemotherapy with carboplatin and Alimta discontinued secondary to disease progression. He is currently undergoing immunotherapy with single agent Nivolumab status post 8 cycles. This was discontinued after the patient had progressive disease in the lumbar spines and require surgical resection followed by palliative radiotherapy. The recent CT scan of the chest, abdomen and pelvis showed new and increased pulmonary nodularity, suspicious for progressive metastatic disease. There was also enlargement of thoracic nodes, including a low left mediastinal node and progression of bulky abdominal lymphadenopathy. The patient was started on treatment with immunotherapy with Nivolumab status post 4 cycles. He is tolerating the treatment well except for the persistent fatigue. Restaging CT scan of the chest, abdomen and pelvis showed no significant change in the thoracic and abdominal metastatic adenopathy and no evidence for disease progression. I discussed the scan results with the patient and his wife. I recommended for  him to continue his treatment with immunotherapy. The patient will proceed with cycle #5 today as a scheduled. I will see him back for follow-up visit in 2 weeks for reevaluation before starting cycle #6. He was advised to call immediately if he has any concerning symptoms in the interval. The patient voices understanding of current disease status and treatment options and is in agreement with the current care plan. All questions were answered. The patient knows to call the clinic with any problems, questions or  concerns. We can certainly see the patient much sooner if necessary.  Disclaimer: This note was dictated with voice recognition software. Similar sounding words can inadvertently be transcribed and may not be corrected upon review.

## 2015-09-10 NOTE — Telephone Encounter (Signed)
Added on appt for may .Marland Kitchen Pt will get documents in tx

## 2015-09-10 NOTE — Patient Instructions (Signed)
[facility] Discharge Instructions for Patients Receiving Chemotherapy  Today you received the following chemotherapy agents:  Nivolumab.  To help prevent nausea and vomiting after your treatment, we encourage you to take your nausea medication as directed.   If you develop nausea and vomiting that is not controlled by your nausea medication, call the clinic.   BELOW ARE SYMPTOMS THAT SHOULD BE REPORTED IMMEDIATELY:  *FEVER GREATER THAN 100.5 F  *CHILLS WITH OR WITHOUT FEVER  NAUSEA AND VOMITING THAT IS NOT CONTROLLED WITH YOUR NAUSEA MEDICATION  *UNUSUAL SHORTNESS OF BREATH  *UNUSUAL BRUISING OR BLEEDING  TENDERNESS IN MOUTH AND THROAT WITH OR WITHOUT PRESENCE OF ULCERS  *URINARY PROBLEMS  *BOWEL PROBLEMS  UNUSUAL RASH Items with * indicate a potential emergency and should be followed up as soon as possible.  Feel free to call the clinic you have any questions or concerns. The clinic phone number is [phone].  Please show the CHEMO ALERT CARD at check-in to the Emergency Department and triage nurse.   

## 2015-09-24 ENCOUNTER — Other Ambulatory Visit (HOSPITAL_BASED_OUTPATIENT_CLINIC_OR_DEPARTMENT_OTHER): Payer: Medicare Other

## 2015-09-24 ENCOUNTER — Ambulatory Visit (HOSPITAL_BASED_OUTPATIENT_CLINIC_OR_DEPARTMENT_OTHER): Payer: Medicare Other

## 2015-09-24 ENCOUNTER — Ambulatory Visit (HOSPITAL_BASED_OUTPATIENT_CLINIC_OR_DEPARTMENT_OTHER): Payer: Medicare Other | Admitting: Internal Medicine

## 2015-09-24 ENCOUNTER — Encounter: Payer: Self-pay | Admitting: Internal Medicine

## 2015-09-24 VITALS — BP 145/81 | HR 92 | Temp 98.0°F | Resp 18 | Ht 66.5 in | Wt 164.6 lb

## 2015-09-24 DIAGNOSIS — Z5112 Encounter for antineoplastic immunotherapy: Secondary | ICD-10-CM

## 2015-09-24 DIAGNOSIS — C3490 Malignant neoplasm of unspecified part of unspecified bronchus or lung: Secondary | ICD-10-CM

## 2015-09-24 DIAGNOSIS — C3411 Malignant neoplasm of upper lobe, right bronchus or lung: Secondary | ICD-10-CM

## 2015-09-24 LAB — COMPREHENSIVE METABOLIC PANEL
ALBUMIN: 3.2 g/dL — AB (ref 3.5–5.0)
ALT: 9 U/L (ref 0–55)
AST: 13 U/L (ref 5–34)
Alkaline Phosphatase: 73 U/L (ref 40–150)
Anion Gap: 8 mEq/L (ref 3–11)
BUN: 26.7 mg/dL — AB (ref 7.0–26.0)
CHLORIDE: 109 meq/L (ref 98–109)
CO2: 23 meq/L (ref 22–29)
Calcium: 8.9 mg/dL (ref 8.4–10.4)
Creatinine: 1.1 mg/dL (ref 0.7–1.3)
EGFR: 61 mL/min/{1.73_m2} — AB (ref >90)
GLUCOSE: 84 mg/dL (ref 70–140)
POTASSIUM: 3.8 meq/L (ref 3.5–5.1)
Sodium: 140 mEq/L (ref 136–145)
Total Bilirubin: 0.4 mg/dL (ref 0.20–1.20)
Total Protein: 6.8 g/dL (ref 6.4–8.3)

## 2015-09-24 LAB — CBC WITH DIFFERENTIAL/PLATELET
BASO%: 0.3 % (ref 0.0–2.0)
BASOS ABS: 0 10*3/uL (ref 0.0–0.1)
EOS ABS: 0.4 10*3/uL (ref 0.0–0.5)
EOS%: 5.5 % (ref 0.0–7.0)
HEMATOCRIT: 32.9 % — AB (ref 38.4–49.9)
HGB: 10.5 g/dL — ABNORMAL LOW (ref 13.0–17.1)
LYMPH#: 0.9 10*3/uL (ref 0.9–3.3)
LYMPH%: 13.1 % — AB (ref 14.0–49.0)
MCH: 30.2 pg (ref 27.2–33.4)
MCHC: 31.9 g/dL — AB (ref 32.0–36.0)
MCV: 94.5 fL (ref 79.3–98.0)
MONO#: 0.4 10*3/uL (ref 0.1–0.9)
MONO%: 6.5 % (ref 0.0–14.0)
NEUT#: 5 10*3/uL (ref 1.5–6.5)
NEUT%: 74.6 % (ref 39.0–75.0)
PLATELETS: 193 10*3/uL (ref 140–400)
RBC: 3.48 10*6/uL — AB (ref 4.20–5.82)
RDW: 17.5 % — ABNORMAL HIGH (ref 11.0–14.6)
WBC: 6.7 10*3/uL (ref 4.0–10.3)

## 2015-09-24 MED ORDER — SODIUM CHLORIDE 0.9 % IV SOLN
Freq: Once | INTRAVENOUS | Status: AC
  Administered 2015-09-24: 12:00:00 via INTRAVENOUS

## 2015-09-24 MED ORDER — NIVOLUMAB CHEMO INJECTION 100 MG/10ML
240.0000 mg | Freq: Once | INTRAVENOUS | Status: AC
  Administered 2015-09-24: 240 mg via INTRAVENOUS
  Filled 2015-09-24: qty 20

## 2015-09-24 NOTE — Patient Instructions (Signed)
[place] [facility] Discharge Instructions for Patients Receiving Chemotherapy  Today you received the following chemotherapy agents Nivolumab  To help prevent nausea and vomiting after your treatment, we encourage you to take your nausea medications   If you develop nausea and vomiting that is not controlled by your nausea medication, call the clinic.   BELOW ARE SYMPTOMS THAT SHOULD BE REPORTED IMMEDIATELY:  *FEVER GREATER THAN 100.5 F  *CHILLS WITH OR WITHOUT FEVER  NAUSEA AND VOMITING THAT IS NOT CONTROLLED WITH YOUR NAUSEA MEDICATION  *UNUSUAL SHORTNESS OF BREATH  *UNUSUAL BRUISING OR BLEEDING  TENDERNESS IN MOUTH AND THROAT WITH OR WITHOUT PRESENCE OF ULCERS  *URINARY PROBLEMS  *BOWEL PROBLEMS  UNUSUAL RASH Items with * indicate a potential emergency and should be followed up as soon as possible.  Feel free to call the clinic you have any questions or concerns. The clinic phone number is [phone].  Please show the [place] at check-in to the Emergency Department and triage nurse.

## 2015-09-24 NOTE — Progress Notes (Signed)
.      [place] [phone]   [phone]  OFFICE PROGRESS NOTE  [name], MD [place] [address]  DIAGNOSIS: Stage IIIB (T2a., N3, M0) non-small cell lung cancer consistent with invasive adenocarcinoma with negative ALK gene translocation and negative EGFR mutation diagnosed in October of 2013   PRIOR THERAPY:  1) Concurrent chemoradiation with weekly carboplatin for AUC of 2 and paclitaxel 45 mg/M2, last dose was given 06/11/2012 with partial response.  2) Consolidation chemotherapy with carboplatin for AUC of 5 and Alimta 500 mg/M2 every 3 weeks. Status post 3 cycles last dose was given 09/12/2012 with stable disease.  3) Systemic chemotherapy again with carboplatin for AUC of 5 and Alimta 500 mg/M2 every 3 weeks. First cycle expected on 11/13/2013. Status post 3 cycles, discontinued secondary to disease progression. Carboplatin was discontinued starting from cycle #2 secondary to hypersensitivity reaction. 4) Immunotherapy with single agent Nivolumab 3 MG/TG every 2 weeks status post 8 cycles.  5) Decompressive lumbar laminectomies from L4 to S1 for removal of metastatic tumor bilateral under the care of Dr. Saintclair Halsted on 05/27/2014. 6) status post palliative radiotherapy to the L4-S1 joint region of the lower spine for a total dose of 75 GYN he completed on 07/10/2014.  CURRENT THERAPY: Treatment with immunotherapy again with single agent Nivolumab 240 MG IV every 2 weeks. First dose 06/25/2015. Status post 5 cycles  CHEMOTHERAPY INTENT: Palliative  CURRENT # OF CHEMOTHERAPY CYCLES: 6  CURRENT ANTIEMETICS: N/A  CURRENT SMOKING STATUS: Former Smoker  ORAL CHEMOTHERAPY AND CONSENT: None  CURRENT BISPHOSPHONATES USE: None  PAIN MANAGEMENT: 0/10  NARCOTICS INDUCED CONSTIPATION: None  LIVING WILL AND CODE STATUS: no code Blue  INTERVAL HISTORY: Logan Russell 81 y.o. male returns to the clinic today for  follow-up visit accompanied by his wife. He is currently on treatment with immunotherapy with Nivolumab status post 5 cycles and tolerating his treatment fairly well except for weakness in the lower extremity after his previous spine surgery. He was able to mowe 5 acre of grass last week on his riding mower. He denied having any nausea or vomiting, no fever or chills, no diarrhea or skin rash. He denied having any significant chest pain, shortness of breath, cough or hemoptysis. He is here today to start cycle #6 of his treatment.  MEDICAL HISTORY: Past Medical History  Diagnosis Date  . Hypertension   . History of radiation therapy 05/02/12-06/19/12    left lung mediastinal lymph nodes 66Gy/55f  . [place] [place]   . [place]  . [place] ([facility]     non small cell lung ca with mets to spine  . Encounter for antineoplastic immunotherapy 06/19/2015    ALLERGIES:  has No Known Allergies.  MEDICATIONS:  Current Outpatient Prescriptions  Medication Sig Dispense Refill  . acetaminophen (TYLENOL) 325 MG tablet Take 2 tablets (650 mg total) by mouth every 4 (four) hours as needed for mild pain or fever (temp > 100.5).    . bisacodyl (DULCOLAX) 10 MG suppository Place 1 suppository (10 mg total) rectally daily as needed for severe constipation.    . furosemide (LASIX) 40 MG tablet Take 40 mg by mouth daily as needed.    .Marland KitchenguaiFENesin (MUCINEX) 600 MG 12 hr tablet Take 2 tablets (1,200 mg total) by mouth 2 (two) times daily as needed for cough or to loosen phlegm.    . senna (SENOKOT) 8.6 MG TABS tablet  Take 1 tablet (8.6 mg total) by mouth daily as needed for mild constipation.    . Oxycodone HCl 10 MG TABS Take 10 mg by mouth every 4 (four) hours as needed. Reported on 09/24/2015  0  . polyethylene glycol (MIRALAX / GLYCOLAX) packet Take 17 g by mouth daily as needed for moderate constipation. (Patient not taking: Reported on 09/24/2015)    . promethazine (PHENERGAN)  25 MG tablet Take 25 mg by mouth every 6 (six) hours as needed for nausea or vomiting. Reported on 09/24/2015     No current facility-administered medications for this visit.    REVIEW OF SYSTEMS:  A comprehensive review of systems was negative except for: Constitutional: positive for fatigue Musculoskeletal: positive for muscle weakness   PHYSICAL EXAMINATION: General appearance: alert, cooperative, fatigued and no distress Head: Normocephalic, without obvious abnormality, atraumatic Neck: no adenopathy Lymph nodes: Cervical, supraclavicular, and axillary nodes normal. Resp: clear to auscultation bilaterally Back: symmetric, no curvature. ROM normal. No CVA tenderness. Cardio: regular rate and rhythm, S1, S2 normal, no murmur, click, rub or gallop GI: soft, non-tender; bowel sounds normal; no masses,  no organomegaly Extremities: extremities normal, atraumatic, no cyanosis or edema Neurologic: Alert and oriented X 3, normal strength and tone. Normal symmetric reflexes. Normal coordination and gait  ECOG PERFORMANCE STATUS: 2 - Symptomatic, <50% confined to bed  Blood pressure 145/81, pulse 92, temperature 98 F (36.7 C), temperature source Oral, resp. rate 18, height 5' 6.5" (1.689 m), weight 164 lb 9.6 oz (74.662 kg), SpO2 100 %.  LABORATORY DATA: Lab Results  Component Value Date   WBC 6.7 09/24/2015   HGB 10.5* 09/24/2015   HCT 32.9* 09/24/2015   MCV 94.5 09/24/2015   PLT 193 09/24/2015      Chemistry      Component Value Date/Time   NA 140 09/10/2015 1055   NA 134* 06/03/2014 0356   K 3.8 09/10/2015 1055   K 3.6 06/03/2014 0356   CL 101 06/03/2014 0356   CL 106 10/08/2012 1028   CO2 23 09/10/2015 1055   CO2 27 06/03/2014 0356   BUN 19.2 09/10/2015 1055   BUN 29* 06/03/2014 0356   CREATININE 1.1 09/10/2015 1055   CREATININE 1.66* 06/03/2014 0356      Component Value Date/Time   CALCIUM 8.9 09/10/2015 1055   CALCIUM 8.4 06/03/2014 0356   ALKPHOS 73 09/10/2015  1055   ALKPHOS 66 05/28/2014 0805   AST 12 09/10/2015 1055   AST 17 05/28/2014 0805   ALT <9 09/10/2015 1055   ALT 15 05/28/2014 0805   BILITOT 0.33 09/10/2015 1055   BILITOT 0.5 05/28/2014 0805       RADIOGRAPHIC STUDIES: Ct Chest W Contrast  09/07/2015  CLINICAL DATA:  Metastatic lung cancer. Status post chemo and XRT. Ongoing immunotherapy. EXAM: CT CHEST, ABDOMEN, AND PELVIS WITH CONTRAST TECHNIQUE: Multidetector CT imaging of the chest, abdomen and pelvis was performed following the standard protocol during bolus administration of intravenous contrast. CONTRAST:  155m ISOVUE-300 IOPAMIDOL (ISOVUE-300) INJECTION 61% COMPARISON:  None. FINDINGS: CT CHEST FINDINGS Mediastinum/Lymph Nodes: The index low right jugular node measures 5 mm, image 5 of series 2. Previously this measured 6 mm. The index right paratracheal node measures 7 mm, image 13 of series 2. Previously this measured the same index low right paratracheal lymph node measures 11 mm, image 24 of series 2. Previously 1.2 cm. Lymph node posterior to the esophagus and adjacent to the aorta measures 7 mm, image 27 of series  2. Unchanged from previous exam. Lungs/Pleura: Moderate changes of centrilobular and paraseptal emphysema noted. There is paramediastinal radiation change identified within the right midlung. The index subpleural nodule in the left lower lobe is stable measuring 11 mm, image 77 of series 4. Index nodule in the right lower lobe measures 5 mm, image 75 of series 4. Also unchanged. Index right lower lobe pulmonary nodule is unchanged measuring 7 mm, image 63 of series 4. The the right midlung nodule with surrounding ground-glass attenuation measures 8 mm, image 46 of series 4. Stable from previous exam. Musculoskeletal: No chest wall mass or suspicious bone lesions identified. CT ABDOMEN PELVIS FINDINGS Hepatobiliary: There is no focal scratch set several low-attenuation foci are identified within the liver. These are too  small to characterize but appear unchanged from previous exam. Normal appearance of the gallbladder. No biliary dilatation. Pancreas: No mass, inflammatory changes, or other significant abnormality. Spleen: Within normal limits in size and appearance. Adrenals/Urinary Tract: There is bilateral hydronephrosis and mild hydroureter. A Foley catheter is identified. The balloon appears inflated within the prostatic portion of the urethra. The urinary bladder appears partially distended. Stomach/Bowel: The stomach is normal. No pathologic dilatation of the large or small bowel loops. Vascular/Lymphatic: Calcified atherosclerotic disease involves the abdominal aorta. No aneurysm. Index retrocaval lymph node measures 1.8 cm, image 70 of series 2. Previously 2.4 cm. Index mesenteric node measures 1.5 cm, image 66 of series 2. Previously 1.2 cm. There is an index periaortic node measuring 1.2 cm, image 72 of series 2. Unchanged from previous exam. Aortocaval lymph node measures 2.6 cm, image number 70 of series 2. Previously 2.8 cm. No enlarged iliac or inguinal lymph nodes identified. Reproductive: Prostate gland enlargement is identified. The Foley catheter balloon appears inflated within the prostatic urethra. The tip of the catheter does not appear to reach the bladder lumen, image 95 of series 603. This may need to be readjusted. Other: No free fluid or fluid collections within the abdomen or pelvis. Musculoskeletal: No suspicious bone lesions identified. There is degenerative disc disease identified throughout the lumbar spine. Most advanced at the L5-S1 level. IMPRESSION: 1. No significant interval change in thoracic and abdominal metastatic adenopathy. 2. No significant interval change in multifocal pulmonary metastasis. Electronically Signed   By: Kerby Moors M.D.   On: 09/07/2015 14:47   Ct Abdomen Pelvis W Contrast  09/07/2015  CLINICAL DATA:  Metastatic lung cancer. Status post chemo and XRT. Ongoing  immunotherapy. EXAM: CT CHEST, ABDOMEN, AND PELVIS WITH CONTRAST TECHNIQUE: Multidetector CT imaging of the chest, abdomen and pelvis was performed following the standard protocol during bolus administration of intravenous contrast. CONTRAST:  118m ISOVUE-300 IOPAMIDOL (ISOVUE-300) INJECTION 61% COMPARISON:  None. FINDINGS: CT CHEST FINDINGS Mediastinum/Lymph Nodes: The index low right jugular node measures 5 mm, image 5 of series 2. Previously this measured 6 mm. The index right paratracheal node measures 7 mm, image 13 of series 2. Previously this measured the same index low right paratracheal lymph node measures 11 mm, image 24 of series 2. Previously 1.2 cm. Lymph node posterior to the esophagus and adjacent to the aorta measures 7 mm, image 27 of series 2. Unchanged from previous exam. Lungs/Pleura: Moderate changes of centrilobular and paraseptal emphysema noted. There is paramediastinal radiation change identified within the right midlung. The index subpleural nodule in the left lower lobe is stable measuring 11 mm, image 77 of series 4. Index nodule in the right lower lobe measures 5 mm, image 75 of series 4.  Also unchanged. Index right lower lobe pulmonary nodule is unchanged measuring 7 mm, image 63 of series 4. The the right midlung nodule with surrounding ground-glass attenuation measures 8 mm, image 46 of series 4. Stable from previous exam. Musculoskeletal: No chest wall mass or suspicious bone lesions identified. CT ABDOMEN PELVIS FINDINGS Hepatobiliary: There is no focal scratch set several low-attenuation foci are identified within the liver. These are too small to characterize but appear unchanged from previous exam. Normal appearance of the gallbladder. No biliary dilatation. Pancreas: No mass, inflammatory changes, or other significant abnormality. Spleen: Within normal limits in size and appearance. Adrenals/Urinary Tract: There is bilateral hydronephrosis and mild hydroureter. A Foley catheter  is identified. The balloon appears inflated within the prostatic portion of the urethra. The urinary bladder appears partially distended. Stomach/Bowel: The stomach is normal. No pathologic dilatation of the large or small bowel loops. Vascular/Lymphatic: Calcified atherosclerotic disease involves the abdominal aorta. No aneurysm. Index retrocaval lymph node measures 1.8 cm, image 70 of series 2. Previously 2.4 cm. Index mesenteric node measures 1.5 cm, image 66 of series 2. Previously 1.2 cm. There is an index periaortic node measuring 1.2 cm, image 72 of series 2. Unchanged from previous exam. Aortocaval lymph node measures 2.6 cm, image number 70 of series 2. Previously 2.8 cm. No enlarged iliac or inguinal lymph nodes identified. Reproductive: Prostate gland enlargement is identified. The Foley catheter balloon appears inflated within the prostatic urethra. The tip of the catheter does not appear to reach the bladder lumen, image 95 of series 603. This may need to be readjusted. Other: No free fluid or fluid collections within the abdomen or pelvis. Musculoskeletal: No suspicious bone lesions identified. There is degenerative disc disease identified throughout the lumbar spine. Most advanced at the L5-S1 level. IMPRESSION: 1. No significant interval change in thoracic and abdominal metastatic adenopathy. 2. No significant interval change in multifocal pulmonary metastasis. Electronically Signed   By: Kerby Moors M.D.   On: 09/07/2015 14:47   ASSESSMENT AND PLAN: this is a very pleasant 81 years old white male with metastatic non-small cell lung cancer initially diagnosed as stage IIIB non-small cell lung cancer status post concurrent chemoradiation followed by consolidation chemotherapy.  He had evidence for disease recurrence and the patient was started on systemic chemotherapy with carboplatin and Alimta discontinued secondary to disease progression. He is currently undergoing immunotherapy with single  agent Nivolumab status post 8 cycles. This was discontinued after the patient had progressive disease in the lumbar spines and require surgical resection followed by palliative radiotherapy. The recent CT scan of the chest, abdomen and pelvis showed new and increased pulmonary nodularity, suspicious for progressive metastatic disease. There was also enlargement of thoracic nodes, including a low left mediastinal node and progression of bulky abdominal lymphadenopathy. The patient was started on treatment with immunotherapy with Nivolumab status post 5 cycles. He is tolerating the treatment well except for the persistent fatigue. I recommended for the patient to continue his treatment with immunotherapy. The patient will proceed with cycle #6 today as a scheduled. I will see him back for follow-up visit in 2 weeks for reevaluation before starting cycle #7. He was advised to call immediately if he has any concerning symptoms in the interval. The patient voices understanding of current disease status and treatment options and is in agreement with the current care plan. All questions were answered. The patient knows to call the clinic with any problems, questions or concerns. We can certainly see the patient  much sooner if necessary.  Disclaimer: This note was dictated with voice recognition software. Similar sounding words can inadvertently be transcribed and may not be corrected upon review.

## 2015-10-08 ENCOUNTER — Ambulatory Visit (HOSPITAL_BASED_OUTPATIENT_CLINIC_OR_DEPARTMENT_OTHER): Payer: Medicare Other

## 2015-10-08 ENCOUNTER — Ambulatory Visit (HOSPITAL_BASED_OUTPATIENT_CLINIC_OR_DEPARTMENT_OTHER): Payer: Medicare Other | Admitting: Nurse Practitioner

## 2015-10-08 ENCOUNTER — Telehealth: Payer: Self-pay | Admitting: Internal Medicine

## 2015-10-08 ENCOUNTER — Other Ambulatory Visit (HOSPITAL_BASED_OUTPATIENT_CLINIC_OR_DEPARTMENT_OTHER): Payer: Medicare Other

## 2015-10-08 VITALS — BP 141/81 | HR 93 | Temp 98.1°F | Resp 17 | Ht 66.5 in | Wt 168.2 lb

## 2015-10-08 DIAGNOSIS — Z5112 Encounter for antineoplastic immunotherapy: Secondary | ICD-10-CM | POA: Diagnosis not present

## 2015-10-08 DIAGNOSIS — C3490 Malignant neoplasm of unspecified part of unspecified bronchus or lung: Secondary | ICD-10-CM | POA: Diagnosis not present

## 2015-10-08 DIAGNOSIS — Z79899 Other long term (current) drug therapy: Secondary | ICD-10-CM

## 2015-10-08 DIAGNOSIS — C3411 Malignant neoplasm of upper lobe, right bronchus or lung: Secondary | ICD-10-CM

## 2015-10-08 LAB — CBC WITH DIFFERENTIAL/PLATELET
BASO%: 0.3 % (ref 0.0–2.0)
Basophils Absolute: 0 10*3/uL (ref 0.0–0.1)
EOS ABS: 0.3 10*3/uL (ref 0.0–0.5)
EOS%: 4.7 % (ref 0.0–7.0)
HCT: 32.6 % — ABNORMAL LOW (ref 38.4–49.9)
HEMOGLOBIN: 10.5 g/dL — AB (ref 13.0–17.1)
LYMPH#: 0.8 10*3/uL — AB (ref 0.9–3.3)
LYMPH%: 13.3 % — AB (ref 14.0–49.0)
MCH: 30.2 pg (ref 27.2–33.4)
MCHC: 32.2 g/dL (ref 32.0–36.0)
MCV: 93.7 fL (ref 79.3–98.0)
MONO#: 0.4 10*3/uL (ref 0.1–0.9)
MONO%: 6.8 % (ref 0.0–14.0)
NEUT%: 74.9 % (ref 39.0–75.0)
NEUTROS ABS: 4.4 10*3/uL (ref 1.5–6.5)
Platelets: 166 10*3/uL (ref 140–400)
RBC: 3.48 10*6/uL — AB (ref 4.20–5.82)
RDW: 16.5 % — AB (ref 11.0–14.6)
WBC: 5.9 10*3/uL (ref 4.0–10.3)

## 2015-10-08 LAB — COMPREHENSIVE METABOLIC PANEL
ALT: 10 U/L (ref 0–55)
ANION GAP: 7 meq/L (ref 3–11)
AST: 13 U/L (ref 5–34)
Albumin: 3.3 g/dL — ABNORMAL LOW (ref 3.5–5.0)
Alkaline Phosphatase: 86 U/L (ref 40–150)
BILIRUBIN TOTAL: 0.31 mg/dL (ref 0.20–1.20)
BUN: 22.4 mg/dL (ref 7.0–26.0)
CHLORIDE: 107 meq/L (ref 98–109)
CO2: 27 meq/L (ref 22–29)
Calcium: 9.1 mg/dL (ref 8.4–10.4)
Creatinine: 1.3 mg/dL (ref 0.7–1.3)
EGFR: 53 mL/min/{1.73_m2} — AB (ref >90)
GLUCOSE: 91 mg/dL (ref 70–140)
POTASSIUM: 4.1 meq/L (ref 3.5–5.1)
SODIUM: 141 meq/L (ref 136–145)
TOTAL PROTEIN: 7 g/dL (ref 6.4–8.3)

## 2015-10-08 LAB — TSH: TSH: 1.88 m[IU]/L (ref 0.320–4.118)

## 2015-10-08 MED ORDER — NIVOLUMAB CHEMO INJECTION 100 MG/10ML
240.0000 mg | Freq: Once | INTRAVENOUS | Status: AC
  Administered 2015-10-08: 240 mg via INTRAVENOUS
  Filled 2015-10-08: qty 8

## 2015-10-08 MED ORDER — SODIUM CHLORIDE 0.9 % IV SOLN
Freq: Once | INTRAVENOUS | Status: AC
  Administered 2015-10-08: 12:00:00 via INTRAVENOUS

## 2015-10-08 NOTE — Telephone Encounter (Signed)
Gave pt apt & avs °

## 2015-10-08 NOTE — Progress Notes (Signed)
  [facility] OFFICE PROGRESS NOTE   DIAGNOSIS: Stage IIIB (T2a., N3, M0) non-small cell lung cancer consistent with invasive adenocarcinoma with negative ALK gene translocation and negative EGFR mutation diagnosed in October of 2013   PRIOR THERAPY:  1) Concurrent chemoradiation with weekly carboplatin for AUC of 2 and paclitaxel 45 mg/M2, last dose was given 06/11/2012 with partial response.  2) Consolidation chemotherapy with carboplatin for AUC of 5 and Alimta 500 mg/M2 every 3 weeks. Status post 3 cycles last dose was given 09/12/2012 with stable disease.  3) Systemic chemotherapy again with carboplatin for AUC of 5 and Alimta 500 mg/M2 every 3 weeks. First cycle expected on 11/13/2013. Status post 3 cycles, discontinued secondary to disease progression. Carboplatin was discontinued starting from cycle #2 secondary to hypersensitivity reaction. 4) Immunotherapy with single agent Nivolumab 3 MG/TG every 2 weeks status post 8 cycles.  5) Decompressive lumbar laminectomies from L4 to S1 for removal of metastatic tumor bilateral under the care of Dr. Saintclair Halsted on 05/27/2014. 6) status post palliative radiotherapy to the L4-S1 joint region of the lower spine for a total dose of 75 GYN he completed on 07/10/2014.  CURRENT THERAPY: Treatment with immunotherapy again with single agent Nivolumab 240 MG IV every 2 weeks. First dose 06/25/2015. Status post 6 cycles   INTERVAL HISTORY:   Mr. Logan Russell returns as scheduled. He completed cycle 6 nivolumab 09/24/2015. He denies nausea/vomiting. No mouth sores. No diarrhea. No rash. He denies shortness of breath. He has an occasional cough which he relates to "pollen". No fever. No hemoptysis. He reports a good appetite. He reports stable bilateral leg swelling. He takes Lasix as needed. He has chronic back pain which he relates to surgery.  Objective:  Vital signs in last 24 hours:  Blood pressure 141/81, pulse 93, temperature 98.1 F (36.7  C), temperature source Oral, resp. rate 17, height 5' 6.5" (1.689 m), weight 168 lb 3.2 oz (76.295 kg), SpO2 100 %.    HEENT: No thrush or ulcers. Mucous membranes are moist. Lymphatics: No palpable cervical or supraclavicular lymph nodes. Resp: A few expiratory wheezes left lower lung field. Otherwise clear. Cardio: Regular rate and rhythm. GI: No hepatomegaly. Vascular: Trace to 1+ bilateral lower leg edema.     Lab Results:  Lab Results  Component Value Date   WBC 5.9 10/08/2015   HGB 10.5* 10/08/2015   HCT 32.6* 10/08/2015   MCV 93.7 10/08/2015   PLT 166 10/08/2015   NEUTROABS 4.4 10/08/2015    Imaging:  No results found.  Medications: I have reviewed the patient's current medications.  Assessment/Plan: 1. Metastatic non-small cell lung cancer currently on active treatment with nivolumab. He has completed 6 cycles.   Disposition: Mr. Nibert appears stable. He has completed 6 cycles of nivolumab. Plan to proceed with cycle 7 nivolumab today as scheduled. He will return for a follow-up visit and cycle 8 in 2 weeks. He will contact the office in the interim with any problems.    Ned Card ANP/GNP-BC   10/08/2015  10:59 AM

## 2015-10-08 NOTE — Patient Instructions (Signed)
[facility] Discharge Instructions for Patients Receiving Chemotherapy  Today you received the following chemotherapy agents Nivolumab.  To help prevent nausea and vomiting after your treatment, we encourage you to take your nausea medication as prescribed.   If you develop nausea and vomiting that is not controlled by your nausea medication, call the clinic.   BELOW ARE SYMPTOMS THAT SHOULD BE REPORTED IMMEDIATELY:  *FEVER GREATER THAN 100.5 F  *CHILLS WITH OR WITHOUT FEVER  NAUSEA AND VOMITING THAT IS NOT CONTROLLED WITH YOUR NAUSEA MEDICATION  *UNUSUAL SHORTNESS OF BREATH  *UNUSUAL BRUISING OR BLEEDING  TENDERNESS IN MOUTH AND THROAT WITH OR WITHOUT PRESENCE OF ULCERS  *URINARY PROBLEMS  *BOWEL PROBLEMS  UNUSUAL RASH Items with * indicate a potential emergency and should be followed up as soon as possible.  Feel free to call the clinic you have any questions or concerns. The clinic phone number is [phone].  Please show the CHEMO ALERT CARD at check-in to the Emergency Department and triage nurse.   

## 2015-10-22 ENCOUNTER — Other Ambulatory Visit (HOSPITAL_BASED_OUTPATIENT_CLINIC_OR_DEPARTMENT_OTHER): Payer: Medicare Other

## 2015-10-22 ENCOUNTER — Ambulatory Visit (HOSPITAL_BASED_OUTPATIENT_CLINIC_OR_DEPARTMENT_OTHER): Payer: Medicare Other | Admitting: Internal Medicine

## 2015-10-22 ENCOUNTER — Ambulatory Visit (HOSPITAL_BASED_OUTPATIENT_CLINIC_OR_DEPARTMENT_OTHER): Payer: Medicare Other

## 2015-10-22 ENCOUNTER — Encounter: Payer: Self-pay | Admitting: Internal Medicine

## 2015-10-22 VITALS — BP 146/87 | HR 91 | Temp 97.8°F | Resp 18 | Ht 66.5 in | Wt 169.4 lb

## 2015-10-22 DIAGNOSIS — C3411 Malignant neoplasm of upper lobe, right bronchus or lung: Secondary | ICD-10-CM

## 2015-10-22 DIAGNOSIS — Z5112 Encounter for antineoplastic immunotherapy: Secondary | ICD-10-CM

## 2015-10-22 DIAGNOSIS — C3490 Malignant neoplasm of unspecified part of unspecified bronchus or lung: Secondary | ICD-10-CM

## 2015-10-22 LAB — CBC WITH DIFFERENTIAL/PLATELET
BASO%: 0.2 % (ref 0.0–2.0)
Basophils Absolute: 0 10*3/uL (ref 0.0–0.1)
EOS%: 4.7 % (ref 0.0–7.0)
Eosinophils Absolute: 0.3 10*3/uL (ref 0.0–0.5)
HEMATOCRIT: 35.7 % — AB (ref 38.4–49.9)
HEMOGLOBIN: 11.5 g/dL — AB (ref 13.0–17.1)
LYMPH#: 0.8 10*3/uL — AB (ref 0.9–3.3)
LYMPH%: 11.7 % — ABNORMAL LOW (ref 14.0–49.0)
MCH: 30 pg (ref 27.2–33.4)
MCHC: 32.2 g/dL (ref 32.0–36.0)
MCV: 93.2 fL (ref 79.3–98.0)
MONO#: 0.5 10*3/uL (ref 0.1–0.9)
MONO%: 7.4 % (ref 0.0–14.0)
NEUT%: 76 % — ABNORMAL HIGH (ref 39.0–75.0)
NEUTROS ABS: 5.1 10*3/uL (ref 1.5–6.5)
PLATELETS: 203 10*3/uL (ref 140–400)
RBC: 3.83 10*6/uL — ABNORMAL LOW (ref 4.20–5.82)
RDW: 15.5 % — ABNORMAL HIGH (ref 11.0–14.6)
WBC: 6.6 10*3/uL (ref 4.0–10.3)

## 2015-10-22 LAB — COMPREHENSIVE METABOLIC PANEL
ALK PHOS: 86 U/L (ref 40–150)
ALT: 10 U/L (ref 0–55)
AST: 15 U/L (ref 5–34)
Albumin: 3.5 g/dL (ref 3.5–5.0)
Anion Gap: 10 mEq/L (ref 3–11)
BUN: 18.3 mg/dL (ref 7.0–26.0)
CO2: 24 mEq/L (ref 22–29)
Calcium: 9.6 mg/dL (ref 8.4–10.4)
Chloride: 105 mEq/L (ref 98–109)
Creatinine: 1.2 mg/dL (ref 0.7–1.3)
EGFR: 56 mL/min/{1.73_m2} — AB (ref >90)
GLUCOSE: 92 mg/dL (ref 70–140)
POTASSIUM: 3.8 meq/L (ref 3.5–5.1)
SODIUM: 139 meq/L (ref 136–145)
Total Bilirubin: 0.34 mg/dL (ref 0.20–1.20)
Total Protein: 7.5 g/dL (ref 6.4–8.3)

## 2015-10-22 MED ORDER — SODIUM CHLORIDE 0.9 % IV SOLN: Freq: Once | INTRAVENOUS | Status: DC

## 2015-10-22 MED ORDER — SODIUM CHLORIDE 0.9 % IV SOLN
240.0000 mg | Freq: Once | INTRAVENOUS | Status: AC
  Administered 2015-10-22: 240 mg via INTRAVENOUS
  Filled 2015-10-22: qty 20

## 2015-10-22 NOTE — Progress Notes (Signed)
.      [place] [phone]   [phone]  OFFICE PROGRESS NOTE  [name], MD [place] [address]  DIAGNOSIS: Stage IIIB (T2a., N3, M0) non-small cell lung cancer consistent with invasive adenocarcinoma with negative ALK gene translocation and negative EGFR mutation diagnosed in October of 2013   PRIOR THERAPY:  1) Concurrent chemoradiation with weekly carboplatin for AUC of 2 and paclitaxel 45 mg/M2, last dose was given 06/11/2012 with partial response.  2) Consolidation chemotherapy with carboplatin for AUC of 5 and Alimta 500 mg/M2 every 3 weeks. Status post 3 cycles last dose was given 09/12/2012 with stable disease.  3) Systemic chemotherapy again with carboplatin for AUC of 5 and Alimta 500 mg/M2 every 3 weeks. First cycle expected on 11/13/2013. Status post 3 cycles, discontinued secondary to disease progression. Carboplatin was discontinued starting from cycle #2 secondary to hypersensitivity reaction. 4) Immunotherapy with single agent Nivolumab 3 MG/TG every 2 weeks status post 8 cycles.  5) Decompressive lumbar laminectomies from L4 to S1 for removal of metastatic tumor bilateral under the care of Dr. Saintclair Halsted on 05/27/2014. 6) status post palliative radiotherapy to the L4-S1 joint region of the lower spine for a total dose of 75 GYN he completed on 07/10/2014.  CURRENT THERAPY: Treatment with immunotherapy again with single agent Nivolumab 240 MG IV every 2 weeks. First dose 06/25/2015. Status post 6 cycles  CHEMOTHERAPY INTENT: Palliative  CURRENT # OF CHEMOTHERAPY CYCLES: 7  CURRENT ANTIEMETICS: N/A  CURRENT SMOKING STATUS: Former Smoker  ORAL CHEMOTHERAPY AND CONSENT: None  CURRENT BISPHOSPHONATES USE: None  PAIN MANAGEMENT: 0/10  NARCOTICS INDUCED CONSTIPATION: None  LIVING WILL AND CODE STATUS: no code Blue  INTERVAL HISTORY: Logan Russell 81 y.o. male returns to the clinic today for  follow-up visit accompanied by his wife. He is rating his immunotherapy with Nivolumab very well. He denied having any nausea or vomiting, diarrhea or constipation. He has no fever or chills. He denied having any significant chest pain, shortness of breath, cough or hemoptysis. He gained few pounds from last visit. He is here today to start cycle #7 of his treatment.  MEDICAL HISTORY: Past Medical History  Diagnosis Date  . Hypertension   . History of radiation therapy 05/02/12-06/19/12    left lung mediastinal lymph nodes 66Gy/97f  . Cauda equina syndrome ([facility]   . [place] [place]  . [place] cancer ([facility]     non small cell lung ca with mets to spine  . Encounter for antineoplastic immunotherapy 06/19/2015    ALLERGIES:  has No Known Allergies.  MEDICATIONS:  Current Outpatient Prescriptions  Medication Sig Dispense Refill  . bisacodyl (DULCOLAX) 10 MG suppository Place 1 suppository (10 mg total) rectally daily as needed for severe constipation.    . furosemide (LASIX) 40 MG tablet Take 40 mg by mouth daily as needed.    .Marland KitchenguaiFENesin (MUCINEX) 600 MG 12 hr tablet Take 2 tablets (1,200 mg total) by mouth 2 (two) times daily as needed for cough or to loosen phlegm.    . Oxycodone HCl 10 MG TABS Take 10 mg by mouth every 4 (four) hours as needed. Reported on 09/24/2015  0  . senna (SENOKOT) 8.6 MG TABS tablet Take 1 tablet (8.6 mg total) by mouth daily as needed for mild constipation.    .Marland Kitchenacetaminophen (TYLENOL) 325 MG tablet Take 2 tablets (650 mg total) by mouth every 4 (four) hours as needed for  mild pain or fever (temp > 100.5). (Patient not taking: Reported on 10/22/2015)    . polyethylene glycol (MIRALAX / GLYCOLAX) packet Take 17 g by mouth daily as needed for moderate constipation. (Patient not taking: Reported on 09/24/2015)    . promethazine (PHENERGAN) 25 MG tablet Take 25 mg by mouth every 6 (six) hours as needed for nausea or vomiting. Reported on 10/22/2015     No  current facility-administered medications for this visit.    REVIEW OF SYSTEMS:  A comprehensive review of systems was negative except for: Constitutional: positive for fatigue Musculoskeletal: positive for muscle weakness   PHYSICAL EXAMINATION: General appearance: alert, cooperative, fatigued and no distress Head: Normocephalic, without obvious abnormality, atraumatic Neck: no adenopathy Lymph nodes: Cervical, supraclavicular, and axillary nodes normal. Resp: clear to auscultation bilaterally Back: symmetric, no curvature. ROM normal. No CVA tenderness. Cardio: regular rate and rhythm, S1, S2 normal, no murmur, click, rub or gallop GI: soft, non-tender; bowel sounds normal; no masses,  no organomegaly Extremities: extremities normal, atraumatic, no cyanosis or edema Neurologic: Alert and oriented X 3, normal strength and tone. Normal symmetric reflexes. Normal coordination and gait  ECOG PERFORMANCE STATUS: 2 - Symptomatic, <50% confined to bed  Blood pressure 146/87, pulse 91, temperature 97.8 F (36.6 C), temperature source Oral, resp. rate 18, height 5' 6.5" (1.689 m), weight 169 lb 6.4 oz (76.839 kg), SpO2 100 %.  LABORATORY DATA: Lab Results  Component Value Date   WBC 6.6 10/22/2015   HGB 11.5* 10/22/2015   HCT 35.7* 10/22/2015   MCV 93.2 10/22/2015   PLT 203 10/22/2015      Chemistry      Component Value Date/Time   NA 139 10/22/2015 1039   NA 134* 06/03/2014 0356   K 3.8 10/22/2015 1039   K 3.6 06/03/2014 0356   CL 101 06/03/2014 0356   CL 106 10/08/2012 1028   CO2 24 10/22/2015 1039   CO2 27 06/03/2014 0356   BUN 18.3 10/22/2015 1039   BUN 29* 06/03/2014 0356   CREATININE 1.2 10/22/2015 1039   CREATININE 1.66* 06/03/2014 0356      Component Value Date/Time   CALCIUM 9.6 10/22/2015 1039   CALCIUM 8.4 06/03/2014 0356   ALKPHOS 86 10/22/2015 1039   ALKPHOS 66 05/28/2014 0805   AST 15 10/22/2015 1039   AST 17 05/28/2014 0805   ALT 10 10/22/2015 1039    ALT 15 05/28/2014 0805   BILITOT 0.34 10/22/2015 1039   BILITOT 0.5 05/28/2014 0805       RADIOGRAPHIC STUDIES: No results found. ASSESSMENT AND PLAN: this is a very pleasant 81 years old white male with metastatic non-small cell lung cancer initially diagnosed as stage IIIB non-small cell lung cancer status post concurrent chemoradiation followed by consolidation chemotherapy.  He had evidence for disease recurrence and the patient was started on systemic chemotherapy with carboplatin and Alimta discontinued secondary to disease progression. He is currently undergoing immunotherapy with single agent Nivolumab status post 8 cycles. This was discontinued after the patient had progressive disease in the lumbar spines and require surgical resection followed by palliative radiotherapy. The recent CT scan of the chest, abdomen and pelvis showed new and increased pulmonary nodularity, suspicious for progressive metastatic disease. There was also enlargement of thoracic nodes, including a low left mediastinal node and progression of bulky abdominal lymphadenopathy. The patient was started on treatment with immunotherapy with Nivolumab status post 6 cycles. He is tolerating the treatment well except for the persistent fatigue. I  recommended for the patient to continue his treatment with immunotherapy. The patient will proceed with cycle #7 today as a scheduled. I will see him back for follow-up visit in 2 weeks for reevaluation before starting cycle #8. He was advised to call immediately if he has any concerning symptoms in the interval. The patient voices understanding of current disease status and treatment options and is in agreement with the current care plan. All questions were answered. The patient knows to call the clinic with any problems, questions or concerns. We can certainly see the patient much sooner if necessary.  Disclaimer: This note was dictated with voice recognition software. Similar  sounding words can inadvertently be transcribed and may not be corrected upon review.

## 2015-10-22 NOTE — Patient Instructions (Signed)
[facility] Discharge Instructions for Patients Receiving Chemotherapy  Today you received the following chemotherapy agents Nivolumab  To help prevent nausea and vomiting after your treatment, we encourage you to take your nausea medication     If you develop nausea and vomiting that is not controlled by your nausea medication, call the clinic.   BELOW ARE SYMPTOMS THAT SHOULD BE REPORTED IMMEDIATELY:  *FEVER GREATER THAN 100.5 F  *CHILLS WITH OR WITHOUT FEVER  NAUSEA AND VOMITING THAT IS NOT CONTROLLED WITH YOUR NAUSEA MEDICATION  *UNUSUAL SHORTNESS OF BREATH  *UNUSUAL BRUISING OR BLEEDING  TENDERNESS IN MOUTH AND THROAT WITH OR WITHOUT PRESENCE OF ULCERS  *URINARY PROBLEMS  *BOWEL PROBLEMS  UNUSUAL RASH Items with * indicate a potential emergency and should be followed up as soon as possible.  Feel free to call the clinic you have any questions or concerns. The clinic phone number is [phone].  Please show the CHEMO ALERT CARD at check-in to the Emergency Department and triage nurse.   

## 2015-11-05 ENCOUNTER — Other Ambulatory Visit (HOSPITAL_BASED_OUTPATIENT_CLINIC_OR_DEPARTMENT_OTHER): Payer: Medicare Other

## 2015-11-05 ENCOUNTER — Ambulatory Visit (HOSPITAL_BASED_OUTPATIENT_CLINIC_OR_DEPARTMENT_OTHER): Payer: Medicare Other

## 2015-11-05 ENCOUNTER — Telehealth: Payer: Self-pay | Admitting: Internal Medicine

## 2015-11-05 ENCOUNTER — Encounter: Payer: Self-pay | Admitting: Internal Medicine

## 2015-11-05 ENCOUNTER — Ambulatory Visit (HOSPITAL_BASED_OUTPATIENT_CLINIC_OR_DEPARTMENT_OTHER): Payer: Medicare Other | Admitting: Internal Medicine

## 2015-11-05 VITALS — BP 155/84 | HR 82 | Temp 98.2°F | Resp 18 | Ht 66.5 in | Wt 170.7 lb

## 2015-11-05 DIAGNOSIS — Z79899 Other long term (current) drug therapy: Secondary | ICD-10-CM | POA: Diagnosis not present

## 2015-11-05 DIAGNOSIS — C3411 Malignant neoplasm of upper lobe, right bronchus or lung: Secondary | ICD-10-CM | POA: Diagnosis not present

## 2015-11-05 DIAGNOSIS — C778 Secondary and unspecified malignant neoplasm of lymph nodes of multiple regions: Secondary | ICD-10-CM | POA: Diagnosis not present

## 2015-11-05 DIAGNOSIS — Z5112 Encounter for antineoplastic immunotherapy: Secondary | ICD-10-CM

## 2015-11-05 LAB — CBC WITH DIFFERENTIAL/PLATELET
BASO%: 1 % (ref 0.0–2.0)
Basophils Absolute: 0.1 10*3/uL (ref 0.0–0.1)
EOS%: 4 % (ref 0.0–7.0)
Eosinophils Absolute: 0.2 10*3/uL (ref 0.0–0.5)
HCT: 34.3 % — ABNORMAL LOW (ref 38.4–49.9)
HGB: 11.1 g/dL — ABNORMAL LOW (ref 13.0–17.1)
LYMPH#: 0.5 10*3/uL — AB (ref 0.9–3.3)
LYMPH%: 8.8 % — AB (ref 14.0–49.0)
MCH: 29.6 pg (ref 27.2–33.4)
MCHC: 32.3 g/dL (ref 32.0–36.0)
MCV: 91.5 fL (ref 79.3–98.0)
MONO#: 0.4 10*3/uL (ref 0.1–0.9)
MONO%: 7.6 % (ref 0.0–14.0)
NEUT%: 78.6 % — ABNORMAL HIGH (ref 39.0–75.0)
NEUTROS ABS: 4.6 10*3/uL (ref 1.5–6.5)
PLATELETS: 162 10*3/uL (ref 140–400)
RBC: 3.75 10*6/uL — AB (ref 4.20–5.82)
RDW: 15.9 % — ABNORMAL HIGH (ref 11.0–14.6)
WBC: 5.9 10*3/uL (ref 4.0–10.3)

## 2015-11-05 LAB — COMPREHENSIVE METABOLIC PANEL
ALK PHOS: 80 U/L (ref 40–150)
ALT: <9 U/L (ref 0–55)
AST: 13 U/L (ref 5–34)
Albumin: 3.4 g/dL — ABNORMAL LOW (ref 3.5–5.0)
Anion Gap: 9 mEq/L (ref 3–11)
BUN: 24.4 mg/dL (ref 7.0–26.0)
CALCIUM: 9 mg/dL (ref 8.4–10.4)
CO2: 23 mEq/L (ref 22–29)
Chloride: 107 mEq/L (ref 98–109)
Creatinine: 1.3 mg/dL (ref 0.7–1.3)
EGFR: 53 mL/min/{1.73_m2} — AB (ref >90)
Glucose: 88 mg/dl (ref 70–140)
POTASSIUM: 3.8 meq/L (ref 3.5–5.1)
Sodium: 139 mEq/L (ref 136–145)
Total Bilirubin: <0.3 mg/dL (ref 0.20–1.20)
Total Protein: 7.2 g/dL (ref 6.4–8.3)

## 2015-11-05 LAB — TSH: TSH: 1.4 m(IU)/L (ref 0.320–4.118)

## 2015-11-05 MED ORDER — SODIUM CHLORIDE 0.9 % IV SOLN
240.0000 mg | Freq: Once | INTRAVENOUS | Status: AC
  Administered 2015-11-05: 240 mg via INTRAVENOUS
  Filled 2015-11-05: qty 8

## 2015-11-05 MED ORDER — SODIUM CHLORIDE 0.9 % IV SOLN
Freq: Once | INTRAVENOUS | Status: AC
  Administered 2015-11-05: 11:00:00 via INTRAVENOUS

## 2015-11-05 NOTE — Telephone Encounter (Signed)
Gave pt apt & avs °

## 2015-11-05 NOTE — Progress Notes (Signed)
.      [place] [phone]   [phone]) [phone]  OFFICE PROGRESS NOTE  Logan Downing, MD [place] [address]  DIAGNOSIS: Stage IIIB (T2a., N3, M0) non-small cell lung cancer consistent with invasive adenocarcinoma of the right upper lobe with negative ALK gene translocation and negative EGFR mutation diagnosed in October of 2013   PRIOR THERAPY:  1) Concurrent chemoradiation with weekly carboplatin for AUC of 2 and paclitaxel 45 mg/M2, last dose was given 06/11/2012 with partial response.  2) Consolidation chemotherapy with carboplatin for AUC of 5 and Alimta 500 mg/M2 every 3 weeks. Status post 3 cycles last dose was given 09/12/2012 with stable disease.  3) Systemic chemotherapy again with carboplatin for AUC of 5 and Alimta 500 mg/M2 every 3 weeks. First cycle expected on 11/13/2013. Status post 3 cycles, discontinued secondary to disease progression. Carboplatin was discontinued starting from cycle #2 secondary to hypersensitivity reaction. 4) Immunotherapy with single agent Nivolumab 3 MG/TG every 2 weeks status post 8 cycles.  5) Decompressive lumbar laminectomies from L4 to S1 for removal of metastatic tumor bilateral under the care of Dr. Saintclair Russell on 05/27/2014. 6) status post palliative radiotherapy to the L4-S1 joint region of the lower spine for a total dose of 75 GYN he completed on 07/10/2014.  CURRENT THERAPY: Treatment with immunotherapy again with single agent Nivolumab 240 MG IV every 2 weeks. First dose 06/25/2015. Status post 7 cycles  CHEMOTHERAPY INTENT: Palliative  CURRENT # OF CHEMOTHERAPY CYCLES: 8  CURRENT ANTIEMETICS: N/A  CURRENT SMOKING STATUS: Former Smoker  ORAL CHEMOTHERAPY AND CONSENT: None  CURRENT BISPHOSPHONATES USE: None  PAIN MANAGEMENT: 0/10  NARCOTICS INDUCED CONSTIPATION: None  LIVING WILL AND CODE STATUS: no code Blue  INTERVAL HISTORY: Logan Russell 81 y.o. male returns to the  clinic today for follow-up visit accompanied by his wife. He tolerated the last cycle of his immunotherapy with Nivolumab very well. He denied having any nausea or vomiting, diarrhea or constipation. He has no fever or chills. He denied having any significant chest pain, shortness of breath, cough or hemoptysis. He denied having any significant weight loss or night sweats. He is here today to start cycle #8 of his treatment.  MEDICAL HISTORY: Past Medical History  Diagnosis Date  . Hypertension   . History of radiation therapy 05/02/12-06/19/12    left lung mediastinal lymph nodes 66Gy/35f  . Cauda equina syndrome ([facility]   . [place] 04/[place]  . Bone cancer ([facility]     non small cell lung ca with mets to spine  . Encounter for antineoplastic immunotherapy 06/19/2015    ALLERGIES:  has No Known Allergies.  MEDICATIONS:  Current Outpatient Prescriptions  Medication Sig Dispense Refill  . acetaminophen (TYLENOL) 325 MG tablet Take 2 tablets (650 mg total) by mouth every 4 (four) hours as needed for mild pain or fever (temp > 100.5).    . bisacodyl (DULCOLAX) 10 MG suppository Place 1 suppository (10 mg total) rectally daily as needed for severe constipation.    . furosemide (LASIX) 40 MG tablet Take 40 mg by mouth daily as needed.    .Marland KitchenguaiFENesin (MUCINEX) 600 MG 12 hr tablet Take 2 tablets (1,200 mg total) by mouth 2 (two) times daily as needed for cough or to loosen phlegm.    . Oxycodone HCl 10 MG TABS Take 10 mg by mouth every 4 (four) hours as needed. Reported on 09/24/2015  0  . polyethylene glycol (  MIRALAX / GLYCOLAX) packet Take 17 g by mouth daily as needed for moderate constipation.    . promethazine (PHENERGAN) 25 MG tablet Take 25 mg by mouth every 6 (six) hours as needed for nausea or vomiting. Reported on 10/22/2015    . senna (SENOKOT) 8.6 MG TABS tablet Take 1 tablet (8.6 mg total) by mouth daily as needed for mild constipation.     No current facility-administered  medications for this visit.    REVIEW OF SYSTEMS:  A comprehensive review of systems was negative except for: Constitutional: positive for fatigue Musculoskeletal: positive for muscle weakness   PHYSICAL EXAMINATION: General appearance: alert, cooperative, fatigued and no distress Head: Normocephalic, without obvious abnormality, atraumatic Neck: no adenopathy Lymph nodes: Cervical, supraclavicular, and axillary nodes normal. Resp: clear to auscultation bilaterally Back: symmetric, no curvature. ROM normal. No CVA tenderness. Cardio: regular rate and rhythm, S1, S2 normal, no murmur, click, rub or gallop GI: soft, non-tender; bowel sounds normal; no masses,  no organomegaly Extremities: extremities normal, atraumatic, no cyanosis or edema Neurologic: Alert and oriented X 3, normal strength and tone. Normal symmetric reflexes. Normal coordination and gait  ECOG PERFORMANCE STATUS: 2 - Symptomatic, <50% confined to bed  Blood pressure 155/84, pulse 82, temperature 98.2 F (36.8 C), temperature source Oral, resp. rate 18, height 5' 6.5" (1.689 m), weight 170 lb 11.2 oz (77.429 kg), SpO2 99 %.  LABORATORY DATA: Lab Results  Component Value Date   WBC 5.9 11/05/2015   HGB 11.1* 11/05/2015   HCT 34.3* 11/05/2015   MCV 91.5 11/05/2015   PLT 162 11/05/2015      Chemistry      Component Value Date/Time   NA 139 10/22/2015 1039   NA 134* 06/03/2014 0356   K 3.8 10/22/2015 1039   K 3.6 06/03/2014 0356   CL 101 06/03/2014 0356   CL 106 10/08/2012 1028   CO2 24 10/22/2015 1039   CO2 27 06/03/2014 0356   BUN 18.3 10/22/2015 1039   BUN 29* 06/03/2014 0356   CREATININE 1.2 10/22/2015 1039   CREATININE 1.66* 06/03/2014 0356      Component Value Date/Time   CALCIUM 9.6 10/22/2015 1039   CALCIUM 8.4 06/03/2014 0356   ALKPHOS 86 10/22/2015 1039   ALKPHOS 66 05/28/2014 0805   AST 15 10/22/2015 1039   AST 17 05/28/2014 0805   ALT 10 10/22/2015 1039   ALT 15 05/28/2014 0805    BILITOT 0.34 10/22/2015 1039   BILITOT 0.5 05/28/2014 0805       RADIOGRAPHIC STUDIES: No results found. ASSESSMENT AND PLAN: this is a very pleasant 81 years old white male with metastatic non-small cell lung cancer initially diagnosed as stage IIIB non-small cell lung cancer status post concurrent chemoradiation followed by consolidation chemotherapy.  He had evidence for disease recurrence and the patient was started on systemic chemotherapy with carboplatin and Alimta discontinued secondary to disease progression. He is currently undergoing immunotherapy with single agent Nivolumab status post 8 cycles. This was discontinued after the patient had progressive disease in the lumbar spines and require surgical resection followed by palliative radiotherapy. The recent CT scan of the chest, abdomen and pelvis showed new and increased pulmonary nodularity, suspicious for progressive metastatic disease. There was also enlargement of thoracic nodes, including a low left mediastinal node and progression of bulky abdominal lymphadenopathy. The patient was started on treatment with immunotherapy with Nivolumab status post 7 cycles. He is tolerating the treatment well except for the persistent fatigue. I recommended  for the patient to continue his treatment with immunotherapy. The patient will proceed with cycle #8 today as a scheduled. I will see him back for follow-up visit in 2 weeks for reevaluation before starting cycle #9 after repeating CT scan of the chest, abdomen and pelvis for restaging of his disease. He was advised to call immediately if he has any concerning symptoms in the interval. The patient voices understanding of current disease status and treatment options and is in agreement with the current care plan. All questions were answered. The patient knows to call the clinic with any problems, questions or concerns. We can certainly see the patient much sooner if necessary.  Disclaimer: This  note was dictated with voice recognition software. Similar sounding words can inadvertently be transcribed and may not be corrected upon review.

## 2015-11-05 NOTE — Patient Instructions (Signed)
[facility] Discharge Instructions for Patients Receiving Chemotherapy  Today you received the following chemotherapy agents Nivolumab  To help prevent nausea and vomiting after your treatment, we encourage you to take your nausea medication     If you develop nausea and vomiting that is not controlled by your nausea medication, call the clinic.   BELOW ARE SYMPTOMS THAT SHOULD BE REPORTED IMMEDIATELY:  *FEVER GREATER THAN 100.5 F  *CHILLS WITH OR WITHOUT FEVER  NAUSEA AND VOMITING THAT IS NOT CONTROLLED WITH YOUR NAUSEA MEDICATION  *UNUSUAL SHORTNESS OF BREATH  *UNUSUAL BRUISING OR BLEEDING  TENDERNESS IN MOUTH AND THROAT WITH OR WITHOUT PRESENCE OF ULCERS  *URINARY PROBLEMS  *BOWEL PROBLEMS  UNUSUAL RASH Items with * indicate a potential emergency and should be followed up as soon as possible.  Feel free to call the clinic you have any questions or concerns. The clinic phone number is [phone].  Please show the CHEMO ALERT CARD at check-in to the Emergency Department and triage nurse.   

## 2015-11-17 ENCOUNTER — Ambulatory Visit (HOSPITAL_COMMUNITY)
Admission: RE | Admit: 2015-11-17 | Discharge: 2015-11-17 | Disposition: A | Discharge status: Home / Self Care | Payer: Medicare Other | Source: Ambulatory Visit | Attending: Internal Medicine | Admitting: Internal Medicine

## 2015-11-17 ENCOUNTER — Encounter (HOSPITAL_COMMUNITY): Payer: Self-pay

## 2015-11-17 DIAGNOSIS — R918 Other nonspecific abnormal finding of lung field: Secondary | ICD-10-CM | POA: Insufficient documentation

## 2015-11-17 DIAGNOSIS — Z5112 Encounter for antineoplastic immunotherapy: Secondary | ICD-10-CM

## 2015-11-17 DIAGNOSIS — R590 Localized enlarged lymph nodes: Secondary | ICD-10-CM | POA: Diagnosis not present

## 2015-11-17 DIAGNOSIS — C3411 Malignant neoplasm of upper lobe, right bronchus or lung: Secondary | ICD-10-CM | POA: Diagnosis present

## 2015-11-17 MED ORDER — DIATRIZOATE MEGLUMINE & SODIUM 66-10 % PO SOLN: 30.0000 mL | Freq: Once | ORAL | Status: DC

## 2015-11-17 MED ORDER — DIATRIZOATE MEGLUMINE & SODIUM 66-10 % PO SOLN
15.0000 mL | ORAL | Status: DC | PRN
  Administered 2015-11-17: 30 mL via ORAL

## 2015-11-17 MED ORDER — IOPAMIDOL (ISOVUE-300) INJECTION 61%
100.0000 mL | Freq: Once | INTRAVENOUS | Status: AC | PRN
  Administered 2015-11-17: 100 mL via INTRAVENOUS

## 2015-11-19 ENCOUNTER — Telehealth: Payer: Self-pay | Admitting: Internal Medicine

## 2015-11-19 ENCOUNTER — Ambulatory Visit (HOSPITAL_BASED_OUTPATIENT_CLINIC_OR_DEPARTMENT_OTHER): Payer: Medicare Other | Admitting: Internal Medicine

## 2015-11-19 ENCOUNTER — Other Ambulatory Visit (HOSPITAL_BASED_OUTPATIENT_CLINIC_OR_DEPARTMENT_OTHER): Payer: Medicare Other

## 2015-11-19 ENCOUNTER — Ambulatory Visit: Payer: Medicare Other

## 2015-11-19 ENCOUNTER — Encounter: Payer: Self-pay | Admitting: Internal Medicine

## 2015-11-19 VITALS — BP 133/75 | HR 88 | Temp 98.5°F | Resp 18 | Ht 66.5 in | Wt 171.3 lb

## 2015-11-19 DIAGNOSIS — C3411 Malignant neoplasm of upper lobe, right bronchus or lung: Secondary | ICD-10-CM

## 2015-11-19 DIAGNOSIS — C7951 Secondary malignant neoplasm of bone: Secondary | ICD-10-CM | POA: Diagnosis not present

## 2015-11-19 DIAGNOSIS — Z5112 Encounter for antineoplastic immunotherapy: Secondary | ICD-10-CM

## 2015-11-19 LAB — CBC WITH DIFFERENTIAL/PLATELET
BASO%: 0.5 % (ref 0.0–2.0)
BASOS ABS: 0 10*3/uL (ref 0.0–0.1)
EOS ABS: 0.2 10*3/uL (ref 0.0–0.5)
EOS%: 3.6 % (ref 0.0–7.0)
HEMATOCRIT: 34.6 % — AB (ref 38.4–49.9)
HEMOGLOBIN: 11.5 g/dL — AB (ref 13.0–17.1)
LYMPH#: 0.5 10*3/uL — AB (ref 0.9–3.3)
LYMPH%: 8.5 % — ABNORMAL LOW (ref 14.0–49.0)
MCH: 29.9 pg (ref 27.2–33.4)
MCHC: 33.2 g/dL (ref 32.0–36.0)
MCV: 90.2 fL (ref 79.3–98.0)
MONO#: 0.4 10*3/uL (ref 0.1–0.9)
MONO%: 6.5 % (ref 0.0–14.0)
NEUT#: 5.1 10*3/uL (ref 1.5–6.5)
NEUT%: 80.9 % — AB (ref 39.0–75.0)
PLATELETS: 189 10*3/uL (ref 140–400)
RBC: 3.83 10*6/uL — AB (ref 4.20–5.82)
RDW: 15.7 % — AB (ref 11.0–14.6)
WBC: 6.3 10*3/uL (ref 4.0–10.3)

## 2015-11-19 LAB — COMPREHENSIVE METABOLIC PANEL
ALT: 9 U/L (ref 0–55)
ANION GAP: 8 meq/L (ref 3–11)
AST: 13 U/L (ref 5–34)
Albumin: 3.3 g/dL — ABNORMAL LOW (ref 3.5–5.0)
Alkaline Phosphatase: 84 U/L (ref 40–150)
BILIRUBIN TOTAL: 0.31 mg/dL (ref 0.20–1.20)
BUN: 21.3 mg/dL (ref 7.0–26.0)
CHLORIDE: 107 meq/L (ref 98–109)
CO2: 23 meq/L (ref 22–29)
Calcium: 9 mg/dL (ref 8.4–10.4)
Creatinine: 1.4 mg/dL — ABNORMAL HIGH (ref 0.7–1.3)
EGFR: 50 mL/min/{1.73_m2} — AB (ref >90)
GLUCOSE: 97 mg/dL (ref 70–140)
Potassium: 3.7 mEq/L (ref 3.5–5.1)
SODIUM: 139 meq/L (ref 136–145)
TOTAL PROTEIN: 7.2 g/dL (ref 6.4–8.3)

## 2015-11-19 NOTE — Progress Notes (Signed)
[facility] [phone]   [phone]  OFFICE PROGRESS NOTE  Logan Mask, MD [address] [place] [address]  DIAGNOSIS: Stage IIIB (T2a., N3, M0) non-small cell lung cancer consistent with invasive adenocarcinoma of the right upper lobe with negative ALK gene translocation and negative EGFR mutation diagnosed in October of 2013   PRIOR THERAPY:  1) Concurrent chemoradiation with weekly carboplatin for AUC of 2 and paclitaxel 45 mg/M2, last dose was given 06/11/2012 with partial response.  2) Consolidation chemotherapy with carboplatin for AUC of 5 and Alimta 500 mg/M2 every 3 weeks. Status post 3 cycles last dose was given 09/12/2012 with stable disease.  3) Systemic chemotherapy again with carboplatin for AUC of 5 and Alimta 500 mg/M2 every 3 weeks. First cycle expected on 11/13/2013. Status post 3 cycles, discontinued secondary to disease progression. Carboplatin was discontinued starting from cycle #2 secondary to hypersensitivity reaction. 4) Immunotherapy with single agent Nivolumab 3 MG/TG every 2 weeks status post 8 cycles.  5) Decompressive lumbar laminectomies from L4 to S1 for removal of metastatic tumor bilateral under the care of Dr. Wynetta Emery on 05/27/2014. 6) status post palliative radiotherapy to the L4-S1 joint region of the lower spine for a total dose of 75 GYN he completed on 07/10/2014.  CURRENT THERAPY: Treatment with immunotherapy again with single agent Nivolumab 240 MG IV every 2 weeks. First dose 06/25/2015. Status post 8 cycles, last dose was given 11/05/2015 discontinued secondary to disease progression.  CHEMOTHERAPY INTENT: Palliative  CURRENT # OF CHEMOTHERAPY CYCLES: 8  CURRENT ANTIEMETICS: N/A  CURRENT SMOKING STATUS: Former Smoker  ORAL CHEMOTHERAPY AND CONSENT: None  CURRENT BISPHOSPHONATES USE: None  PAIN MANAGEMENT: 0/10  NARCOTICS INDUCED CONSTIPATION: None  LIVING WILL AND CODE  STATUS: no code Blue  INTERVAL HISTORY: Logan Russell 81 y.o. male returns to the clinic today for follow-up visit accompanied by his wife. He tolerated the last cycle of his immunotherapy with Nivolumab very well. He denied having any nausea or vomiting, diarrhea or constipation. He has no fever or chills. He denied having any significant chest pain, shortness of breath, cough or hemoptysis. He denied having any significant weight loss or night sweats. He had repeat CT scan of the chest, abdomen but has performed recently and he is here for evaluation and discussion of his scan results.  MEDICAL HISTORY: Past Medical History  Diagnosis Date  . Hypertension   . History of radiation therapy 05/02/12-06/19/12    left lung mediastinal lymph nodes 66Gy/32fx  . Cauda equina syndrome (HCC)   . Lung cancer (HCC) 04/12/2012  . 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 Encounter for antineoplastic immunotherapy 06/19/2015    ALLERGIES:  has No Known Allergies.  MEDICATIONS:  Current Outpatient Prescriptions  Medication Sig Dispense Refill  . acetaminophen (TYLENOL) 325 MG tablet Take 2 tablets (650 mg total) by mouth every 4 (four) hours as needed for mild pain or fever (temp > 100.5).    . bisacodyl (DULCOLAX) 10 MG suppository Place 1 suppository (10 mg total) rectally daily as needed for severe constipation.    . furosemide (LASIX) 40 MG tablet Take 40 mg by mouth daily as needed.    Marland Kitchen guaiFENesin (MUCINEX) 600 MG 12 hr tablet Take 2 tablets (1,200 mg total) by mouth 2 (two) times daily as needed for cough or to loosen phlegm.    . Oxycodone  HCl 10 MG TABS Take 10 mg by mouth every 4 (four) hours as needed. Reported on 09/24/2015  0  . polyethylene glycol (MIRALAX / GLYCOLAX) packet Take 17 g by mouth daily as needed for moderate constipation.    . promethazine (PHENERGAN) 25 MG tablet Take 25 mg by mouth every 6 (six) hours as needed for nausea or vomiting. Reported on  10/22/2015    . senna (SENOKOT) 8.6 MG TABS tablet Take 1 tablet (8.6 mg total) by mouth daily as needed for mild constipation.     No current facility-administered medications for this visit.    REVIEW OF SYSTEMS:  A comprehensive review of systems was negative except for: Constitutional: positive for fatigue Musculoskeletal: positive for muscle weakness   PHYSICAL EXAMINATION: General appearance: alert, cooperative, fatigued and no distress Head: Normocephalic, without obvious abnormality, atraumatic Neck: no adenopathy Lymph nodes: Cervical, supraclavicular, and axillary nodes normal. Resp: clear to auscultation bilaterally Back: symmetric, no curvature. ROM normal. No CVA tenderness. Cardio: regular rate and rhythm, S1, S2 normal, no murmur, click, rub or gallop GI: soft, non-tender; bowel sounds normal; no masses,  no organomegaly Extremities: extremities normal, atraumatic, no cyanosis or edema Neurologic: Alert and oriented X 3, normal strength and tone. Normal symmetric reflexes. Normal coordination and gait  ECOG PERFORMANCE STATUS: 2 - Symptomatic, <50% confined to bed  Blood pressure 133/75, pulse 88, temperature 98.5 F (36.9 C), temperature source Oral, resp. rate 18, height 5' 6.5" (1.689 m), weight 171 lb 4.8 oz (77.701 kg), SpO2 100 %.  LABORATORY DATA: Lab Results  Component Value Date   WBC 6.3 11/19/2015   HGB 11.5* 11/19/2015   HCT 34.6* 11/19/2015   MCV 90.2 11/19/2015   PLT 189 11/19/2015      Chemistry      Component Value Date/Time   NA 139 11/19/2015 1049   NA 134* 06/03/2014 0356   K 3.7 11/19/2015 1049   K 3.6 06/03/2014 0356   CL 101 06/03/2014 0356   CL 106 10/08/2012 1028   CO2 23 11/19/2015 1049   CO2 27 06/03/2014 0356   BUN 21.3 11/19/2015 1049   BUN 29* 06/03/2014 0356   CREATININE 1.4* 11/19/2015 1049   CREATININE 1.66* 06/03/2014 0356      Component Value Date/Time   CALCIUM 9.0 11/19/2015 1049   CALCIUM 8.4 06/03/2014 0356    ALKPHOS 84 11/19/2015 1049   ALKPHOS 66 05/28/2014 0805   AST 13 11/19/2015 1049   AST 17 05/28/2014 0805   ALT 9 11/19/2015 1049   ALT 15 05/28/2014 0805   BILITOT 0.31 11/19/2015 1049   BILITOT 0.5 05/28/2014 0805       RADIOGRAPHIC STUDIES: Ct Chest W Contrast  11/17/2015  CLINICAL DATA:  81 year old male with history of lung cancer originally diagnosed in 2013 when the patient had both right upper lobe and left lower lobe lesions (at which point biopsy of a left lower lobe confirmed invasive adenocarcinoma on 04/04/2012), with recurrence in 2015. Immunotherapy ongoing. Radiation therapy complete. Followup study. EXAM: CT CHEST, ABDOMEN, AND PELVIS WITH CONTRAST TECHNIQUE: Multidetector CT imaging of the chest, abdomen and pelvis was performed following the standard protocol during bolus administration of intravenous contrast. CONTRAST:  112m ISOVUE-300 IOPAMIDOL (ISOVUE-300) INJECTION 61% COMPARISON:  Multiple priors, most recently CT of the chest, abdomen and pelvis 09/07/2015. FINDINGS: CT CHEST FINDINGS Mediastinum/Lymph Nodes: Heart size is normal. There is no significant pericardial fluid, thickening or pericardial calcification. There is atherosclerosis of the thoracic aorta, the great  vessels of the mediastinum and the coronary arteries, including calcified atherosclerotic plaque in the left main, left anterior descending, left circumflex and right coronary arteries. Mildly enlarged low right paratracheal lymph node measuring 11 mm in short axis is unchanged. Prominent amorphous nodal tissue in the subcarinal region measuring 12 mm in short axis. Amorphous soft tissue around the bronchus intermedius and proximal right lower lobe bronchi, similar to the prior study. Retroesophageal lymph node measuring 1 cm in short axis, slightly increased compared to the prior study. Esophagus is unremarkable in appearance. No axillary lymphadenopathy. Lungs/Pleura: The previously noted nodule in the right  lower lobe is slightly larger than prior examinations measuring 14 x 10 x 12 mm on today's study (axial image 62 of series 7 and coronal image 97 of series 603). This lesion has slightly more convex margins, and continues to be surrounded by ground-glass attenuation and some septal thickening, which has increased slightly compared to the prior study. Increasingly conspicuous subpleural nodule in the medial aspect of the left lung base abutting the left hemidiaphragm (axial image 118 of series 7 and coronal image 82 of series 603) currently measures 19 x 24 x 16 mm. Previously noted left lower lobe subpleural nodule measuring 11 mm (image 102 of series 7) is unchanged. Previously noted right lower lobe pulmonary nodule appears slightly larger than the prior study, currently measuring 8 mm with macrolobulated and slightly spiculated margins (image 104 of series 7), previously only 5 mm. 5 x 7 mm subpleural nodule in the inferior aspect of the left lower lobe (image 103 of series 7) slightly more conspicuous than prior studies. In the left lower lobe at the site of the previously treated left lower lobe lesion there continues to be mass-like area of architectural distortion, most compatible with an area of postradiation mass-like fibrosis. There are similar findings in the medial aspect of the right lung which are most pronounced throughout the perihilar distribution. No acute consolidative airspace disease. No pleural effusions. Mild diffuse bronchial wall thickening with mild centrilobular and paraseptal emphysema. Musculoskeletal/Soft Tissues: Old healed bilateral rib fractures. There are no aggressive appearing lytic or blastic lesions noted in the visualized portions of the skeleton. CT ABDOMEN AND PELVIS FINDINGS Hepatobiliary: Multiple small well-defined low-attenuation lesions are noted in the liver, similar to prior examinations, the majority of which are too small to definitively characterize, but favored to  represent tiny cysts. The largest hepatic lesion measures 11 mm in segment 3 and is compatible with a simple cyst. No new suspicious hepatic lesions are otherwise identified. No intra or extrahepatic biliary ductal dilatation. Gallbladder is normal in appearance. Pancreas: No pancreatic mass. No pancreatic ductal dilatation. No pancreatic or peripancreatic fluid or inflammatory changes. Spleen: Unremarkable. Adrenals/Urinary Tract: 1.4 cm simple cyst in the posterior aspect of the upper pole of the left kidney. Other sub cm low-attenuation lesions in the kidneys bilaterally are too small to definitively characterize, but similar to prior studies, favored to represent cysts. Bilateral adrenal glands are normal in appearance. No hydroureteronephrosis. Urinary bladder is normal in appearance. Stomach/Bowel: Normal appearance of the stomach. Large duodenal diverticulum extending off the medial aspect of the second portion of the duodenum exerting mass effect upon the adjacent pancreatic head. No surrounding inflammatory changes to suggest an acute duodenal diverticulitis at this time. Numerous colonic diverticulae are noted, without surrounding inflammatory changes to suggest an acute diverticulitis at this time. Normal appendix. Vascular/Lymphatic: Atherosclerotic disease throughout the abdominal and pelvic vasculature, including fusiform ectasia of the infrarenal  abdominal aorta which measures up to 2.5 x 3.1 cm (mean diameter of 2.8 cm). Extensive mesenteric and retroperitoneal lymphadenopathy is again noted. Mesenteric lymphadenopathy appears stable to slightly increased compared to the prior study, best demonstrated by a 18 mm short axis lymph node in the root of the small bowel mesenteric (image 62 of series 2) which previously measured only 15 mm. Retroperitoneal lymphadenopathy is also generally stable to increased compared to the prior study, with interval enlargement of a 21 mm short axis right-sided  retrocaval lymph node (image 61 of series 2) which previously measured only 16 mm. Bulky celiac axis lymph nodes also appear enlarged compared to the prior study, with the largest of these currently measuring 23 mm in short axis (image 54 of series 2), compared with 14 mm in short axis on the prior examination. Multiple borderline enlarged pelvic lymph nodes are noted, but are nonspecific. Reproductive: Prostate gland and seminal vesicles are unremarkable in appearance. Other: No significant volume of ascites.  No pneumoperitoneum. Musculoskeletal: Status post laminectomy at L5. Irregular areas of sclerosis and lucency throughout the lower lumbar spine, potentially related to prior radiation therapy. Chronic insufficiency fractures of the sacral ala bilaterally appear similar to the prior study. There are no aggressive appearing lytic or blastic lesions noted in the visualized portions of the skeleton. IMPRESSION: 1. Today's study demonstrates progression of disease as evidenced by interval enlargement of numerous previously noted pulmonary nodules, as detailed above. In addition, retroperitoneal, mesenteric and upper abdominal lymphadenopathy appears progressive compared to the prior study. Mediastinal and right hilar lymphadenopathy appears generally very similar to the prior examination. 2. Additional incidental findings, as above. Electronically Signed   By: Vinnie Langton M.D.   On: 11/17/2015 16:15   Ct Abdomen Pelvis W Contrast  11/17/2015  CLINICAL DATA:  81 year old male with history of lung cancer originally diagnosed in 2013 when the patient had both right upper lobe and left lower lobe lesions (at which point biopsy of a left lower lobe confirmed invasive adenocarcinoma on 04/04/2012), with recurrence in 2015. Immunotherapy ongoing. Radiation therapy complete. Followup study. EXAM: CT CHEST, ABDOMEN, AND PELVIS WITH CONTRAST TECHNIQUE: Multidetector CT imaging of the chest, abdomen and pelvis was  performed following the standard protocol during bolus administration of intravenous contrast. CONTRAST:  163m ISOVUE-300 IOPAMIDOL (ISOVUE-300) INJECTION 61% COMPARISON:  Multiple priors, most recently CT of the chest, abdomen and pelvis 09/07/2015. FINDINGS: CT CHEST FINDINGS Mediastinum/Lymph Nodes: Heart size is normal. There is no significant pericardial fluid, thickening or pericardial calcification. There is atherosclerosis of the thoracic aorta, the great vessels of the mediastinum and the coronary arteries, including calcified atherosclerotic plaque in the left main, left anterior descending, left circumflex and right coronary arteries. Mildly enlarged low right paratracheal lymph node measuring 11 mm in short axis is unchanged. Prominent amorphous nodal tissue in the subcarinal region measuring 12 mm in short axis. Amorphous soft tissue around the bronchus intermedius and proximal right lower lobe bronchi, similar to the prior study. Retroesophageal lymph node measuring 1 cm in short axis, slightly increased compared to the prior study. Esophagus is unremarkable in appearance. No axillary lymphadenopathy. Lungs/Pleura: The previously noted nodule in the right lower lobe is slightly larger than prior examinations measuring 14 x 10 x 12 mm on today's study (axial image 62 of series 7 and coronal image 97 of series 603). This lesion has slightly more convex margins, and continues to be surrounded by ground-glass attenuation and some septal thickening, which has increased slightly compared  to the prior study. Increasingly conspicuous subpleural nodule in the medial aspect of the left lung base abutting the left hemidiaphragm (axial image 118 of series 7 and coronal image 82 of series 603) currently measures 19 x 24 x 16 mm. Previously noted left lower lobe subpleural nodule measuring 11 mm (image 102 of series 7) is unchanged. Previously noted right lower lobe pulmonary nodule appears slightly larger than the  prior study, currently measuring 8 mm with macrolobulated and slightly spiculated margins (image 104 of series 7), previously only 5 mm. 5 x 7 mm subpleural nodule in the inferior aspect of the left lower lobe (image 103 of series 7) slightly more conspicuous than prior studies. In the left lower lobe at the site of the previously treated left lower lobe lesion there continues to be mass-like area of architectural distortion, most compatible with an area of postradiation mass-like fibrosis. There are similar findings in the medial aspect of the right lung which are most pronounced throughout the perihilar distribution. No acute consolidative airspace disease. No pleural effusions. Mild diffuse bronchial wall thickening with mild centrilobular and paraseptal emphysema. Musculoskeletal/Soft Tissues: Old healed bilateral rib fractures. There are no aggressive appearing lytic or blastic lesions noted in the visualized portions of the skeleton. CT ABDOMEN AND PELVIS FINDINGS Hepatobiliary: Multiple small well-defined low-attenuation lesions are noted in the liver, similar to prior examinations, the majority of which are too small to definitively characterize, but favored to represent tiny cysts. The largest hepatic lesion measures 11 mm in segment 3 and is compatible with a simple cyst. No new suspicious hepatic lesions are otherwise identified. No intra or extrahepatic biliary ductal dilatation. Gallbladder is normal in appearance. Pancreas: No pancreatic mass. No pancreatic ductal dilatation. No pancreatic or peripancreatic fluid or inflammatory changes. Spleen: Unremarkable. Adrenals/Urinary Tract: 1.4 cm simple cyst in the posterior aspect of the upper pole of the left kidney. Other sub cm low-attenuation lesions in the kidneys bilaterally are too small to definitively characterize, but similar to prior studies, favored to represent cysts. Bilateral adrenal glands are normal in appearance. No hydroureteronephrosis.  Urinary bladder is normal in appearance. Stomach/Bowel: Normal appearance of the stomach. Large duodenal diverticulum extending off the medial aspect of the second portion of the duodenum exerting mass effect upon the adjacent pancreatic head. No surrounding inflammatory changes to suggest an acute duodenal diverticulitis at this time. Numerous colonic diverticulae are noted, without surrounding inflammatory changes to suggest an acute diverticulitis at this time. Normal appendix. Vascular/Lymphatic: Atherosclerotic disease throughout the abdominal and pelvic vasculature, including fusiform ectasia of the infrarenal abdominal aorta which measures up to 2.5 x 3.1 cm (mean diameter of 2.8 cm). Extensive mesenteric and retroperitoneal lymphadenopathy is again noted. Mesenteric lymphadenopathy appears stable to slightly increased compared to the prior study, best demonstrated by a 18 mm short axis lymph node in the root of the small bowel mesenteric (image 62 of series 2) which previously measured only 15 mm. Retroperitoneal lymphadenopathy is also generally stable to increased compared to the prior study, with interval enlargement of a 21 mm short axis right-sided retrocaval lymph node (image 61 of series 2) which previously measured only 16 mm. Bulky celiac axis lymph nodes also appear enlarged compared to the prior study, with the largest of these currently measuring 23 mm in short axis (image 54 of series 2), compared with 14 mm in short axis on the prior examination. Multiple borderline enlarged pelvic lymph nodes are noted, but are nonspecific. Reproductive: Prostate gland and seminal vesicles  are unremarkable in appearance. Other: No significant volume of ascites.  No pneumoperitoneum. Musculoskeletal: Status post laminectomy at L5. Irregular areas of sclerosis and lucency throughout the lower lumbar spine, potentially related to prior radiation therapy. Chronic insufficiency fractures of the sacral ala  bilaterally appear similar to the prior study. There are no aggressive appearing lytic or blastic lesions noted in the visualized portions of the skeleton. IMPRESSION: 1. Today's study demonstrates progression of disease as evidenced by interval enlargement of numerous previously noted pulmonary nodules, as detailed above. In addition, retroperitoneal, mesenteric and upper abdominal lymphadenopathy appears progressive compared to the prior study. Mediastinal and right hilar lymphadenopathy appears generally very similar to the prior examination. 2. Additional incidental findings, as above. Electronically Signed   By: Vinnie Langton M.D.   On: 11/17/2015 16:15   ASSESSMENT AND PLAN: this is a very pleasant 81 years old white male with metastatic non-small cell lung cancer initially diagnosed as stage IIIB non-small cell lung cancer status post concurrent chemoradiation followed by consolidation chemotherapy.  He had evidence for disease recurrence and the patient was started on systemic chemotherapy with carboplatin and Alimta discontinued secondary to disease progression. He is currently undergoing immunotherapy with single agent Nivolumab status post 8 cycles. This was discontinued after the patient had progressive disease in the lumbar spines and require surgical resection followed by palliative radiotherapy. The recent CT scan of the chest, abdomen and pelvis showed new and increased pulmonary nodularity, suspicious for progressive metastatic disease. There was also enlargement of thoracic nodes, including a low left mediastinal node and progression of bulky abdominal lymphadenopathy. The patient was started on treatment with immunotherapy with Nivolumab status post 8 cycles. He is tolerating the treatment well except for the persistent fatigue. Unfortunately the recent CT scan of the chest, abdomen and pelvis showed evidence for disease progression with enlargement of numerous pulmonary nodules in  addition to retroperitoneal, mesenteric and upper abdominal lymphadenopathy. I discussed the scan results and showed the images to the patient and his wife. I gave him the option of palliative care and hospice referral versus consideration of systemic chemotherapy likely with docetaxel and Cyramza. The patient would like some time to think about his option. He will call the office with his decision in the next few days. I will cancel all his chemotherapy and lab appointment for now until I hear from the patient. He was advised to call immediately if he has any concerning symptoms in the interval. The patient voices understanding of current disease status and treatment options and is in agreement with the current care plan. All questions were answered. The patient knows to call the clinic with any problems, questions or concerns. We can certainly see the patient much sooner if necessary.  Disclaimer: This note was dictated with voice recognition software. Similar sounding words can inadvertently be transcribed and may not be corrected upon review.

## 2015-11-19 NOTE — Telephone Encounter (Signed)
Per pof all appts cx

## 2015-11-21 IMAGING — CR DG LUMBAR SPINE 1V
1 series · 1 of 1 positions shown · non-contrast
Comparison: 05/24/2014

CLINICAL DATA: L5 lumbar laminectomy.

EXAM:
LUMBAR SPINE - 1 VIEW

[lat]
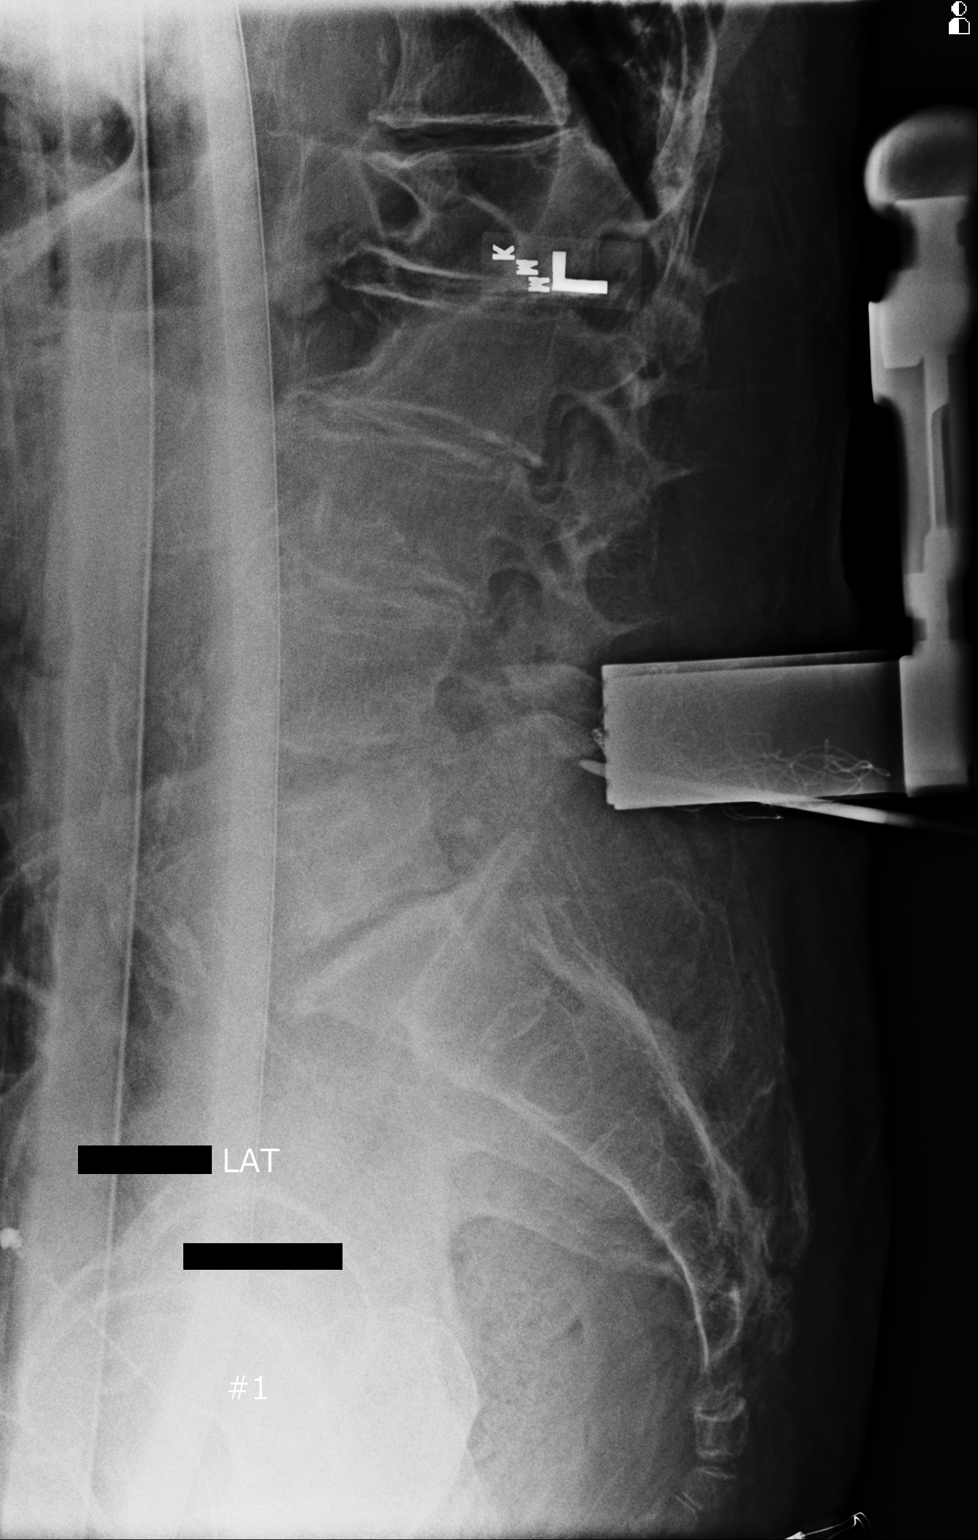

[1 of 1 positions shown; findings below may reference images not displayed]

FINDINGS: A single intraoperative lateral view of the lumbar spine is
submitted postoperatively for interpretation.

A posterior metallic probe overlies the L5 spinous process directed
towards the L4-5 interspace.
IMPRESSION: Localizer.

## 2015-12-03 ENCOUNTER — Ambulatory Visit: Payer: Medicare Other | Admitting: Internal Medicine

## 2015-12-03 ENCOUNTER — Other Ambulatory Visit: Payer: Medicare Other

## 2015-12-03 ENCOUNTER — Ambulatory Visit: Payer: Medicare Other

## 2016-02-02 IMAGING — CT CT ABD-PELV W/O CM
2 of 4 series · 15 of 46 positions shown, 17 images · non-contrast
Comparison: Chest CT 05/21/2014, CT of the chest, abdomen and
pelvis 03/07/2014 and PET-CT 04/23/2013. Lumbar MRI 05/24/2014.

CLINICAL DATA: Restaging non-small cell lung cancer post
chemotherapy completed 6 months ago. Persistent cough. Subsequent
encounter.

EXAM:
CT CHEST, ABDOMEN AND PELVIS WITHOUT CONTRAST
TECHNIQUE: Multidetector CT imaging of the chest, abdomen and pelvis was
performed following the standard protocol without IV contrast.

[Series 2: cap w/o w/o st · axial · non-contrast · 0.81mm/px · z∈[-644,-94]mm · 12 of 124 slices shown, 14 images]
[im 7/124  soft-tissue]
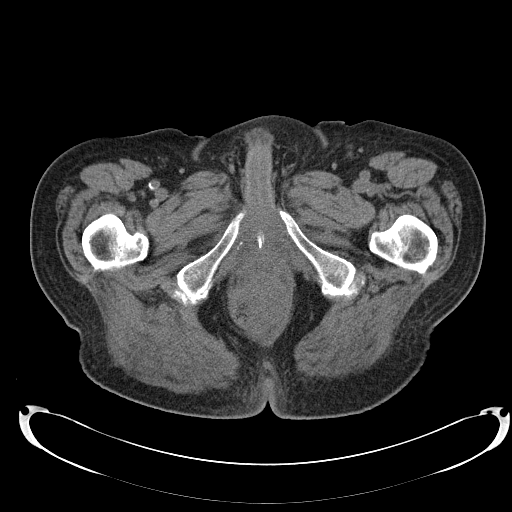
[im 7/124  bone]
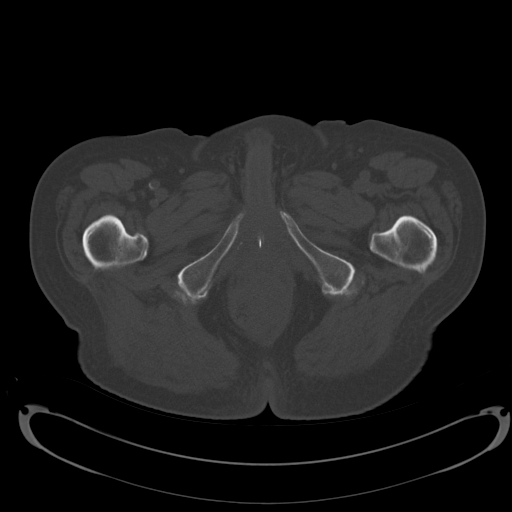
[im 19/124  soft-tissue]
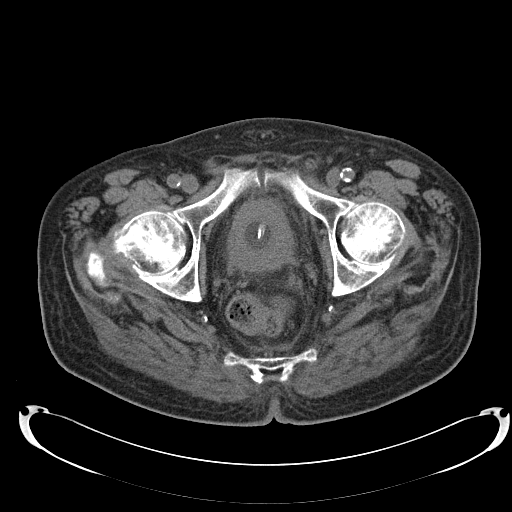
[im 25/124  soft-tissue]
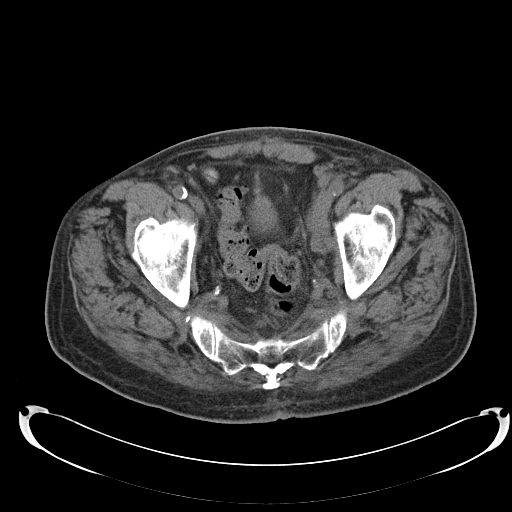
[im 37/124  soft-tissue]
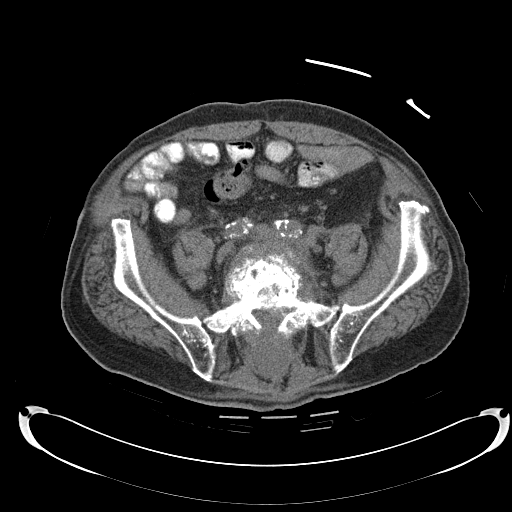
[im 50/124  soft-tissue]
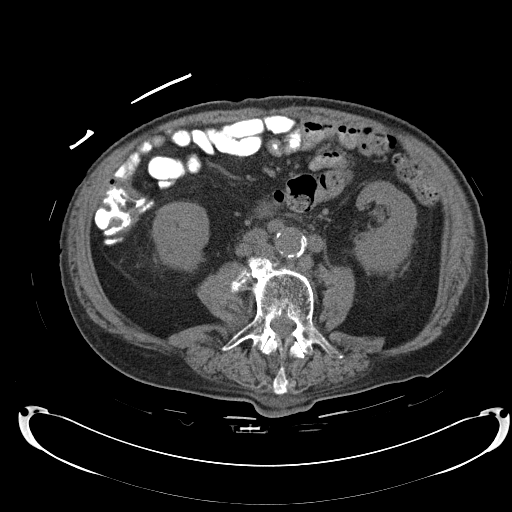
[im 56/124  soft-tissue]
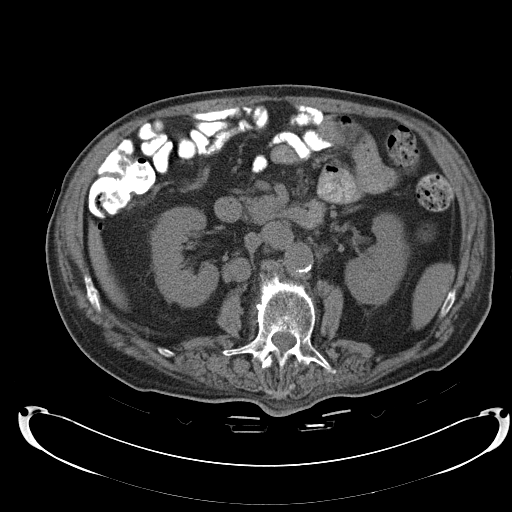
[im 68/124  soft-tissue]
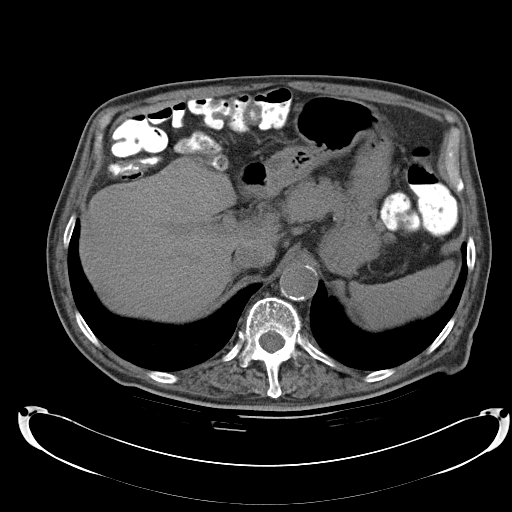
[im 74/124  soft-tissue]
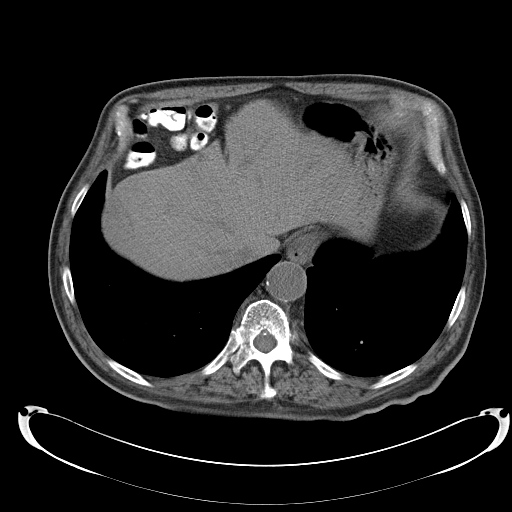
[im 87/124  soft-tissue]
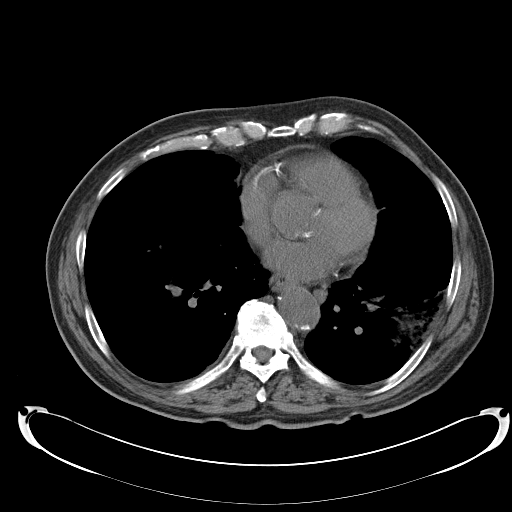
[im 87/124  bone]
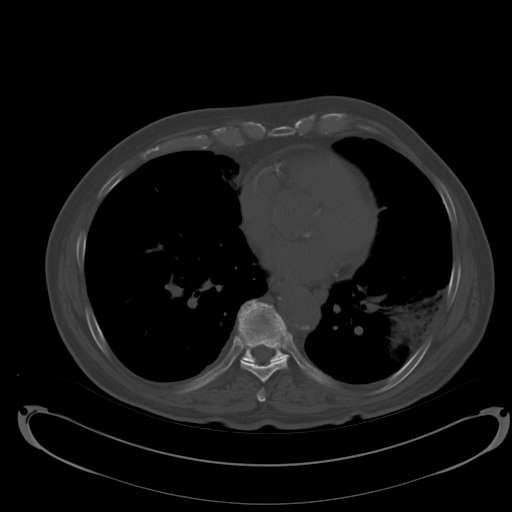
[im 99/124  soft-tissue]
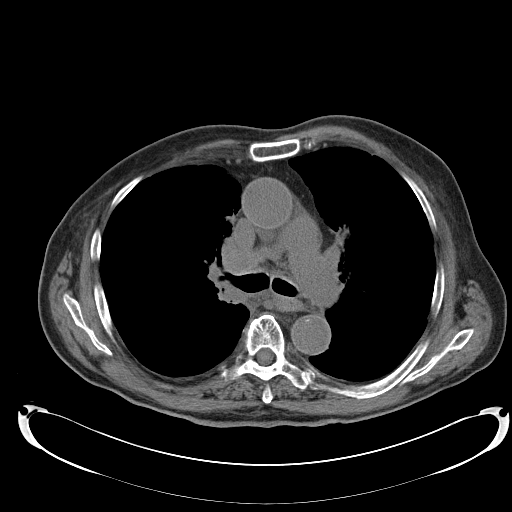
[im 105/124  soft-tissue]
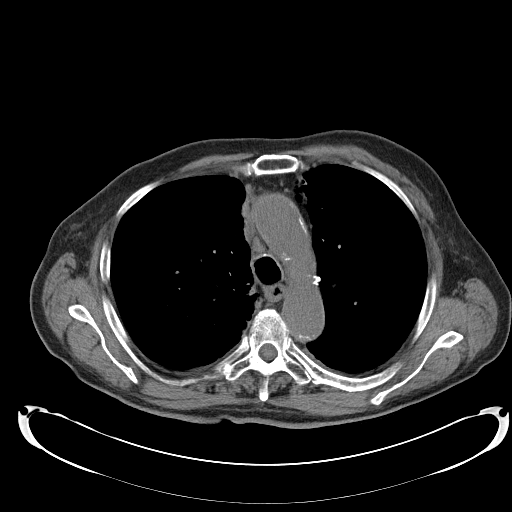
[im 117/124  soft-tissue]
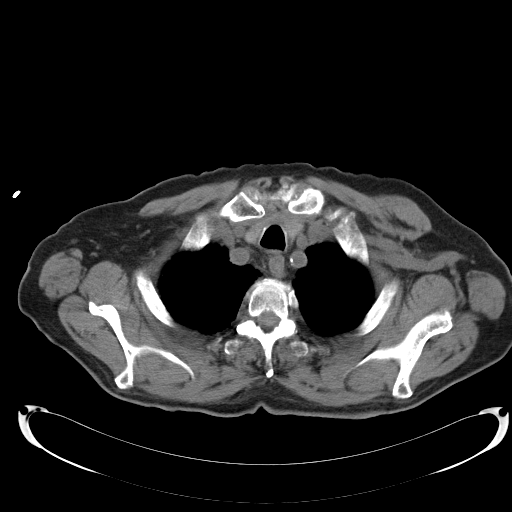

[Series 602: <mpr thick range> · coronal · 1.20mm/px · 3 of 96 slices shown]
[im 32/96  soft-tissue]
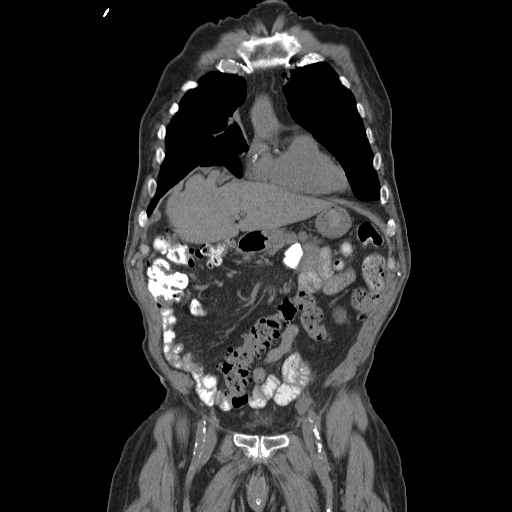
[im 43/96  soft-tissue]
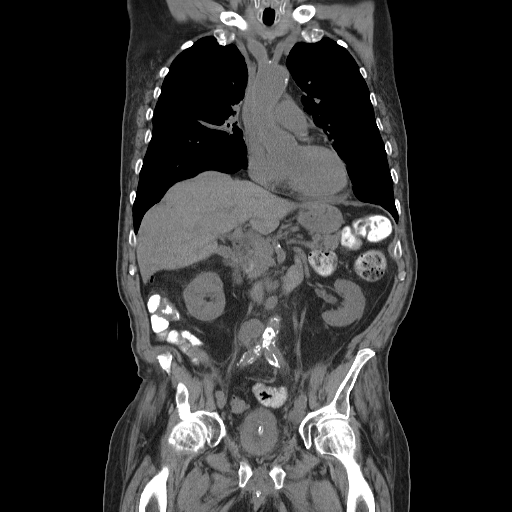
[im 53/96  soft-tissue]
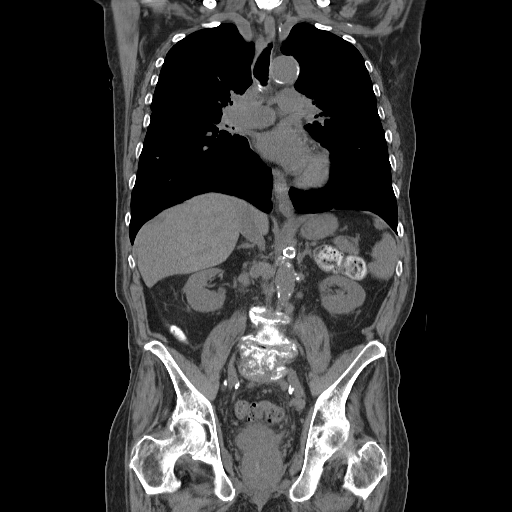

[15 of 46 positions shown; findings below may reference images not displayed]

FINDINGS: CT CHEST FINDINGS

Mediastinum/Nodes: Nodal evaluation limited by a lack of intravenous
contrast and presumed radiation changes in the right perihilar
region. Subcarinal node measuring 1.2 cm short axis on image 32
previously measured 1.4 cm. Right infrahilar and perihilar soft
tissue fullness appears unchanged. No progressive adenopathy
identified. The thyroid gland, trachea and esophagus demonstrate no
significant findings. The heart size is normal. Minimal pericardial
fluid versus thickening is stable.Stable atherosclerosis of the
aorta, great vessels and coronary arteries.

Lungs/Pleura: Minimal pleural fluid on the left.Stable perihilar
radiation changes bilaterally with hilar distortion and central
bronchial irregularity. There is stable associated scarring and
volume loss in the right middle lobe, superior segment of the left
lower lobe and lingula. 7 mm right lower lobe pulmonary nodule on
image 39 is unchanged. No new or enlarging nodules identified.

Musculoskeletal/Chest wall: No suspicious chest wall lesion or
osseous finding.

CT ABDOMEN AND PELVIS FINDINGS

Hepatobiliary: Scattered small low-density hepatic lesions are
grossly stable, most consistent with cysts. No new or enlarging
lesions identified. No evidence of gallstones, gallbladder wall
thickening or biliary dilatation.

Pancreas: Unremarkable. No pancreatic ductal dilatation or
surrounding inflammatory changes.

Spleen: Normal in size without focal abnormality.

Adrenals/Urinary Tract: Mild bilateral adrenal nodularity is stable.
Stable cyst posteriorly in the left kidney. No hydronephrosis or
asymmetric perinephric soft tissue stranding. No evidence of urinary
tract calculus. The urinary bladder is decompressed by a Foley
catheter. There is suspected bladder wall thickening and perivesical
soft tissue stranding.

Stomach/Bowel: No evidence of bowel wall thickening, distention or
surrounding inflammatory change.There are diverticular changes
throughout the distal colon. The appendix appears normal.

Vascular/Lymphatic: There is progressive enlargement of multiple
retroperitoneal and pelvic lymph nodes. Representative nodes
include: 1.5 cm aortic caval node on image 66, 1.9 cm right
paracaval node on image 69, a 2.0 cm aortic caval node on image 69
(previously 1.5 cm), a 1.2 cm left periaortic node on image 72 and a
3.8 x 2.0 cm left pelvic sidewall node on image 101 (previously
x 2.6 cm). Extensive atherosclerosis of the aorta and iliac arteries
is grossly stable.

Reproductive: Grossly stable moderate enlargement of the prostate
gland.

Other: No evidence of abdominal wall mass or hernia.

Musculoskeletal: There are interval surgical changes at L5 status
post decompressive laminectomy. There is mildly progressive
destruction of the L4 and L5 vertebral bodies with associated
biforaminal narrowing at L4-5 and L5-S1. There is nonspecific fluid
density within the laminectomy bed. No other osseous lesions
observed.
IMPRESSION: 1. The findings in the chest are stable with paramediastinal
radiation changes bilaterally and residual right hilar and
mediastinal lymphadenopathy.
2. Pulmonary parenchymal scarring and nodularity are unchanged.
3. Progressive destruction of the L4 and L5 vertebral bodies status
post decompressive laminectomy (adenocarcinoma at pathology).
4. Progressive retroperitoneal and pelvic adenopathy consistent with
metastatic disease.
5. Nonspecific bladder wall thickening and perivesical edema. This
may be secondary prior radiation therapy or cystitis.

## 2016-10-28 ENCOUNTER — Telehealth: Payer: Self-pay | Admitting: Medical Oncology

## 2016-10-28 NOTE — Telephone Encounter (Signed)
Records faxed to [facility].

## 2016-11-27 DEATH — deceased
